# Patient Record
Sex: Male | Born: 1960 | Race: Black or African American | Hispanic: No | Smoking: Heavy tobacco smoker
Health system: Southern US, Community
[De-identification: ages and names within clinical notes are randomized; demographics above are authoritative.]

## PROBLEM LIST (undated history)

## (undated) DIAGNOSIS — I219 Acute myocardial infarction, unspecified: Secondary | ICD-10-CM

## (undated) DIAGNOSIS — E785 Hyperlipidemia, unspecified: Secondary | ICD-10-CM

## (undated) DIAGNOSIS — I739 Peripheral vascular disease, unspecified: Secondary | ICD-10-CM

## (undated) DIAGNOSIS — B1920 Unspecified viral hepatitis C without hepatic coma: Secondary | ICD-10-CM

## (undated) DIAGNOSIS — I5020 Unspecified systolic (congestive) heart failure: Secondary | ICD-10-CM

## (undated) DIAGNOSIS — I10 Essential (primary) hypertension: Secondary | ICD-10-CM

## (undated) DIAGNOSIS — E119 Type 2 diabetes mellitus without complications: Secondary | ICD-10-CM

## (undated) HISTORY — PX: ABSCESS DRAINAGE: SHX1119

---

## 2017-08-30 ENCOUNTER — Emergency Department
Admission: EM | Admit: 2017-08-30 | Discharge: 2017-08-31 | Disposition: A | Discharge status: Home / Self Care | Payer: Medicaid Other | Attending: Emergency Medicine | Admitting: Emergency Medicine

## 2017-08-30 DIAGNOSIS — E119 Type 2 diabetes mellitus without complications: Secondary | ICD-10-CM | POA: Diagnosis not present

## 2017-08-30 DIAGNOSIS — T401X1A Poisoning by heroin, accidental (unintentional), initial encounter: Secondary | ICD-10-CM

## 2017-08-30 HISTORY — DX: Type 2 diabetes mellitus without complications: E11.9

## 2017-08-30 NOTE — ED Triage Notes (Signed)
Pt arrived to the ED via EMS from home for a heroin overdose. According to EMS they got to the Pt's house and they found the Pt unresponsive, after receiving Narcan the Pt became responsive. Upon arrival to the hospital the Pt admitted to Dr. Owens Shark that he "snorred heroin" pt is AOx4 somnolent.

## 2017-08-31 ENCOUNTER — Other Ambulatory Visit: Payer: Self-pay

## 2017-08-31 ENCOUNTER — Encounter: Payer: Self-pay | Admitting: Emergency Medicine

## 2017-08-31 LAB — COMPREHENSIVE METABOLIC PANEL
ALT: 20 U/L (ref 0–44)
AST: 32 U/L (ref 15–41)
Albumin: 4.4 g/dL (ref 3.5–5.0)
Alkaline Phosphatase: 170 U/L — ABNORMAL HIGH (ref 38–126)
Anion gap: 9 (ref 5–15)
BUN: 23 mg/dL — ABNORMAL HIGH (ref 6–20)
CO2: 23 mmol/L (ref 22–32)
Calcium: 8.9 mg/dL (ref 8.9–10.3)
Chloride: 102 mmol/L (ref 98–111)
Creatinine, Ser: 1.71 mg/dL — ABNORMAL HIGH (ref 0.61–1.24)
GFR calc Af Amer: 50 mL/min — ABNORMAL LOW (ref >60)
GFR calc non Af Amer: 43 mL/min — ABNORMAL LOW (ref >60)
Glucose, Bld: 548 mg/dL (ref 70–99)
Potassium: 5.9 mmol/L — ABNORMAL HIGH (ref 3.5–5.1)
Sodium: 134 mmol/L — ABNORMAL LOW (ref 135–145)
Total Bilirubin: 1 mg/dL (ref 0.3–1.2)
Total Protein: 9.3 g/dL — ABNORMAL HIGH (ref 6.5–8.1)

## 2017-08-31 LAB — CBC
HEMATOCRIT: 44.8 % (ref 40.0–52.0)
HEMOGLOBIN: 14.2 g/dL (ref 13.0–18.0)
MCH: 26.2 pg (ref 26.0–34.0)
MCHC: 31.7 g/dL — ABNORMAL LOW (ref 32.0–36.0)
MCV: 82.7 fL (ref 80.0–100.0)
Platelets: 247 10*3/uL (ref 150–440)
RBC: 5.43 MIL/uL (ref 4.40–5.90)
RDW: 15.7 % — ABNORMAL HIGH (ref 11.5–14.5)
WBC: 11.5 10*3/uL — ABNORMAL HIGH (ref 3.8–10.6)

## 2017-08-31 LAB — GLUCOSE, CAPILLARY
Glucose-Capillary: 345 mg/dL — ABNORMAL HIGH (ref 70–99)
Glucose-Capillary: 401 mg/dL — ABNORMAL HIGH (ref 70–99)
Glucose-Capillary: 541 mg/dL (ref 70–99)

## 2017-08-31 MED ORDER — ONDANSETRON HCL 4 MG/2ML IJ SOLN
INTRAMUSCULAR | Status: AC
  Filled 2017-08-31: qty 2

## 2017-08-31 MED ORDER — INSULIN ASPART 100 UNIT/ML ~~LOC~~ SOLN
10.0000 [IU] | Freq: Once | SUBCUTANEOUS | Status: AC
  Administered 2017-08-31: 10 [IU] via SUBCUTANEOUS
  Filled 2017-08-31: qty 1

## 2017-08-31 MED ORDER — NALOXONE HCL 2 MG/2ML IJ SOSY
PREFILLED_SYRINGE | INTRAMUSCULAR | Status: AC
  Filled 2017-08-31: qty 2

## 2017-08-31 MED ORDER — SODIUM CHLORIDE 0.9 % IV BOLUS
1000.0000 mL | Freq: Once | INTRAVENOUS | Status: AC
  Administered 2017-08-31: 1000 mL via INTRAVENOUS

## 2017-08-31 MED ORDER — ONDANSETRON HCL 4 MG/2ML IJ SOLN: 4.0000 mg | Freq: Once | INTRAMUSCULAR | Status: DC

## 2017-08-31 NOTE — ED Provider Notes (Signed)
[facility] Emergency Department Provider Note      I have reviewed the triage vital signs and the nursing notes.   HISTORY  Chief Complaint Drug Overdose    HPI Daniel Moon is a 57 y.o. male [name] to the emergency department via EMS.  EMS states that the call was for cardiac arrest on their arrival patient was found to be unresponsive laying supine with respiratory rate of 6 pinpoint pupils.  Patient however did have a regular pulse.  EMS administered intranasal Narcan.  Following Narcan administration patient became alert oriented.  Patient initially told EMS that he does not recall anything prior to losing consciousness stating that he just came home from work.  However during my evaluation patient admitted to snorting heroin after arriving home from work.  Past medical history Diabetes mellitus There are no active problems to display for this patient.     Prior to Admission medications   Not on File    Allergies No known No family history on file.  Social History Social History   Tobacco Use  . Smoking status: Not on file  Substance Use Topics  . Alcohol use: Not on file  . Drug use: Not on file    Review of Systems Constitutional: No fever/chills Eyes: No visual changes. ENT: No sore throat. Cardiovascular: Denies chest pain. Respiratory: Denies shortness of breath. Gastrointestinal: No abdominal pain.  No nausea, no vomiting.  No diarrhea.  No constipation. Genitourinary: Negative for dysuria. Musculoskeletal: Negative for neck pain.  Negative for back pain. Integumentary: Negative for rash. Neurological: Negative for headaches, focal weakness or numbness.   ____________________________________________   PHYSICAL EXAM:  VITAL SIGNS: ED Triage Vitals  Enc Vitals Group     BP      Pulse      Resp      Temp      Temp src      SpO2      Weight      Height      Head Circumference      Peak Flow      Pain  Score      Pain Loc      Pain Edu?      Excl. in [place]?     Constitutional: Alert and oriented. Well appearing and in no acute distress. Eyes: Conjunctivae are normal.  Point pupils bilaterally  head: Atraumatic. Mouth/Throat: Mucous membranes are moist. Oropharynx non-erythematous. Neck: No stridor.  No meningeal signs.   Cardiovascular: Normal rate, regular rhythm. Good peripheral circulation. Grossly normal heart sounds. Respiratory: Normal respiratory effort.  No retractions. Lungs CTAB. Gastrointestinal: Soft and nontender. No distention.  Musculoskeletal: No lower extremity tenderness nor edema. No gross deformities of extremities. Neurologic:  Normal speech and language. No gross focal neurologic deficits are appreciated.  Skin:  Skin is warm, dry and intact. No rash noted. Psychiatric: Mood and affect are normal. Speech and behavior are normal.  ____________________________________________   LABS (all labs ordered are listed, but only abnormal results are displayed)  Labs Reviewed  GLUCOSE, CAPILLARY - Abnormal; Notable for the following components:      Result Value   Glucose-Capillary 541 (*)    All other components within normal limits   ____________________________________________  EKG  ED ECG REPORT I, [place], the attending physician, personally viewed and interpreted this ECG.   Date: 08/31/2017  EKG Time: 50 9 PM  Rate: 85  Rhythm: Normal sinus rhythm  Axis: Normal  Intervals: Normal  ST&T Change: None   Procedures   ____________________________________________   INITIAL IMPRESSION / ASSESSMENT AND PLAN / ED COURSE  As part of my medical decision making, I reviewed the following data within the electronic MEDICAL RECORD NUMBER   57 year old male presented with above-stated history and physical exam secondary to accidental heroin overdose.  Patient was given Narcan by EMS before arrival to the emergency department as stated.  Patient remained  stable throughout 6-hour ED observation with no episodes of hypoxia.  Patient is alert and oriented x4 at this time.  Spoke with the patient at length regarding detox which the patient refused at this time.    ____________________________________________  FINAL CLINICAL IMPRESSION(S) / ED DIAGNOSES  Final diagnoses:  Accidental overdose of heroin, initial encounter [facility])     MEDICATIONS GIVEN DURING THIS VISIT:  Medications - No data to display   ED Discharge Orders    None       Note:  This document was prepared using Dragon voice recognition software and may include unintentional dictation errors.    Gregor Hams, MD 08/31/17 [phone]

## 2018-02-22 ENCOUNTER — Emergency Department: Payer: Medicaid Other

## 2018-02-22 ENCOUNTER — Inpatient Hospital Stay
Admission: EM | Admit: 2018-02-22 | Discharge: 2018-02-24 | DRG: 193 | Disposition: A | Discharge status: Home / Self Care | Payer: Medicaid Other | Attending: Internal Medicine | Admitting: Internal Medicine

## 2018-02-22 ENCOUNTER — Other Ambulatory Visit: Payer: Self-pay

## 2018-02-22 DIAGNOSIS — E119 Type 2 diabetes mellitus without complications: Secondary | ICD-10-CM

## 2018-02-22 DIAGNOSIS — J188 Other pneumonia, unspecified organism: Principal | ICD-10-CM | POA: Diagnosis present

## 2018-02-22 DIAGNOSIS — I110 Hypertensive heart disease with heart failure: Secondary | ICD-10-CM | POA: Diagnosis present

## 2018-02-22 DIAGNOSIS — J440 Chronic obstructive pulmonary disease with acute lower respiratory infection: Secondary | ICD-10-CM | POA: Diagnosis present

## 2018-02-22 DIAGNOSIS — J449 Chronic obstructive pulmonary disease, unspecified: Secondary | ICD-10-CM | POA: Diagnosis present

## 2018-02-22 DIAGNOSIS — R7989 Other specified abnormal findings of blood chemistry: Secondary | ICD-10-CM

## 2018-02-22 DIAGNOSIS — E871 Hypo-osmolality and hyponatremia: Secondary | ICD-10-CM | POA: Diagnosis present

## 2018-02-22 DIAGNOSIS — Z833 Family history of diabetes mellitus: Secondary | ICD-10-CM

## 2018-02-22 DIAGNOSIS — N170 Acute kidney failure with tubular necrosis: Secondary | ICD-10-CM | POA: Diagnosis present

## 2018-02-22 DIAGNOSIS — E875 Hyperkalemia: Secondary | ICD-10-CM | POA: Diagnosis not present

## 2018-02-22 DIAGNOSIS — R0902 Hypoxemia: Secondary | ICD-10-CM

## 2018-02-22 DIAGNOSIS — J9602 Acute respiratory failure with hypercapnia: Secondary | ICD-10-CM | POA: Diagnosis present

## 2018-02-22 DIAGNOSIS — Z794 Long term (current) use of insulin: Secondary | ICD-10-CM

## 2018-02-22 DIAGNOSIS — I252 Old myocardial infarction: Secondary | ICD-10-CM

## 2018-02-22 DIAGNOSIS — I10 Essential (primary) hypertension: Secondary | ICD-10-CM | POA: Diagnosis present

## 2018-02-22 DIAGNOSIS — E785 Hyperlipidemia, unspecified: Secondary | ICD-10-CM | POA: Diagnosis present

## 2018-02-22 DIAGNOSIS — J189 Pneumonia, unspecified organism: Secondary | ICD-10-CM

## 2018-02-22 DIAGNOSIS — Z23 Encounter for immunization: Secondary | ICD-10-CM

## 2018-02-22 DIAGNOSIS — Z8249 Family history of ischemic heart disease and other diseases of the circulatory system: Secondary | ICD-10-CM

## 2018-02-22 DIAGNOSIS — Z79899 Other long term (current) drug therapy: Secondary | ICD-10-CM

## 2018-02-22 DIAGNOSIS — E1165 Type 2 diabetes mellitus with hyperglycemia: Secondary | ICD-10-CM | POA: Diagnosis present

## 2018-02-22 DIAGNOSIS — F17210 Nicotine dependence, cigarettes, uncomplicated: Secondary | ICD-10-CM | POA: Diagnosis present

## 2018-02-22 DIAGNOSIS — J441 Chronic obstructive pulmonary disease with (acute) exacerbation: Secondary | ICD-10-CM | POA: Diagnosis present

## 2018-02-22 DIAGNOSIS — E872 Acidosis: Secondary | ICD-10-CM | POA: Diagnosis present

## 2018-02-22 DIAGNOSIS — I5022 Chronic systolic (congestive) heart failure: Secondary | ICD-10-CM | POA: Diagnosis present

## 2018-02-22 DIAGNOSIS — J9601 Acute respiratory failure with hypoxia: Secondary | ICD-10-CM | POA: Diagnosis present

## 2018-02-22 HISTORY — DX: Unspecified systolic (congestive) heart failure: I50.20

## 2018-02-22 HISTORY — DX: Hyperlipidemia, unspecified: E78.5

## 2018-02-22 HISTORY — DX: Acute myocardial infarction, unspecified: I21.9

## 2018-02-22 HISTORY — DX: Essential (primary) hypertension: I10

## 2018-02-22 LAB — BASIC METABOLIC PANEL
ANION GAP: 8 (ref 5–15)
BUN: 18 mg/dL (ref 6–20)
CO2: 20 mmol/L — AB (ref 22–32)
Calcium: 8.4 mg/dL — ABNORMAL LOW (ref 8.9–10.3)
Chloride: 107 mmol/L (ref 98–111)
Creatinine, Ser: 1.55 mg/dL — ABNORMAL HIGH (ref 0.61–1.24)
GFR calc Af Amer: 57 mL/min — ABNORMAL LOW (ref >60)
GFR calc non Af Amer: 49 mL/min — ABNORMAL LOW (ref >60)
Glucose, Bld: 169 mg/dL — ABNORMAL HIGH (ref 70–99)
POTASSIUM: 4.9 mmol/L (ref 3.5–5.1)
Sodium: 135 mmol/L (ref 135–145)

## 2018-02-22 LAB — BLOOD GAS, VENOUS
Acid-base deficit: 2.2 mmol/L — ABNORMAL HIGH (ref 0.0–2.0)
Bicarbonate: 22.4 mmol/L (ref 20.0–28.0)
O2 Saturation: 74.3 %
Patient temperature: 37
pCO2, Ven: 37 mmHg — ABNORMAL LOW (ref 44.0–60.0)
pH, Ven: 7.39 (ref 7.250–7.430)
pO2, Ven: 40 mmHg (ref 32.0–45.0)

## 2018-02-22 LAB — CBC
HEMATOCRIT: 38.5 % — AB (ref 39.0–52.0)
HEMOGLOBIN: 12.4 g/dL — AB (ref 13.0–17.0)
MCH: 25.7 pg — AB (ref 26.0–34.0)
MCHC: 32.2 g/dL (ref 30.0–36.0)
MCV: 79.9 fL — AB (ref 80.0–100.0)
Platelets: 267 10*3/uL (ref 150–400)
RBC: 4.82 MIL/uL (ref 4.22–5.81)
RDW: 14.4 % (ref 11.5–15.5)
WBC: 8.9 10*3/uL (ref 4.0–10.5)
nRBC: 0 % (ref 0.0–0.2)

## 2018-02-22 LAB — BRAIN NATRIURETIC PEPTIDE: B Natriuretic Peptide: 592 pg/mL — ABNORMAL HIGH (ref 0.0–100.0)

## 2018-02-22 LAB — TROPONIN I: Troponin I: <0.03 ng/mL (ref <0.03)

## 2018-02-22 LAB — LACTIC ACID, PLASMA: LACTIC ACID, VENOUS: 1.3 mmol/L (ref 0.5–1.9)

## 2018-02-22 LAB — INFLUENZA PANEL BY PCR (TYPE A & B)
INFLBPCR: NEGATIVE
Influenza A By PCR: NEGATIVE

## 2018-02-22 MED ORDER — SODIUM CHLORIDE 0.9 % IV SOLN
1.0000 g | Freq: Once | INTRAVENOUS | Status: AC
  Administered 2018-02-22: 1 g via INTRAVENOUS
  Filled 2018-02-22: qty 10

## 2018-02-22 MED ORDER — KETOROLAC TROMETHAMINE 30 MG/ML IJ SOLN
30.0000 mg | Freq: Once | INTRAMUSCULAR | Status: AC
  Administered 2018-02-22: 30 mg via INTRAVENOUS
  Filled 2018-02-22: qty 1

## 2018-02-22 MED ORDER — METHYLPREDNISOLONE SODIUM SUCC 125 MG IJ SOLR
125.0000 mg | Freq: Once | INTRAMUSCULAR | Status: AC
  Administered 2018-02-22: 125 mg via INTRAVENOUS
  Filled 2018-02-22: qty 2

## 2018-02-22 MED ORDER — SODIUM CHLORIDE 0.9 % IV BOLUS
1000.0000 mL | Freq: Once | INTRAVENOUS | Status: AC
  Administered 2018-02-23: 1000 mL via INTRAVENOUS

## 2018-02-22 MED ORDER — SODIUM CHLORIDE 0.9 % IV BOLUS
1000.0000 mL | Freq: Once | INTRAVENOUS | Status: AC
  Administered 2018-02-22: 1000 mL via INTRAVENOUS

## 2018-02-22 MED ORDER — ONDANSETRON HCL 4 MG/2ML IJ SOLN
4.0000 mg | Freq: Once | INTRAMUSCULAR | Status: AC
  Administered 2018-02-22: 4 mg via INTRAVENOUS
  Filled 2018-02-22: qty 2

## 2018-02-22 MED ORDER — SODIUM CHLORIDE 0.9 % IV SOLN
500.0000 mg | Freq: Once | INTRAVENOUS | Status: AC
  Administered 2018-02-23: 500 mg via INTRAVENOUS
  Filled 2018-02-22 (×3): qty 500

## 2018-02-22 MED ORDER — IPRATROPIUM-ALBUTEROL 0.5-2.5 (3) MG/3ML IN SOLN
3.0000 mL | Freq: Once | RESPIRATORY_TRACT | Status: AC
  Administered 2018-02-22: 3 mL via RESPIRATORY_TRACT
  Filled 2018-02-22: qty 3

## 2018-02-22 NOTE — ED Notes (Signed)
Pt brought in via ems from home with cough, sob, n/v.  Sx since yesterday.  cig smoker.  No abd pain.  Pt denies chest pain.  No diaphoresis.  Pt reports aching all over.  Family with pt.

## 2018-02-22 NOTE — ED Triage Notes (Signed)
Pt arrived via [place] [facility] from home with c/o N/V/D and cough since yesterday. EMS states that pt had some increasing SHOB today with o2 on RA of 91%. EMS states that pt has been anxious and worried about breathing. Pt on 92% on RA on arrival put on 2L [place].

## 2018-02-22 NOTE — ED Notes (Signed)
Ambulated with oxygen sats in 80's on room air.

## 2018-02-22 NOTE — ED Notes (Signed)
Iv started and meds given.  Pt was restless and yelling out in pain.  Pt became diaphoretic.  md aware.  Oxygen sats low on 3 liters at 81%  md aware.

## 2018-02-22 NOTE — ED Provider Notes (Signed)
-----------------------------------------   11:51 PM on 02/22/2018 -----------------------------------------  On nasal cannula oxygen patient's oxygenation is 81%.  Will place on BiPAP.   ----------------------------------------- 5:48 AM on 02/23/2018 -----------------------------------------  Patient remains in the ED, boarded secondary to no stepdown beds.  No further events.  Doing well on BiPAP.    Paulette Blanch, MD 02/23/18 [phone]

## 2018-02-22 NOTE — ED Provider Notes (Signed)
[facility] Emergency Department Provider Note  ____________________________________________  Time seen: Approximately 10:56 PM  I have reviewed the triage vital signs and the nursing notes.   HISTORY  Chief Complaint Emesis; Nausea; Cough; and Shortness of Breath    HPI Marky Buresh is a 58 y.o. male with DM and tobacco abuse presenting w/ cough and SOB.  Patient reports that for the past 3 days he has had a dry cough with congestion and rhinorrhea, and his most bothered by "the pain in my bones."  He reports that he becomes quite dyspneic with exertion.  He denies any chest pain, lightheadedness or fainting, fever.  He did not have his influenza vaccination this year, and has had some episodes of nausea and vomiting with diarrhea.  Past Medical History:  Diagnosis Date  . Diabetes mellitus without complication ([place])     There are no active problems to display for this patient.   History reviewed. No pertinent surgical history.  Current Outpatient Rx  . Order #: [id number] Class: Historical Med  . Order #: [id number] Class: Historical Med  . Order #: [id number] Class: Historical Med  . Order #: [id number] Class: Historical Med  . Order #: [id number] Class: Historical Med  . Order #: [id number] Class: Historical Med  . Order #: [id number] Class: Historical Med  . Order #: [id number] Class: Historical Med  . Order #: [id number] Class: Historical Med  . Order #: [id number] Class: Historical Med    Allergies Patient has no known allergies.  History reviewed. No pertinent family history.  Social History Social History   Tobacco Use  . Smoking status: Heavy Tobacco Smoker  . Smokeless tobacco: Never Used  Substance Use Topics  . Alcohol use: Yes  . Drug use: Yes    Comment: Heroin    Review of Systems Constitutional: No fever/chills.  Lightheadedness or syncope.  Positive myalgias. Eyes: No visual changes.  No eye discharge. ENT: No sore throat.  +congestion +rhinorrhea. Cardiovascular: Denies chest pain. Denies palpitations. Respiratory: Positive exertional shortness of breath.  Positive cough. Gastrointestinal: No abdominal pain.  Positive nausea, positive vomiting.  Positive diarrhea.  No constipation. Genitourinary: Negative for dysuria. Musculoskeletal: Negative for back pain. Skin: Negative for rash. Neurological: Negative for headaches. No focal numbness, tingling or weakness.     ____________________________________________   PHYSICAL EXAM:  VITAL SIGNS: ED Triage Vitals  Enc Vitals Group     BP 02/22/18 2154 (!) 138/98     Pulse Rate 02/22/18 2154 87     Resp 02/22/18 2158 (!) 26     Temp 02/22/18 2158 98.4 F (36.9 C)     Temp Source 02/22/18 2158 Oral     SpO2 02/22/18 2154 92 %     Weight 02/22/18 2159 290 lb (131.5 kg)     Height 02/22/18 2159 6\' 3"  (1.905 m)     Head Circumference --      Peak Flow --      Pain Score 02/22/18 2159 10     Pain Loc --      Pain Edu? --      Excl. in [place]? --     Constitutional: Alert and oriented.  Answers questions appropriately. Uncomfortable appearing. Eyes: Conjunctivae are normal.  EOMI. No scleral icterus. Head: Atraumatic. Nose: +congestion without active rhinnorhea. Mouth/Throat: Mucous membranes are moist.  Neck: No stridor.  Supple.  No JVD.  No meningismus. Cardiovascular: Normal rate, regular rhythm. No murmurs, rubs or gallops.  Respiratory: The patient is tachypneic with mild accessory muscle use but no  retractions.  He is able to speak in full sentences without significant difficulty.  He has rales in the lung bases bilaterally and end expiratory wheezing.  His O2 sats are 95% on 2 L nasal cannula, but go down to 88% with ambulation off of oxygen. Gastrointestinal: Soft, nontender and nondistended.  No guarding or rebound.  No peritoneal signs. Musculoskeletal: No LE edema. No ttp in the calves or palpable cords.  Negative Homan's sign. Neurologic:   A&Ox3.  Speech is clear.  Face and smile are symmetric.  EOMI.  Moves all extremities well. Skin:  Skin is warm, dry and intact. No rash noted. Psychiatric: Mood and affect are normal. Speech and behavior are normal.  Normal judgement.  ____________________________________________   LABS (all labs ordered are listed, but only abnormal results are displayed)  Labs Reviewed  CBC - Abnormal; Notable for the following components:      Result Value   Hemoglobin 12.4 (*)    HCT 38.5 (*)    MCV 79.9 (*)    MCH 25.7 (*)    All other components within normal limits  CULTURE, BLOOD (ROUTINE X 2)  CULTURE, BLOOD (ROUTINE X 2)  BASIC METABOLIC PANEL  TROPONIN I  BRAIN NATRIURETIC PEPTIDE  INFLUENZA PANEL BY PCR (TYPE A & B)  BLOOD GAS, VENOUS  LACTIC ACID, PLASMA  LACTIC ACID, PLASMA   ____________________________________________  EKG  ED ECG REPORT I, Anne-Caroline Mariea Clonts, the attending physician, personally viewed and interpreted this ECG.   Date: 02/22/2018  EKG Time: 2159  Rate: 85  Rhythm: normal sinus rhythm  Axis: normal  Intervals:none  ST&T Change: No STEMI  ____________________________________________  RADIOLOGY  Dg Chest 2 View  Result Date: 02/22/2018 CLINICAL DATA:  58 year old male with increasing shortness of breath, cough. EXAM: CHEST - 2 VIEW COMPARISON:  Chest radiographs 09/30/2012. FINDINGS: Abnormal opacity in the left lung appears to be multifocal, both about the left hilum (rounded contour) and in a peribronchial pattern of the left lower lobe. Lower lung volumes. More subtle increased opacity about the right hilum appears more indeterminate, might be atelectasis. No definite pleural effusion. No pneumothorax. No pulmonary edema suspected. Mediastinal contours remain normal. Visualized tracheal air column is within normal limits. No acute osseous abnormality identified. Negative visible bowel gas pattern. IMPRESSION: 1. Multifocal opacity in the left lung,  configuration favoring multilobar pneumonia. Questionable involvement also in the right lung. No pleural effusion identified. 2. Followup PA and lateral chest X-ray is recommended in 3-4 weeks following trial of antibiotic therapy to ensure resolution and exclude underlying malignancy. Electronically Signed   By: Genevie Ann M.D.   On: 02/22/2018 22:41    ____________________________________________   PROCEDURES  Procedure(s) performed: None  Procedures  Critical Care performed: No ____________________________________________   INITIAL IMPRESSION / ASSESSMENT AND PLAN / ED COURSE  Pertinent labs & imaging results that were available during my care of the patient were reviewed by me and considered in my medical decision making (see chart for details).  58 y.o. male DM and tobacco abuse presenting for shortness of breath with cough, myalgias, nausea vomiting and diarrhea.  Overall, the patient symptoms are most consistent with a influenza-like illness, but given his abnormal pulmonary exam I am concerned about pneumonia.  He is hypoxic and will need admission.  The patient will receive immediate empiric antibiotics.  He is stable on supplemental oxygen.  CRITICAL CARE Performed by: Eula Listen   Total critical care time: 35 minutes  Critical care time was  exclusive of separately billable procedures and treating other patients.  Critical care was necessary to treat or prevent imminent or life-threatening deterioration.  Critical care was time spent personally by me on the following activities: development of treatment plan with patient and/or surrogate as well as nursing, discussions with consultants, evaluation of patient's response to treatment, examination of patient, obtaining history from patient or surrogate, ordering and performing treatments and interventions, ordering and review of laboratory studies, ordering and review of radiographic studies, pulse oximetry and  re-evaluation of patient's condition.   ____________________________________________  FINAL CLINICAL IMPRESSION(S) / ED DIAGNOSES  Final diagnoses:  Hypoxia  Multifocal pneumonia         NEW MEDICATIONS STARTED DURING THIS VISIT:  New Prescriptions   No medications on file      Eula Listen, MD 02/22/18 2303

## 2018-02-23 ENCOUNTER — Encounter: Payer: Self-pay | Admitting: Internal Medicine

## 2018-02-23 DIAGNOSIS — J188 Other pneumonia, unspecified organism: Secondary | ICD-10-CM | POA: Diagnosis present

## 2018-02-23 DIAGNOSIS — R0902 Hypoxemia: Secondary | ICD-10-CM | POA: Diagnosis present

## 2018-02-23 DIAGNOSIS — I110 Hypertensive heart disease with heart failure: Secondary | ICD-10-CM | POA: Diagnosis present

## 2018-02-23 DIAGNOSIS — E875 Hyperkalemia: Secondary | ICD-10-CM | POA: Diagnosis not present

## 2018-02-23 DIAGNOSIS — I5022 Chronic systolic (congestive) heart failure: Secondary | ICD-10-CM | POA: Diagnosis present

## 2018-02-23 DIAGNOSIS — Z794 Long term (current) use of insulin: Secondary | ICD-10-CM | POA: Diagnosis not present

## 2018-02-23 DIAGNOSIS — F17210 Nicotine dependence, cigarettes, uncomplicated: Secondary | ICD-10-CM | POA: Diagnosis present

## 2018-02-23 DIAGNOSIS — Z79899 Other long term (current) drug therapy: Secondary | ICD-10-CM | POA: Diagnosis not present

## 2018-02-23 DIAGNOSIS — I10 Essential (primary) hypertension: Secondary | ICD-10-CM | POA: Diagnosis present

## 2018-02-23 DIAGNOSIS — N170 Acute kidney failure with tubular necrosis: Secondary | ICD-10-CM | POA: Diagnosis present

## 2018-02-23 DIAGNOSIS — J9601 Acute respiratory failure with hypoxia: Secondary | ICD-10-CM | POA: Diagnosis present

## 2018-02-23 DIAGNOSIS — J189 Pneumonia, unspecified organism: Secondary | ICD-10-CM | POA: Diagnosis present

## 2018-02-23 DIAGNOSIS — E785 Hyperlipidemia, unspecified: Secondary | ICD-10-CM | POA: Diagnosis present

## 2018-02-23 DIAGNOSIS — J9602 Acute respiratory failure with hypercapnia: Secondary | ICD-10-CM | POA: Diagnosis present

## 2018-02-23 DIAGNOSIS — Z23 Encounter for immunization: Secondary | ICD-10-CM | POA: Diagnosis not present

## 2018-02-23 DIAGNOSIS — J441 Chronic obstructive pulmonary disease with (acute) exacerbation: Secondary | ICD-10-CM | POA: Diagnosis present

## 2018-02-23 DIAGNOSIS — I252 Old myocardial infarction: Secondary | ICD-10-CM | POA: Diagnosis not present

## 2018-02-23 DIAGNOSIS — Z833 Family history of diabetes mellitus: Secondary | ICD-10-CM | POA: Diagnosis not present

## 2018-02-23 DIAGNOSIS — J449 Chronic obstructive pulmonary disease, unspecified: Secondary | ICD-10-CM | POA: Diagnosis present

## 2018-02-23 DIAGNOSIS — J440 Chronic obstructive pulmonary disease with acute lower respiratory infection: Secondary | ICD-10-CM | POA: Diagnosis present

## 2018-02-23 DIAGNOSIS — E872 Acidosis: Secondary | ICD-10-CM | POA: Diagnosis present

## 2018-02-23 DIAGNOSIS — Z8249 Family history of ischemic heart disease and other diseases of the circulatory system: Secondary | ICD-10-CM | POA: Diagnosis not present

## 2018-02-23 DIAGNOSIS — E1165 Type 2 diabetes mellitus with hyperglycemia: Secondary | ICD-10-CM | POA: Diagnosis present

## 2018-02-23 DIAGNOSIS — E871 Hypo-osmolality and hyponatremia: Secondary | ICD-10-CM | POA: Diagnosis present

## 2018-02-23 DIAGNOSIS — E119 Type 2 diabetes mellitus without complications: Secondary | ICD-10-CM

## 2018-02-23 LAB — CBC
HEMATOCRIT: 39 % (ref 39.0–52.0)
Hemoglobin: 12.4 g/dL — ABNORMAL LOW (ref 13.0–17.0)
MCH: 25.6 pg — ABNORMAL LOW (ref 26.0–34.0)
MCHC: 31.8 g/dL (ref 30.0–36.0)
MCV: 80.4 fL (ref 80.0–100.0)
Platelets: 271 10*3/uL (ref 150–400)
RBC: 4.85 MIL/uL (ref 4.22–5.81)
RDW: 14.2 % (ref 11.5–15.5)
WBC: 8.4 10*3/uL (ref 4.0–10.5)
nRBC: 0 % (ref 0.0–0.2)

## 2018-02-23 LAB — BASIC METABOLIC PANEL
Anion gap: 8 (ref 5–15)
BUN: 21 mg/dL — ABNORMAL HIGH (ref 6–20)
CO2: 20 mmol/L — AB (ref 22–32)
Calcium: 8 mg/dL — ABNORMAL LOW (ref 8.9–10.3)
Chloride: 105 mmol/L (ref 98–111)
Creatinine, Ser: 1.71 mg/dL — ABNORMAL HIGH (ref 0.61–1.24)
GFR calc Af Amer: 51 mL/min — ABNORMAL LOW (ref >60)
GFR calc non Af Amer: 44 mL/min — ABNORMAL LOW (ref >60)
Glucose, Bld: 295 mg/dL — ABNORMAL HIGH (ref 70–99)
Potassium: 5.5 mmol/L — ABNORMAL HIGH (ref 3.5–5.1)
Sodium: 133 mmol/L — ABNORMAL LOW (ref 135–145)

## 2018-02-23 LAB — GLUCOSE, CAPILLARY
GLUCOSE-CAPILLARY: 232 mg/dL — AB (ref 70–99)
Glucose-Capillary: 272 mg/dL — ABNORMAL HIGH (ref 70–99)
Glucose-Capillary: 268 mg/dL — ABNORMAL HIGH (ref 70–99)
Glucose-Capillary: 323 mg/dL — ABNORMAL HIGH (ref 70–99)
Glucose-Capillary: 294 mg/dL — ABNORMAL HIGH (ref 70–99)
Glucose-Capillary: 232 mg/dL — ABNORMAL HIGH (ref 70–99)

## 2018-02-23 LAB — BLOOD GAS, ARTERIAL
Acid-base deficit: 2.9 mmol/L — ABNORMAL HIGH (ref 0.0–2.0)
BICARBONATE: 22.6 mmol/L (ref 20.0–28.0)
Delivery systems: POSITIVE
Expiratory PAP: 6
FIO2: 0.4
INSPIRATORY PAP: 12
O2 Saturation: 90.1 %
PO2 ART: 62 mmHg — AB (ref 83.0–108.0)
Patient temperature: 37
pCO2 arterial: 41 mmHg (ref 32.0–48.0)
pH, Arterial: 7.35 (ref 7.350–7.450)

## 2018-02-23 LAB — PROCALCITONIN

## 2018-02-23 LAB — POTASSIUM: Potassium: 4.7 mmol/L (ref 3.5–5.1)

## 2018-02-23 LAB — MRSA PCR SCREENING: MRSA by PCR: NEGATIVE

## 2018-02-23 LAB — LACTIC ACID, PLASMA: Lactic Acid, Venous: 1.2 mmol/L (ref 0.5–1.9)

## 2018-02-23 MED ORDER — BENZONATATE 100 MG PO CAPS: 200.0000 mg | ORAL_CAPSULE | Freq: Three times a day (TID) | ORAL | Status: DC | PRN

## 2018-02-23 MED ORDER — CARVEDILOL 3.125 MG PO TABS
12.5000 mg | ORAL_TABLET | Freq: Two times a day (BID) | ORAL | Status: DC
  Administered 2018-02-24: 08:00:00 12.5 mg via ORAL
  Filled 2018-02-23: qty 4

## 2018-02-23 MED ORDER — CHLORHEXIDINE GLUCONATE 0.12 % MT SOLN
15.0000 mL | Freq: Two times a day (BID) | OROMUCOSAL | Status: DC
  Administered 2018-02-23 (×2): 15 mL via OROMUCOSAL
  Filled 2018-02-23 (×2): qty 15

## 2018-02-23 MED ORDER — SODIUM ZIRCONIUM CYCLOSILICATE 5 G PO PACK
10.0000 g | PACK | Freq: Once | ORAL | Status: AC
  Administered 2018-02-23: 10 g via ORAL
  Filled 2018-02-23: qty 2

## 2018-02-23 MED ORDER — GUAIFENESIN-DM 100-10 MG/5ML PO SYRP
5.0000 mL | ORAL_SOLUTION | ORAL | Status: DC | PRN
  Administered 2018-02-23: 22:00:00 5 mL via ORAL
  Filled 2018-02-23 (×2): qty 5

## 2018-02-23 MED ORDER — AZITHROMYCIN 500 MG PO TABS
250.0000 mg | ORAL_TABLET | Freq: Every day | ORAL | Status: DC
  Administered 2018-02-24: 250 mg via ORAL
  Filled 2018-02-23: qty 1

## 2018-02-23 MED ORDER — SODIUM CHLORIDE 0.9 % IV SOLN
1.0000 g | INTRAVENOUS | Status: DC
  Administered 2018-02-23: 18:00:00 1 g via INTRAVENOUS
  Filled 2018-02-23: qty 1
  Filled 2018-02-23: qty 10

## 2018-02-23 MED ORDER — SODIUM CHLORIDE 0.9 % IV SOLN
1.0000 g | INTRAVENOUS | Status: DC
  Filled 2018-02-23: qty 10

## 2018-02-23 MED ORDER — SODIUM CHLORIDE 0.9 % IV SOLN
500.0000 mg | INTRAVENOUS | Status: DC
  Filled 2018-02-23: qty 500

## 2018-02-23 MED ORDER — INSULIN ASPART 100 UNIT/ML ~~LOC~~ SOLN
0.0000 [IU] | Freq: Three times a day (TID) | SUBCUTANEOUS | Status: DC
  Administered 2018-02-23: 17:00:00 8 [IU] via SUBCUTANEOUS
  Administered 2018-02-23 (×2): 5 [IU] via SUBCUTANEOUS
  Administered 2018-02-23: 8 [IU] via SUBCUTANEOUS
  Administered 2018-02-24: 08:00:00 2 [IU] via SUBCUTANEOUS
  Filled 2018-02-23 (×5): qty 1

## 2018-02-23 MED ORDER — ACETAMINOPHEN 650 MG RE SUPP: 650.0000 mg | Freq: Four times a day (QID) | RECTAL | Status: DC | PRN

## 2018-02-23 MED ORDER — AMLODIPINE BESYLATE 10 MG PO TABS
10.0000 mg | ORAL_TABLET | Freq: Every day | ORAL | Status: DC
  Administered 2018-02-23 – 2018-02-24 (×2): 10 mg via ORAL
  Filled 2018-02-23 (×2): qty 1

## 2018-02-23 MED ORDER — IPRATROPIUM-ALBUTEROL 0.5-2.5 (3) MG/3ML IN SOLN
3.0000 mL | RESPIRATORY_TRACT | Status: DC | PRN
  Administered 2018-02-24: 08:00:00 3 mL via RESPIRATORY_TRACT
  Filled 2018-02-23: qty 3

## 2018-02-23 MED ORDER — ACETAMINOPHEN 325 MG PO TABS
650.0000 mg | ORAL_TABLET | Freq: Four times a day (QID) | ORAL | Status: DC | PRN
  Administered 2018-02-23: 650 mg via ORAL
  Filled 2018-02-23 (×3): qty 2

## 2018-02-23 MED ORDER — ATORVASTATIN CALCIUM 20 MG PO TABS
40.0000 mg | ORAL_TABLET | Freq: Every day | ORAL | Status: DC
  Administered 2018-02-23: 22:00:00 40 mg via ORAL
  Filled 2018-02-23 (×2): qty 2

## 2018-02-23 MED ORDER — INSULIN GLARGINE 100 UNIT/ML ~~LOC~~ SOLN
20.0000 [IU] | Freq: Every day | SUBCUTANEOUS | Status: DC
  Administered 2018-02-23 – 2018-02-24 (×2): 20 [IU] via SUBCUTANEOUS
  Filled 2018-02-23 (×3): qty 0.2

## 2018-02-23 MED ORDER — ONDANSETRON HCL 4 MG/2ML IJ SOLN: 4.0000 mg | Freq: Four times a day (QID) | INTRAMUSCULAR | Status: DC | PRN

## 2018-02-23 MED ORDER — INFLUENZA VAC SPLIT QUAD 0.5 ML IM SUSY
0.5000 mL | PREFILLED_SYRINGE | INTRAMUSCULAR | Status: AC
  Administered 2018-02-24: 0.5 mL via INTRAMUSCULAR
  Filled 2018-02-23: qty 0.5

## 2018-02-23 MED ORDER — PREDNISONE 20 MG PO TABS
40.0000 mg | ORAL_TABLET | Freq: Every day | ORAL | Status: DC
  Administered 2018-02-24: 40 mg via ORAL
  Filled 2018-02-23: qty 2

## 2018-02-23 MED ORDER — ORAL CARE MOUTH RINSE: 15.0000 mL | Freq: Two times a day (BID) | OROMUCOSAL | Status: DC

## 2018-02-23 MED ORDER — CLOPIDOGREL BISULFATE 75 MG PO TABS
75.0000 mg | ORAL_TABLET | Freq: Every day | ORAL | Status: DC
  Administered 2018-02-23 – 2018-02-24 (×2): 75 mg via ORAL
  Filled 2018-02-23 (×2): qty 1

## 2018-02-23 MED ORDER — ENOXAPARIN SODIUM 40 MG/0.4ML ~~LOC~~ SOLN
40.0000 mg | SUBCUTANEOUS | Status: DC
  Filled 2018-02-23 (×2): qty 0.4

## 2018-02-23 MED ORDER — METHYLPREDNISOLONE SODIUM SUCC 125 MG IJ SOLR
60.0000 mg | Freq: Four times a day (QID) | INTRAMUSCULAR | Status: DC
  Administered 2018-02-23: 60 mg via INTRAVENOUS
  Filled 2018-02-23: qty 2

## 2018-02-23 MED ORDER — ONDANSETRON HCL 4 MG PO TABS: 4.0000 mg | ORAL_TABLET | Freq: Four times a day (QID) | ORAL | Status: DC | PRN

## 2018-02-23 NOTE — Progress Notes (Signed)
Transported pt to CCU from the ED on the Bipap without incident. Total time approx. 15 mins

## 2018-02-23 NOTE — Consult Note (Signed)
CRITICAL CARE CONSULT NOTE  CC  follow up respiratory failure with a history of atrial fibrillation on warfarin, cardiac thrombus, systolic CHF previous non-STEMI, COPD, HCV, history of IV drug abuse, he came into the ED with several days of URI-like symptoms.  Was found to have x-ray with multifocal bilateral pneumonia.  He was brought to the ICU due to acute hypoxic respiratory failure requiring BiPAP.  HPI This is a pleasant 58 year old patient.   Resting in bed comfortably, no new complaints.  Overnight events noted.    SIGNIFICANT EVENTS Improved on BIPAP On nasal canula 3/l   Vitals:   02/23/18 0900 02/23/18 1000  BP: 135/67 119/75  Pulse: 88 86  Resp: (!) 27 18  Temp:    SpO2: 96% 91%     REVIEW OF SYSTEMS  10 system ROS conducted and is negative except as per HPI  PHYSICAL EXAMINATION:  GENERAL:, +resp distress HEAD: Normocephalic, atraumatic.  EYES: Pupils equal, round, reactive to light.  No scleral icterus.  MOUTH: Moist mucosal membrane. NECK: Supple. No thyromegaly. No nodules. No JVD.  PULMONARY: +rhonchi at bases CARDIOVASCULAR: S1 and S2. Regular rate and rhythm. No murmurs, rubs, or gallops.  GASTROINTESTINAL: Soft, nontender, -distended. No masses. Positive bowel sounds. No hepatosplenomegaly.  MUSCULOSKELETAL: No swelling, clubbing, or edema.  NEUROLOGIC: Mild distress due to acute illness SKIN:intact,warm,dry   Labs and Imaging:     -I personally reviewed most recent blood work, imaging and microbiology - significant findings today are hyperkalemia, hyponatremia, metabolic acidosis, acute renal injury stage II, hyperglycemia  LAB RESULTS: Recent Labs  Lab 02/22/18 2201 02/23/18 0730  NA 135 133*  K 4.9 5.5*  CL 107 105  CO2 20* 20*  BUN 18 21*  CREATININE 1.55* 1.71*  GLUCOSE 169* 295*   Recent Labs  Lab 02/22/18 2201 02/23/18 0730  HGB 12.4* 12.4*  HCT 38.5* 39.0  WBC 8.9 8.4  PLT 267 271     IMAGING RESULTS: Dg Chest 2  View  Result Date: 02/22/2018 CLINICAL DATA:  57 year old male with increasing shortness of breath, cough. EXAM: CHEST - 2 VIEW COMPARISON:  Chest radiographs 09/30/2012. FINDINGS: Abnormal opacity in the left lung appears to be multifocal, both about the left hilum (rounded contour) and in a peribronchial pattern of the left lower lobe. Lower lung volumes. More subtle increased opacity about the right hilum appears more indeterminate, might be atelectasis. No definite pleural effusion. No pneumothorax. No pulmonary edema suspected. Mediastinal contours remain normal. Visualized tracheal air column is within normal limits. No acute osseous abnormality identified. Negative visible bowel gas pattern. IMPRESSION: 1. Multifocal opacity in the left lung, configuration favoring multilobar pneumonia. Questionable involvement also in the right lung. No pleural effusion identified. 2. Followup PA and lateral chest X-ray is recommended in 3-4 weeks following trial of antibiotic therapy to ensure resolution and exclude underlying malignancy. Electronically Signed   By: Genevie Ann M.D.   On: 02/22/2018 22:41     ASSESSMENT AND PLAN SYNOPSIS  -Multidisciplinary rounds held today  Severe Hypoxic and Hypercapnic Respiratory Failure -Due to bilateral pneumonia -Proved on BiPAP -continue COPD care path -continue Bronchodilator Therapy    Renal Failure-most likely due to ATN -Gentle rehydration the IVF -follow chem 7 -follow UO -continue Foley Catheter-assess need daily -Continue Rocephin and Zithromax    GI/Nutrition GI PROPHYLAXIS as indicated DIET-->TF's as tolerated Constipation protocol as indicated  ENDO - ICU hypoglycemic\Hyperglycemia protocol -check FSBS per protocol   ELECTROLYTES -follow labs as hyperkalemia -Kayexalate -replace as  needed -pharmacy consultation   DVT/GI PRX ordered -SCDs  TRANSFUSIONS AS NEEDED MONITOR FSBS ASSESS the need for LABS as needed   Critical care  provider statement:    Critical care time (minutes):  35   Critical care time was exclusive of:  Separately billable procedures and  treating other patients   Critical care was necessary to treat or prevent imminent or  life-threatening deterioration of the following conditions:   Bilateral pneumonia, acute hypoxic respiratory failure, acute kidney injury stage II, hyperkalemia.   Critical care was time spent personally by me on the following  activities:  Development of treatment plan with patient or surrogate,  discussions with consultants, evaluation of patient's response to  treatment, examination of patient, obtaining history from patient or  surrogate, ordering and performing treatments and interventions, ordering  and review of laboratory studies and re-evaluation of patient's condition   I assumed direction of critical care for this patient from another  provider in my specialty: no     Ottie Glazier, M.D.  [facility] [place]

## 2018-02-23 NOTE — ED Notes (Signed)
Pt placed on bipap   Family with pt

## 2018-02-23 NOTE — Progress Notes (Signed)
[name] at [place] NAME: Daniel Moon    MR#:  [id number]  DATE OF BIRTH:  12/18/1960  SUBJECTIVE:  patient came in with increasing shortness of breath found to have multifocal pneumonia required temporary BiPAP. He is on 2 L nasal, oxygen feels a lot better fever.  REVIEW OF SYSTEMS:   Review of Systems  Constitutional: Negative for chills, fever and weight loss.  HENT: Negative for ear discharge, ear pain and nosebleeds.   Eyes: Negative for blurred vision, pain and discharge.  Respiratory: Positive for shortness of breath. Negative for sputum production, wheezing and stridor.   Cardiovascular: Negative for chest pain, palpitations, orthopnea and PND.  Gastrointestinal: Negative for abdominal pain, diarrhea, nausea and vomiting.  Genitourinary: Negative for frequency and urgency.  Musculoskeletal: Negative for back pain and joint pain.  Neurological: Negative for sensory change, speech change, focal weakness and weakness.  Psychiatric/Behavioral: Negative for depression and hallucinations. The patient is not nervous/anxious.    Tolerating Diet:yes  DRUG ALLERGIES:  No Known Allergies  VITALS:  Blood pressure (!) 144/88, pulse 88, temperature 99.7 F (37.6 C), temperature source Oral, resp. rate (!) 23, height 6\' 3"  (1.905 m), weight 131.5 kg, SpO2 97 %.  PHYSICAL EXAMINATION:   Physical Exam  GENERAL:  59 y.o.-year-old patient lying in the bed with no acute distress.  EYES: Pupils equal, round, reactive to light and accommodation. No scleral icterus. Extraocular muscles intact.  HEENT: Head atraumatic, normocephalic. Oropharynx and nasopharynx clear.  NECK:  Supple, no jugular venous distention. No thyroid enlargement, no tenderness.  LUNGS:distant breath sounds bilaterally, no wheezing, rales, rhonchi. No use of accessory muscles of respiration.  CARDIOVASCULAR: S1, S2 normal. No murmurs, rubs, or gallops.  ABDOMEN:  Soft, nontender, nondistended. Bowel sounds present. No organomegaly or mass.  EXTREMITIES: No cyanosis, clubbing or edema b/l.    NEUROLOGIC: Cranial nerves II through XII are intact. No focal Motor or sensory deficits b/l.   PSYCHIATRIC:  patient is alert and oriented x 3.  SKIN: No obvious rash, lesion, or ulcer.   LABORATORY PANEL:  CBC Recent Labs  Lab 02/23/18 0730  WBC 8.4  HGB 12.4*  HCT 39.0  PLT 271    Chemistries  Recent Labs  Lab 02/23/18 0730  NA 133*  K 5.5*  CL 105  CO2 20*  GLUCOSE 295*  BUN 21*  CREATININE 1.71*  CALCIUM 8.0*   Cardiac Enzymes Recent Labs  Lab 02/22/18 2201  TROPONINI <0.03   RADIOLOGY:  Dg Chest 2 View  Result Date: 02/22/2018 CLINICAL DATA:  58 year old male with increasing shortness of breath, cough. EXAM: CHEST - 2 VIEW COMPARISON:  Chest radiographs 09/30/2012. FINDINGS: Abnormal opacity in the left lung appears to be multifocal, both about the left hilum (rounded contour) and in a peribronchial pattern of the left lower lobe. Lower lung volumes. More subtle increased opacity about the right hilum appears more indeterminate, might be atelectasis. No definite pleural effusion. No pneumothorax. No pulmonary edema suspected. Mediastinal contours remain normal. Visualized tracheal air column is within normal limits. No acute osseous abnormality identified. Negative visible bowel gas pattern. IMPRESSION: 1. Multifocal opacity in the left lung, configuration favoring multilobar pneumonia. Questionable involvement also in the right lung. No pleural effusion identified. 2. Followup PA and lateral chest X-ray is recommended in 3-4 weeks following trial of antibiotic therapy to ensure resolution and exclude underlying malignancy. Electronically Signed   By: Genevie Ann M.D.   On: 02/22/2018  22:41   ASSESSMENT AND PLAN:   Daniel Moon  is a 58 y.o. male who presents with chief complaint as above.  Patient presents to the ED with a complaint of  shortness of breath, cough, nausea.  He states is been going on for about a week.  Patient was found here in the ED to have multifocal pneumonia.  He initially was stable from a respiratory status, but then began to drop his O2 sats.  He required BiPAP  *Acute respiratory failure with hypoxia ([place]) -patient placed on BiPAP with improvement in his O2 sats.  We will wean this as tolerated.  This is due to his multifocal pneumonia as below -sats are more than 92% on 2 L nasal cannula.  *  Multifocal pneumonia -IV antibiotics initiated.  Patient did not meet sepsis criteria.   Supportive treatment with BiPAP as above, also duo nebs and antitussive PRN    *COPD with acute exacerbation ([place]) -treatment as above -received one dose of Solu-Medrol. Currently not wheezing. Hold off further steroids.  * HTN (hypertension) -continue home dose antihypertensives   * Diabetes (HCC) -sliding scale insulin coverage lantus 20 units daily   * HLD (hyperlipidemia) -continue home dose antilipid  Patient hemodynamically stable. He is being transferred out to medical floor  CODE STATUS: FULL  DVT Prophylaxis: lovenox  TOTAL TIME TAKING CARE OF THIS PATIENT: *30* minutes.  >50% time spent on counselling and coordination of care  POSSIBLE D/C IN *1-2* DAYS, DEPENDING ON CLINICAL CONDITION.  Note: This dictation was prepared with Dragon dictation along with smaller phrase technology. Any transcriptional errors that result from this process are unintentional.  Fritzi Mandes M.D on 02/23/2018 at 2:40 PM  Between 7am to 6pm - Pager - [phone]  After 6pm go to [email or web address] - password [email or web address] [place] [facility]  Office  [phone]  CC: Primary care physician; System, Pcp Not InPatient ID: Daniel Moon, male   DOB: 07/14/1960, 58 y.o.   MRN: [id number]

## 2018-02-23 NOTE — ED Notes (Signed)
Report off to kala rn 

## 2018-02-23 NOTE — ED Notes (Signed)
ED TO INPATIENT HANDOFF REPORT  Name/Age/Gender Daniel Moon 58 y.o. male  Code Status   Home/SNF/Other home  Chief Complaint SOB  Level of Care/Admitting Diagnosis ED Disposition    ED Disposition Condition [facility] Area: [place] [[id number]]  Level of Care: Stepdown [14]  Diagnosis: Acute respiratory failure with hypoxia [facility]) [[id number]]  Admitting Physician: Lance Coon [[id number]]  Attending Physician: Lance Coon [phone]  Estimated length of stay: past midnight tomorrow  Certification:: I certify this patient will need inpatient services for at least 2 midnights  PT Class (Do Not Modify): Inpatient [101]  PT Acc Code (Do Not Modify): Private [1]       Medical History Past Medical History:  Diagnosis Date  . Diabetes mellitus without complication ([place])   . Heart attack ([place])   . HLD (hyperlipidemia)   . HTN (hypertension)   . Systolic CHF ([place])     Allergies No Known Allergies  IV Location/Drains/Wounds Patient Lines/Drains/Airways Status   Active Line/Drains/Airways    Name:   Placement date:   Placement time:   Site:   Days:   Peripheral IV 02/22/18 Right Antecubital   02/22/18    2330    Antecubital   1          Labs/Imaging Results for orders placed or performed during the hospital encounter of 02/22/18 (from the past 48 hour(s))  Basic metabolic panel     Status: Abnormal   Collection Time: 02/22/18 10:01 PM  Result Value Ref Range   Sodium 135 135 - 145 mmol/L   Potassium 4.9 3.5 - 5.1 mmol/L   Chloride 107 98 - 111 mmol/L   CO2 20 (L) 22 - 32 mmol/L   Glucose, Bld 169 (H) 70 - 99 mg/dL   BUN 18 6 - 20 mg/dL   Creatinine, Ser 1.55 (H) 0.61 - 1.24 mg/dL   Calcium 8.4 (L) 8.9 - 10.3 mg/dL   GFR calc non Af Amer 49 (L) >60 mL/min   GFR calc Af Amer 57 (L) >60 mL/min   Anion gap 8 5 - 15    Comment: Performed at [facility], [address] [place], [place] [address]  CBC     Status:  Abnormal   Collection Time: 02/22/18 10:01 PM  Result Value Ref Range   WBC 8.9 4.0 - 10.5 K/uL   RBC 4.82 4.22 - 5.81 MIL/uL   Hemoglobin 12.4 (L) 13.0 - 17.0 g/dL   HCT 38.5 (L) 39.0 - 52.0 %   MCV 79.9 (L) 80.0 - 100.0 fL   MCH 25.7 (L) 26.0 - 34.0 pg   MCHC 32.2 30.0 - 36.0 g/dL   RDW 14.4 11.5 - 15.5 %   Platelets 267 150 - 400 K/uL   nRBC 0.0 0.0 - 0.2 %    Comment: Performed at [facility], [address] [place], [place] [address]  Troponin I - ONCE - STAT     Status: None   Collection Time: 02/22/18 10:01 PM  Result Value Ref Range   Troponin I <0.03 <0.03 ng/mL    Comment: Performed at [facility], [address] [place], [place] [address]  Brain natriuretic peptide     Status: Abnormal   Collection Time: 02/22/18 10:01 PM  Result Value Ref Range   B Natriuretic Peptide 592.0 (H) 0.0 - 100.0 pg/mL    Comment: Performed at [facility], [address], [place], [place] [address]  Influenza  panel by PCR (type A & B)     Status: None   Collection Time: 02/22/18 10:41 PM  Result Value Ref Range   Influenza A By PCR NEGATIVE NEGATIVE   Influenza B By PCR NEGATIVE NEGATIVE    Comment: (NOTE) The Xpert Xpress Flu assay is intended as an aid in the diagnosis of  influenza and should not be used as a sole basis for treatment.  This  assay is FDA approved for nasopharyngeal swab specimens only. Nasal  washings and aspirates are unacceptable for Xpert Xpress Flu testing. Performed at [facility], [address] [place], [place] [address]   Lactic acid, plasma     Status: None   Collection Time: 02/22/18 10:41 PM  Result Value Ref Range   Lactic Acid, Venous 1.3 0.5 - 1.9 mmol/L    Comment: Performed at [facility], [address] [place], [place] [address]  Blood gas, venous     Status: Abnormal   Collection Time: 02/22/18 10:46 PM  Result Value Ref Range   pH, Ven 7.39 7.250 - 7.430   pCO2, Ven 37 (L) 44.0 - 60.0  mmHg   pO2, Ven 40.0 32.0 - 45.0 mmHg   Bicarbonate 22.4 20.0 - 28.0 mmol/L   Acid-base deficit 2.2 (H) 0.0 - 2.0 mmol/L   O2 Saturation 74.3 %   Patient temperature 37.0    Sample type VENOUS     Comment: Performed at [facility], [address] [place], [place] [address]  Blood gas, arterial     Status: Abnormal   Collection Time: 02/23/18 12:09 AM  Result Value Ref Range   FIO2 0.40    Delivery systems BILEVEL POSITIVE AIRWAY PRESSURE    Inspiratory PAP 12    Expiratory PAP 6    pH, Arterial 7.35 7.350 - 7.450   pCO2 arterial 41 32.0 - 48.0 mmHg   pO2, Arterial 62 (L) 83.0 - 108.0 mmHg   Bicarbonate 22.6 20.0 - 28.0 mmol/L   Acid-base deficit 2.9 (H) 0.0 - 2.0 mmol/L   O2 Saturation 90.1 %   Patient temperature 37.0    Collection site RIGHT RADIAL    Sample type ARTERIAL DRAW    Allens test (pass/fail) PASS PASS    Comment: Performed at [facility], [address] [place], [place] [address]   Dg Chest 2 View  Result Date: 02/22/2018 CLINICAL DATA:  58 year old male with increasing shortness of breath, cough. EXAM: CHEST - 2 VIEW COMPARISON:  Chest radiographs 09/30/2012. FINDINGS: Abnormal opacity in the left lung appears to be multifocal, both about the left hilum (rounded contour) and in a peribronchial pattern of the left lower lobe. Lower lung volumes. More subtle increased opacity about the right hilum appears more indeterminate, might be atelectasis. No definite pleural effusion. No pneumothorax. No pulmonary edema suspected. Mediastinal contours remain normal. Visualized tracheal air column is within normal limits. No acute osseous abnormality identified. Negative visible bowel gas pattern. IMPRESSION: 1. Multifocal opacity in the left lung, configuration favoring multilobar pneumonia. Questionable involvement also in the right lung. No pleural effusion identified. 2. Followup PA and lateral chest X-ray is recommended in 3-4 weeks following trial of  antibiotic therapy to ensure resolution and exclude underlying malignancy. Electronically Signed   By: Genevie Ann M.D.   On: 02/22/2018 22:41    Pending Labs Unresulted Labs (From admission, onward)    Start     Ordered   02/22/18 2246  Blood culture (routine x  2)  BLOOD CULTURE X 2,   STAT     02/22/18 2247   02/22/18 2246  Lactic acid, plasma  Now then every 2 hours,   STAT     02/22/18 2247   Signed and Held  HIV antibody (Routine Testing)  Once,   R     Signed and Held   Signed and Held  CBC  (enoxaparin (LOVENOX)    CrCl >/= 30 ml/min)  Once,   R    Comments:  Baseline for enoxaparin therapy IF NOT ALREADY DRAWN.  Notify MD if PLT < 100 K.    Signed and Held   Signed and Held  Creatinine, serum  (enoxaparin (LOVENOX)    CrCl >/= 30 ml/min)  Once,   R    Comments:  Baseline for enoxaparin therapy IF NOT ALREADY DRAWN.    Signed and Held   Signed and Held  Creatinine, serum  (enoxaparin (LOVENOX)    CrCl >/= 30 ml/min)  Weekly,   R    Comments:  while on enoxaparin therapy    Signed and Held   Signed and Held  Basic metabolic panel  Tomorrow morning,   R     Signed and Held   Signed and Held  CBC  Tomorrow morning,   R     Signed and Held          Vitals/Pain Today's Vitals   02/23/18 0015 02/23/18 0100 02/23/18 0200 02/23/18 0300  BP:  132/77 134/89 115/72  Pulse: 84 87  81  Resp: 18 17 17    Temp:      TempSrc:      SpO2: 94% 90%  100%  Weight:      Height:      PainSc:        Isolation Precautions Droplet precaution  Medications Medications  sodium chloride 0.9 % bolus 1,000 mL (0 mLs Intravenous Hold 02/23/18 0304)  azithromycin (ZITHROMAX) 500 mg in sodium chloride 0.9 % 250 mL IVPB (500 mg Intravenous New Bag/Given 02/23/18 0234)  ipratropium-albuterol (DUONEB) 0.5-2.5 (3) MG/3ML nebulizer solution 3 mL (3 mLs Nebulization Given 02/22/18 2240)  methylPREDNISolone sodium succinate (SOLU-MEDROL) 125 mg/2 mL injection 125 mg (125 mg Intravenous Given 02/22/18 2240)   sodium chloride 0.9 % bolus 1,000 mL (0 mLs Intravenous Stopped 02/23/18 0048)  cefTRIAXone (ROCEPHIN) 1 g in sodium chloride 0.9 % 100 mL IVPB (0 g Intravenous Stopped 02/23/18 0112)  ketorolac (TORADOL) 30 MG/ML injection 30 mg (30 mg Intravenous Given 02/22/18 2334)  ondansetron (ZOFRAN) injection 4 mg (4 mg Intravenous Given 02/22/18 2334)    Mobility Walks

## 2018-02-23 NOTE — H&P (Signed)
[place] at [place] NAME: Daniel Moon    MR#:  [id number]  DATE OF BIRTH:  1960-09-30  DATE OF ADMISSION:  02/22/2018  PRIMARY CARE PHYSICIAN: System, Pcp Not In   REQUESTING/REFERRING PHYSICIAN: Mariea Clonts, MD  CHIEF COMPLAINT:   Chief Complaint  Patient presents with  . Emesis  . Nausea  . Cough  . Shortness of Breath    HISTORY OF PRESENT ILLNESS:  Daniel Moon  is a 58 y.o. male who presents with chief complaint as above.  Patient presents to the ED with a complaint of shortness of breath, cough, nausea.  He states is been going on for about a week.  Patient was found here in the ED to have multifocal pneumonia.  He initially was stable from a respiratory status, but then began to drop his O2 sats.  He required BiPAP.  Hospitalist were called for admission  PAST MEDICAL HISTORY:   Past Medical History:  Diagnosis Date  . Diabetes mellitus without complication ([place])   . Heart attack ([place])   . HLD (hyperlipidemia)   . HTN (hypertension)   . Systolic CHF ([place])      PAST SURGICAL HISTORY:   Past Surgical History:  Procedure Laterality Date  . ABSCESS DRAINAGE       SOCIAL HISTORY:   Social History   Tobacco Use  . Smoking status: Heavy Tobacco Smoker  . Smokeless tobacco: Never Used  Substance Use Topics  . Alcohol use: Yes     FAMILY HISTORY:   Family History  Problem Relation Age of Onset  . Cancer Father   . Hypertension Father   . Heart disease Father   . Diabetes Father   . Stroke Sister   . Stroke Brother      DRUG ALLERGIES:  No Known Allergies  MEDICATIONS AT HOME:   Prior to Admission medications   Medication Sig Start Date End Date Taking? Authorizing Provider  amLODipine (NORVASC) 10 MG tablet Take 10 mg by mouth daily. 10/24/17  Yes [provider]  atorvastatin (LIPITOR) 40 MG tablet Take 40 mg by mouth at bedtime. 10/24/17  Yes [provider]  carvedilol  (COREG) 12.5 MG tablet Take 12.5 mg by mouth 2 (two) times daily. 10/24/17  Yes [provider]  clopidogrel (PLAVIX) 75 MG tablet Take 75 mg by mouth daily. 10/24/17  Yes [provider]  gabapentin (NEURONTIN) 300 MG capsule Take 300 mg by mouth at bedtime. 02/14/18  Yes [provider]  HUMULIN N 100 UNIT/ML injection Inject 10 Units into the skin 2 (two) times daily. 10/24/17  Yes [provider]  HUMULIN R 100 UNIT/ML injection Inject 2 Units into the skin 3 (three) times daily. 10/24/17  Yes [provider]  losartan (COZAAR) 25 MG tablet Take 25 mg by mouth daily. 10/24/17  Yes [provider]  mupirocin ointment (BACTROBAN) 2 % Apply 1 application topically daily. 02/15/18  Yes [provider]  sulfamethoxazole-trimethoprim (BACTRIM DS,SEPTRA DS) 800-160 MG tablet Take 1 tablet by mouth 2 (two) times daily. 02/15/18 03/01/18 Yes [provider]    REVIEW OF SYSTEMS:  Review of Systems  Constitutional: Negative for chills, fever, malaise/fatigue and weight loss.  HENT: Negative for ear pain, hearing loss and tinnitus.   Eyes: Negative for blurred vision, double vision, pain and redness.  Respiratory: Positive for cough and shortness of breath. Negative for hemoptysis.   Cardiovascular: Negative for chest pain, palpitations, orthopnea and  leg swelling.  Gastrointestinal: Positive for nausea. Negative for abdominal pain, constipation, diarrhea and vomiting.  Genitourinary: Negative for dysuria, frequency and hematuria.  Musculoskeletal: Negative for back pain, joint pain and neck pain.  Skin:       No acne, rash, or lesions  Neurological: Negative for dizziness, tremors, focal weakness and weakness.  Endo/Heme/Allergies: Negative for polydipsia. Does not bruise/bleed easily.  Psychiatric/Behavioral: Negative for depression. The patient is not nervous/anxious and does not have insomnia.      VITAL SIGNS:   Vitals:    02/22/18 2200 02/22/18 2345 02/23/18 0000 02/23/18 0015  BP: (!) 129/92 128/78 135/80   Pulse: 84 87 86 84  Resp: (!) 25 20 (!) 40 18  Temp:      TempSrc:      SpO2: 95% (!) 79% (!) 81% 94%  Weight:      Height:       Wt Readings from Last 3 Encounters:  02/22/18 131.5 kg  08/31/17 88.9 kg    PHYSICAL EXAMINATION:  Physical Exam  Vitals reviewed. Constitutional: He is oriented to person, place, and time. He appears well-developed and well-nourished. No distress.  HENT:  Head: Normocephalic and atraumatic.  Mouth/Throat: Oropharynx is clear and moist.  Eyes: Pupils are equal, round, and reactive to light. Conjunctivae and EOM are normal. No scleral icterus.  Neck: Normal range of motion. Neck supple. No JVD present. No thyromegaly present.  Cardiovascular: Normal rate, regular rhythm and intact distal pulses. Exam reveals no gallop and no friction rub.  No murmur heard. Respiratory: He is in respiratory distress. He has no wheezes. He has no rales.  Patchy bilateral coarse breath sounds  GI: Soft. Bowel sounds are normal. He exhibits no distension. There is no abdominal tenderness.  Musculoskeletal: Normal range of motion.        General: No edema.     Comments: No arthritis, no gout  Lymphadenopathy:    He has no cervical adenopathy.  Neurological: He is alert and oriented to person, place, and time. No cranial nerve deficit.  No dysarthria, no aphasia  Skin: Skin is warm and dry. No rash noted. No erythema.  Psychiatric: He has a normal mood and affect. His behavior is normal. Judgment and thought content normal.    LABORATORY PANEL:   CBC Recent Labs  Lab 02/22/18 2201  WBC 8.9  HGB 12.4*  HCT 38.5*  PLT 267   ------------------------------------------------------------------------------------------------------------------  Chemistries  Recent Labs  Lab 02/22/18 2201  NA 135  K 4.9  CL 107  CO2 20*  GLUCOSE 169*  BUN 18  CREATININE 1.55*  CALCIUM 8.4*    ------------------------------------------------------------------------------------------------------------------  Cardiac Enzymes Recent Labs  Lab 02/22/18 2201  TROPONINI <0.03   ------------------------------------------------------------------------------------------------------------------  RADIOLOGY:  Dg Chest 2 View  Result Date: 02/22/2018 CLINICAL DATA:  58 year old male with increasing shortness of breath, cough. EXAM: CHEST - 2 VIEW COMPARISON:  Chest radiographs 09/30/2012. FINDINGS: Abnormal opacity in the left lung appears to be multifocal, both about the left hilum (rounded contour) and in a peribronchial pattern of the left lower lobe. Lower lung volumes. More subtle increased opacity about the right hilum appears more indeterminate, might be atelectasis. No definite pleural effusion. No pneumothorax. No pulmonary edema suspected. Mediastinal contours remain normal. Visualized tracheal air column is within normal limits. No acute osseous abnormality identified. Negative visible bowel gas pattern. IMPRESSION: 1. Multifocal opacity in the left lung, configuration favoring multilobar pneumonia. Questionable involvement also in the right lung. No pleural effusion  identified. 2. Followup PA and lateral chest X-ray is recommended in 3-4 weeks following trial of antibiotic therapy to ensure resolution and exclude underlying malignancy. Electronically Signed   By: Genevie Ann M.D.   On: 02/22/2018 22:41    EKG:   Orders placed or performed during the hospital encounter of 02/22/18  . EKG 12-Lead  . EKG 12-Lead    IMPRESSION AND PLAN:  Principal Problem:   Acute respiratory failure with hypoxia (HCC) -patient placed on BiPAP with improvement in his O2 sats.  We will wean this as tolerated.  This is due to his multifocal pneumonia as below Active Problems:   Multifocal pneumonia -IV antibiotics initiated.  Patient did not meet sepsis criteria.  Supportive treatment with BiPAP as  above, also duo nebs and antitussive PRN   COPD with acute exacerbation ([place]) -treatment as above also to include IV Solu-Medrol   HTN (hypertension) -continue home dose antihypertensives   Diabetes (HCC) -sliding scale insulin coverage   HLD (hyperlipidemia) -continue home dose antilipid  Chart review performed and case discussed with ED provider. Labs, imaging and/or ECG reviewed by provider and discussed with patient/family. Management plans discussed with the patient and/or family.  DVT PROPHYLAXIS: SubQ lovenox   GI PROPHYLAXIS:  None  ADMISSION STATUS: Inpatient     CODE STATUS: Full  TOTAL CRITICAL CARE TIME TAKING CARE OF THIS PATIENT: 50 minutes.   Ethlyn Daniels 02/23/2018, 1:07 AM  [facility]  Office  [phone]  CC: Primary care physician; System, Pcp Not In  Note:  This document was prepared using Dragon voice recognition software and may include unintentional dictation errors.

## 2018-02-23 NOTE — ED Notes (Signed)
Called to give report. Floor is in the middle of a code blue and will call back

## 2018-02-23 NOTE — ED Notes (Signed)
Pt waiting on admission bed.   bipap in place.  Pt resting with eyes closed   nsr on monitor.  Iv in place.

## 2018-02-23 NOTE — ED Notes (Signed)
Called second time to give report to floor. Floor states they are not ready to receive pt and will call this RN back.

## 2018-02-23 NOTE — Progress Notes (Signed)
Inpatient Diabetes Program Recommendations  AACE/ADA: New Consensus Statement on Inpatient Glycemic Control  Target Ranges:  Prepandial:   less than 140 mg/dL      Peak postprandial:   less than 180 mg/dL (1-2 hours)      Critically ill patients:  140 - 180 mg/dL   Results for ROBBIE, RIDEAUX (MRN [id number]) as of 02/23/2018 10:29  Ref. Range 02/23/2018 08:43  Glucose-Capillary Latest Ref Range: 70 - 99 mg/dL 268 (H)  Results for JAFET, WISSING (MRN [id number]) as of 02/23/2018 10:29  Ref. Range 02/22/2018 22:01 02/23/2018 07:30  Glucose Latest Ref Range: 70 - 99 mg/dL 169 (H) 295 (H)   Review of Glycemic Control  Diabetes history: DM2 Outpatient Diabetes medications: NPH 10 units BID, Regular 2 units TID with meals Current orders for Inpatient glycemic control: Novolog 0-15 units AC&HS; Solumedrol 60 mg Q6H  Inpatient Diabetes Program Recommendations:  Insulin - Basal: If steroids are continued, please consider ordering Lantus 10 units Q24H.  Insulin-Meal Coverage: If steroids are continued and post prandial glucose is consistently greater than 180 mg/dl, please consider ordering Novolog 3 units TID with meals for meal coverage if patient eats at least 50% of meals.  HbgA1C: Please consider ordering an A1C to evaluate glycemic control over the past 2-3 months.  Thanks, Barnie Alderman, RN, MSN, CDE Diabetes Coordinator Inpatient Diabetes Program [phone] (Team Pager from 8am to 5pm)

## 2018-02-24 LAB — GLUCOSE, CAPILLARY: Glucose-Capillary: 130 mg/dL — ABNORMAL HIGH (ref 70–99)

## 2018-02-24 MED ORDER — CEFDINIR 300 MG PO CAPS: 300.0000 mg | ORAL_CAPSULE | Freq: Two times a day (BID) | ORAL | 0 refills | Status: AC

## 2018-02-24 MED ORDER — PREDNISONE 20 MG PO TABS: 40.0000 mg | ORAL_TABLET | Freq: Every day | ORAL | 0 refills | Status: AC

## 2018-02-24 MED ORDER — MOMETASONE FURO-FORMOTEROL FUM 200-5 MCG/ACT IN AERO: 2.0000 | INHALATION_SPRAY | Freq: Two times a day (BID) | RESPIRATORY_TRACT | 0 refills | Status: DC

## 2018-02-24 MED ORDER — NICOTINE 21 MG/24HR TD PT24: 21.0000 mg | MEDICATED_PATCH | TRANSDERMAL | 1 refills | Status: AC

## 2018-02-24 MED ORDER — GABAPENTIN 300 MG PO CAPS
300.0000 mg | ORAL_CAPSULE | Freq: Every day | ORAL | Status: DC
  Administered 2018-02-24: 300 mg via ORAL
  Filled 2018-02-24: qty 1

## 2018-02-24 MED ORDER — OXYCODONE-ACETAMINOPHEN 5-325 MG PO TABS
1.0000 | ORAL_TABLET | Freq: Four times a day (QID) | ORAL | Status: DC | PRN
  Administered 2018-02-24: 1 via ORAL
  Filled 2018-02-24: qty 1

## 2018-02-24 MED ORDER — AZITHROMYCIN 250 MG PO TABS: 250.0000 mg | ORAL_TABLET | Freq: Every day | ORAL | 0 refills | Status: AC

## 2018-02-24 NOTE — Discharge Summary (Signed)
[facility] [place] NAME: Daniel Moon    MR#:  [id number]  DATE OF BIRTH:  Jun 05, 1960  DATE OF ADMISSION:  02/22/2018 ADMITTING PHYSICIAN: Lance Coon, MD  DATE OF DISCHARGE: 02/24/2018 10:30 AM  PRIMARY CARE PHYSICIAN: Sanjuana Letters, MD    ADMISSION DIAGNOSIS:  Hypoxia [R09.02] Elevated brain natriuretic peptide (BNP) level [R79.89] Multifocal pneumonia [J18.9]  DISCHARGE DIAGNOSIS:  Principal Problem:   Acute respiratory failure with hypoxia (HCC) Active Problems:   Multifocal pneumonia   HTN (hypertension)   Diabetes ([place])   HLD (hyperlipidemia)   COPD with acute exacerbation ([place])   SECONDARY DIAGNOSIS:   Past Medical History:  Diagnosis Date  . Diabetes mellitus without complication ([place])   . Heart attack ([place])   . HLD (hyperlipidemia)   . HTN (hypertension)   . Systolic CHF [facility])     HOSPITAL COURSE:   58 year old male with history of diabetes, COPD and hypertension who presents to the emergency room due to shortness of breath.  1.  Acute hypoxic respiratory failure due to community-acquired pneumonia Patient was initially on BiPAP then transition to nasal cannula and now on room air.  2.  Community-acquired pneumonia (multifocal on chest x-ray): Patient was on IV antibiotics.  Patient will be discharged on oral azithromycin and cefdinir  3.  Diabetes: Continue outpatient regimen with ADA diet  4.  COPD with mild exacerbation: Patient will be discharged on prednisone for 3 more days.  5.  Hyperlipidemia: Continue statin.  6.  Essential hypertension: He will continue Norvasc, Coreg, losartan  7. Tobacco dependence: Patient is encouraged to quit smoking. Counseling was provided for 4 minutes.   DISCHARGE CONDITIONS AND DIET:   Stable Diabetic diet  CONSULTS OBTAINED:    DRUG ALLERGIES:  No Known Allergies  DISCHARGE MEDICATIONS:   Allergies as of 02/24/2018   No Known Allergies     Medication  List    STOP taking these medications   sulfamethoxazole-trimethoprim 800-160 MG tablet Commonly known as:  BACTRIM DS,SEPTRA DS     TAKE these medications   amLODipine 10 MG tablet Commonly known as:  NORVASC Take 10 mg by mouth daily.   atorvastatin 40 MG tablet Commonly known as:  LIPITOR Take 40 mg by mouth at bedtime.   azithromycin 250 MG tablet Commonly known as:  ZITHROMAX Take 1 tablet (250 mg total) by mouth daily for 3 days. Start taking on:  February 25, 2018   carvedilol 12.5 MG tablet Commonly known as:  COREG Take 12.5 mg by mouth 2 (two) times daily.   cefdinir 300 MG capsule Commonly known as:  OMNICEF Take 1 capsule (300 mg total) by mouth 2 (two) times daily for 4 days.   clopidogrel 75 MG tablet Commonly known as:  PLAVIX Take 75 mg by mouth daily.   gabapentin 300 MG capsule Commonly known as:  NEURONTIN Take 300 mg by mouth at bedtime.   HUMULIN N 100 UNIT/ML injection Generic drug:  insulin NPH Human Inject 10 Units into the skin 2 (two) times daily.   HUMULIN R 100 units/mL injection Generic drug:  insulin regular Inject 2 Units into the skin 3 (three) times daily.   losartan 25 MG tablet Commonly known as:  COZAAR Take 25 mg by mouth daily.   mometasone-formoterol 200-5 MCG/ACT Aero Commonly known as:  DULERA Inhale 2 puffs into the lungs 2 (two) times daily for 29 days.   mupirocin ointment 2 % Commonly known as:  BACTROBAN Apply 1 application topically daily.   nicotine 21 mg/24hr patch Commonly known as:  NICODERM CQ - dosed in mg/24 hours Place 1 patch (21 mg total) onto the skin daily.   predniSONE 20 MG tablet Commonly known as:  DELTASONE Take 2 tablets (40 mg total) by mouth daily with breakfast for 3 days. Start taking on:  February 25, 2018         Today   CHIEF COMPLAINT:  Doing well no sob or chest pain Wants to go home   VITAL SIGNS:  Blood pressure 134/83, pulse 89, temperature 98.2 F (36.8 C),  temperature source Oral, resp. rate (!) 21, height 6\' 3"  (1.905 m), weight 131.5 kg, SpO2 94 %.   REVIEW OF SYSTEMS:  Review of Systems  Constitutional: Negative.  Negative for chills, fever and malaise/fatigue.  HENT: Negative.  Negative for ear discharge, ear pain, hearing loss, nosebleeds and sore throat.   Eyes: Negative.  Negative for blurred vision and pain.  Respiratory: Negative.  Negative for cough, hemoptysis, shortness of breath and wheezing.   Cardiovascular: Negative.  Negative for chest pain, palpitations and leg swelling.  Gastrointestinal: Negative.  Negative for abdominal pain, blood in stool, diarrhea, nausea and vomiting.  Genitourinary: Negative.  Negative for dysuria.  Musculoskeletal: Negative.  Negative for back pain.  Skin: Negative.   Neurological: Negative for dizziness, tremors, speech change, focal weakness, seizures and headaches.  Endo/Heme/Allergies: Negative.  Does not bruise/bleed easily.  Psychiatric/Behavioral: Negative.  Negative for depression, hallucinations and suicidal ideas.     PHYSICAL EXAMINATION:  GENERAL:  58 y.o.-year-old patient lying in the bed with no acute distress.  NECK:  Supple, no jugular venous distention. No thyroid enlargement, no tenderness.  LUNGS: Normal breath sounds bilaterally, no wheezing, rales,rhonchi  No use of accessory muscles of respiration.  CARDIOVASCULAR: S1, S2 normal. No murmurs, rubs, or gallops.  ABDOMEN: Soft, non-tender, non-distended. Bowel sounds present. No organomegaly or mass.  EXTREMITIES: No pedal edema, cyanosis, or clubbing.  PSYCHIATRIC: The patient is alert and oriented x 3.  SKIN: No obvious rash, lesion, or ulcer.   DATA REVIEW:   CBC Recent Labs  Lab 02/23/18 0730  WBC 8.4  HGB 12.4*  HCT 39.0  PLT 271    Chemistries  Recent Labs  Lab 02/23/18 0730 02/23/18 1943  NA 133*  --   K 5.5* 4.7  CL 105  --   CO2 20*  --   GLUCOSE 295*  --   BUN 21*  --   CREATININE 1.71*  --    CALCIUM 8.0*  --     Cardiac Enzymes Recent Labs  Lab 02/22/18 2201  TROPONINI <0.03    Microbiology Results  @[email or web address] @  RADIOLOGY:  Dg Chest 2 View  Result Date: 02/22/2018 CLINICAL DATA:  58 year old male with increasing shortness of breath, cough. EXAM: CHEST - 2 VIEW COMPARISON:  Chest radiographs 09/30/2012. FINDINGS: Abnormal opacity in the left lung appears to be multifocal, both about the left hilum (rounded contour) and in a peribronchial pattern of the left lower lobe. Lower lung volumes. More subtle increased opacity about the right hilum appears more indeterminate, might be atelectasis. No definite pleural effusion. No pneumothorax. No pulmonary edema suspected. Mediastinal contours remain normal. Visualized tracheal air column is within normal limits. No acute osseous abnormality identified. Negative visible bowel gas pattern. IMPRESSION: 1. Multifocal opacity in the left lung, configuration favoring multilobar pneumonia. Questionable involvement also in the right lung. No pleural effusion identified. 2. Followup  PA and lateral chest X-ray is recommended in 3-4 weeks following trial of antibiotic therapy to ensure resolution and exclude underlying malignancy. Electronically Signed   By: Genevie Ann M.D.   On: 02/22/2018 22:41      Allergies as of 02/24/2018   No Known Allergies     Medication List    STOP taking these medications   sulfamethoxazole-trimethoprim 800-160 MG tablet Commonly known as:  BACTRIM DS,SEPTRA DS     TAKE these medications   amLODipine 10 MG tablet Commonly known as:  NORVASC Take 10 mg by mouth daily.   atorvastatin 40 MG tablet Commonly known as:  LIPITOR Take 40 mg by mouth at bedtime.   azithromycin 250 MG tablet Commonly known as:  ZITHROMAX Take 1 tablet (250 mg total) by mouth daily for 3 days. Start taking on:  February 25, 2018   carvedilol 12.5 MG tablet Commonly known as:  COREG Take 12.5 mg by mouth 2 (two) times daily.    cefdinir 300 MG capsule Commonly known as:  OMNICEF Take 1 capsule (300 mg total) by mouth 2 (two) times daily for 4 days.   clopidogrel 75 MG tablet Commonly known as:  PLAVIX Take 75 mg by mouth daily.   gabapentin 300 MG capsule Commonly known as:  NEURONTIN Take 300 mg by mouth at bedtime.   HUMULIN N 100 UNIT/ML injection Generic drug:  insulin NPH Human Inject 10 Units into the skin 2 (two) times daily.   HUMULIN R 100 units/mL injection Generic drug:  insulin regular Inject 2 Units into the skin 3 (three) times daily.   losartan 25 MG tablet Commonly known as:  COZAAR Take 25 mg by mouth daily.   mometasone-formoterol 200-5 MCG/ACT Aero Commonly known as:  DULERA Inhale 2 puffs into the lungs 2 (two) times daily for 29 days.   mupirocin ointment 2 % Commonly known as:  BACTROBAN Apply 1 application topically daily.   nicotine 21 mg/24hr patch Commonly known as:  NICODERM CQ - dosed in mg/24 hours Place 1 patch (21 mg total) onto the skin daily.   predniSONE 20 MG tablet Commonly known as:  DELTASONE Take 2 tablets (40 mg total) by mouth daily with breakfast for 3 days. Start taking on:  February 25, 2018          Management plans discussed with the patient and he is in agreement. Stable for discharge home  Patient should follow up with pcp  CODE STATUS:     Code Status Orders  (From admission, onward)         Start     Ordered   02/23/18 0608  Full code  Continuous     02/23/18 0607        Code Status History    This patient has a current code status but no historical code status.      TOTAL TIME TAKING CARE OF THIS PATIENT: 38 minutes.    Note: This dictation was prepared with Dragon dictation along with smaller phrase technology. Any transcriptional errors that result from this process are unintentional.  Pearlena Ow M.D on 02/24/2018 at 12:28 PM  Between 7am to 6pm - Pager - [phone] After 6pm go to [email or web address] - password  [facility]  Office  [phone]  CC: Primary care physician; Sanjuana Letters, MD

## 2018-02-24 NOTE — Progress Notes (Signed)
Pt being discharged home, discharge and prescriptions reviewed with pt and daughter, states understanding, pt with no complaints at discharge

## 2018-02-24 NOTE — Plan of Care (Signed)

## 2018-02-24 NOTE — Progress Notes (Signed)
SATURATION QUALIFICATIONS: (This note is used to comply with regulatory documentation for home oxygen)  Patient Saturations on Room Air at Rest = 93%  Patient Saturations on Room Air while Ambulating = 90%   

## 2018-02-26 LAB — HIV ANTIBODY (ROUTINE TESTING W REFLEX): HIV Screen 4th Generation wRfx: NONREACTIVE

## 2018-02-27 LAB — CULTURE, BLOOD (ROUTINE X 2)
CULTURE: NO GROWTH
CULTURE: NO GROWTH
SPECIAL REQUESTS: ADEQUATE
Special Requests: ADEQUATE

## 2018-09-30 ENCOUNTER — Encounter (HOSPITAL_COMMUNITY): Payer: Self-pay | Admitting: Student

## 2018-09-30 ENCOUNTER — Other Ambulatory Visit: Payer: Self-pay

## 2018-09-30 ENCOUNTER — Emergency Department (HOSPITAL_COMMUNITY): Payer: Medicaid Other

## 2018-09-30 ENCOUNTER — Inpatient Hospital Stay (HOSPITAL_COMMUNITY)
Admission: EM | Admit: 2018-09-30 | Discharge: 2018-10-14 | DRG: 854 | Disposition: A | Discharge status: Home Health | Payer: Medicaid Other | Attending: Internal Medicine | Admitting: Internal Medicine

## 2018-09-30 DIAGNOSIS — Z20828 Contact with and (suspected) exposure to other viral communicable diseases: Secondary | ICD-10-CM | POA: Diagnosis present

## 2018-09-30 DIAGNOSIS — Z6822 Body mass index (BMI) 22.0-22.9, adult: Secondary | ICD-10-CM

## 2018-09-30 DIAGNOSIS — I252 Old myocardial infarction: Secondary | ICD-10-CM | POA: Diagnosis not present

## 2018-09-30 DIAGNOSIS — N179 Acute kidney failure, unspecified: Secondary | ICD-10-CM | POA: Diagnosis present

## 2018-09-30 DIAGNOSIS — M8660 Other chronic osteomyelitis, unspecified site: Secondary | ICD-10-CM | POA: Diagnosis not present

## 2018-09-30 DIAGNOSIS — L8990 Pressure ulcer of unspecified site, unspecified stage: Secondary | ICD-10-CM | POA: Diagnosis present

## 2018-09-30 DIAGNOSIS — S91302A Unspecified open wound, left foot, initial encounter: Secondary | ICD-10-CM | POA: Diagnosis present

## 2018-09-30 DIAGNOSIS — I739 Peripheral vascular disease, unspecified: Secondary | ICD-10-CM

## 2018-09-30 DIAGNOSIS — Z881 Allergy status to other antibiotic agents status: Secondary | ICD-10-CM | POA: Diagnosis not present

## 2018-09-30 DIAGNOSIS — K089 Disorder of teeth and supporting structures, unspecified: Secondary | ICD-10-CM

## 2018-09-30 DIAGNOSIS — F17210 Nicotine dependence, cigarettes, uncomplicated: Secondary | ICD-10-CM | POA: Diagnosis present

## 2018-09-30 DIAGNOSIS — Z833 Family history of diabetes mellitus: Secondary | ICD-10-CM

## 2018-09-30 DIAGNOSIS — R7881 Bacteremia: Secondary | ICD-10-CM | POA: Diagnosis present

## 2018-09-30 DIAGNOSIS — E861 Hypovolemia: Secondary | ICD-10-CM | POA: Diagnosis not present

## 2018-09-30 DIAGNOSIS — E872 Acidosis: Secondary | ICD-10-CM | POA: Diagnosis present

## 2018-09-30 DIAGNOSIS — E1151 Type 2 diabetes mellitus with diabetic peripheral angiopathy without gangrene: Secondary | ICD-10-CM | POA: Diagnosis present

## 2018-09-30 DIAGNOSIS — E7800 Pure hypercholesterolemia, unspecified: Secondary | ICD-10-CM | POA: Diagnosis not present

## 2018-09-30 DIAGNOSIS — I480 Paroxysmal atrial fibrillation: Secondary | ICD-10-CM | POA: Diagnosis present

## 2018-09-30 DIAGNOSIS — M79672 Pain in left foot: Secondary | ICD-10-CM | POA: Diagnosis present

## 2018-09-30 DIAGNOSIS — Z794 Long term (current) use of insulin: Secondary | ICD-10-CM

## 2018-09-30 DIAGNOSIS — Z823 Family history of stroke: Secondary | ICD-10-CM

## 2018-09-30 DIAGNOSIS — A4102 Sepsis due to Methicillin resistant Staphylococcus aureus: Secondary | ICD-10-CM | POA: Diagnosis present

## 2018-09-30 DIAGNOSIS — E1169 Type 2 diabetes mellitus with other specified complication: Secondary | ICD-10-CM | POA: Diagnosis present

## 2018-09-30 DIAGNOSIS — I340 Nonrheumatic mitral (valve) insufficiency: Secondary | ICD-10-CM | POA: Diagnosis not present

## 2018-09-30 DIAGNOSIS — I240 Acute coronary thrombosis not resulting in myocardial infarction: Secondary | ICD-10-CM | POA: Diagnosis not present

## 2018-09-30 DIAGNOSIS — R768 Other specified abnormal immunological findings in serum: Secondary | ICD-10-CM | POA: Diagnosis not present

## 2018-09-30 DIAGNOSIS — I513 Intracardiac thrombosis, not elsewhere classified: Secondary | ICD-10-CM | POA: Diagnosis present

## 2018-09-30 DIAGNOSIS — J431 Panlobular emphysema: Secondary | ICD-10-CM | POA: Diagnosis not present

## 2018-09-30 DIAGNOSIS — M869 Osteomyelitis, unspecified: Secondary | ICD-10-CM

## 2018-09-30 DIAGNOSIS — I10 Essential (primary) hypertension: Secondary | ICD-10-CM | POA: Diagnosis not present

## 2018-09-30 DIAGNOSIS — I5022 Chronic systolic (congestive) heart failure: Secondary | ICD-10-CM | POA: Diagnosis present

## 2018-09-30 DIAGNOSIS — E1142 Type 2 diabetes mellitus with diabetic polyneuropathy: Secondary | ICD-10-CM | POA: Diagnosis present

## 2018-09-30 DIAGNOSIS — Z89512 Acquired absence of left leg below knee: Secondary | ICD-10-CM | POA: Diagnosis not present

## 2018-09-30 DIAGNOSIS — E119 Type 2 diabetes mellitus without complications: Secondary | ICD-10-CM

## 2018-09-30 DIAGNOSIS — N183 Chronic kidney disease, stage 3 unspecified: Secondary | ICD-10-CM | POA: Diagnosis present

## 2018-09-30 DIAGNOSIS — R451 Restlessness and agitation: Secondary | ICD-10-CM | POA: Diagnosis not present

## 2018-09-30 DIAGNOSIS — D509 Iron deficiency anemia, unspecified: Secondary | ICD-10-CM | POA: Diagnosis present

## 2018-09-30 DIAGNOSIS — Z978 Presence of other specified devices: Secondary | ICD-10-CM | POA: Diagnosis not present

## 2018-09-30 DIAGNOSIS — I130 Hypertensive heart and chronic kidney disease with heart failure and stage 1 through stage 4 chronic kidney disease, or unspecified chronic kidney disease: Secondary | ICD-10-CM | POA: Diagnosis present

## 2018-09-30 DIAGNOSIS — Z79899 Other long term (current) drug therapy: Secondary | ICD-10-CM

## 2018-09-30 DIAGNOSIS — R509 Fever, unspecified: Secondary | ICD-10-CM | POA: Insufficient documentation

## 2018-09-30 DIAGNOSIS — I272 Pulmonary hypertension, unspecified: Secondary | ICD-10-CM | POA: Diagnosis not present

## 2018-09-30 DIAGNOSIS — J430 Unilateral pulmonary emphysema [MacLeod's syndrome]: Secondary | ICD-10-CM | POA: Diagnosis not present

## 2018-09-30 DIAGNOSIS — I361 Nonrheumatic tricuspid (valve) insufficiency: Secondary | ICD-10-CM | POA: Diagnosis not present

## 2018-09-30 DIAGNOSIS — E785 Hyperlipidemia, unspecified: Secondary | ICD-10-CM | POA: Diagnosis present

## 2018-09-30 DIAGNOSIS — Z89432 Acquired absence of left foot: Secondary | ICD-10-CM | POA: Diagnosis not present

## 2018-09-30 DIAGNOSIS — Z9119 Patient's noncompliance with other medical treatment and regimen: Secondary | ICD-10-CM

## 2018-09-30 DIAGNOSIS — I071 Rheumatic tricuspid insufficiency: Secondary | ICD-10-CM | POA: Diagnosis not present

## 2018-09-30 DIAGNOSIS — B965 Pseudomonas (aeruginosa) (mallei) (pseudomallei) as the cause of diseases classified elsewhere: Secondary | ICD-10-CM | POA: Diagnosis not present

## 2018-09-30 DIAGNOSIS — D72824 Basophilia: Secondary | ICD-10-CM

## 2018-09-30 DIAGNOSIS — M86672 Other chronic osteomyelitis, left ankle and foot: Secondary | ICD-10-CM | POA: Diagnosis present

## 2018-09-30 DIAGNOSIS — G6282 Radiation-induced polyneuropathy: Secondary | ICD-10-CM | POA: Diagnosis not present

## 2018-09-30 DIAGNOSIS — E871 Hypo-osmolality and hyponatremia: Secondary | ICD-10-CM | POA: Diagnosis not present

## 2018-09-30 DIAGNOSIS — I081 Rheumatic disorders of both mitral and tricuspid valves: Secondary | ICD-10-CM | POA: Diagnosis present

## 2018-09-30 DIAGNOSIS — Z89412 Acquired absence of left great toe: Secondary | ICD-10-CM | POA: Diagnosis not present

## 2018-09-30 DIAGNOSIS — E1122 Type 2 diabetes mellitus with diabetic chronic kidney disease: Secondary | ICD-10-CM | POA: Diagnosis present

## 2018-09-30 DIAGNOSIS — Z1623 Resistance to quinolones and fluoroquinolones: Secondary | ICD-10-CM | POA: Diagnosis present

## 2018-09-30 DIAGNOSIS — Z765 Malingerer [conscious simulation]: Secondary | ICD-10-CM

## 2018-09-30 DIAGNOSIS — R634 Abnormal weight loss: Secondary | ICD-10-CM | POA: Diagnosis present

## 2018-09-30 DIAGNOSIS — E1165 Type 2 diabetes mellitus with hyperglycemia: Secondary | ICD-10-CM | POA: Diagnosis not present

## 2018-09-30 DIAGNOSIS — B9562 Methicillin resistant Staphylococcus aureus infection as the cause of diseases classified elsewhere: Secondary | ICD-10-CM | POA: Diagnosis not present

## 2018-09-30 DIAGNOSIS — G629 Polyneuropathy, unspecified: Secondary | ICD-10-CM

## 2018-09-30 DIAGNOSIS — D500 Iron deficiency anemia secondary to blood loss (chronic): Secondary | ICD-10-CM | POA: Diagnosis not present

## 2018-09-30 DIAGNOSIS — D649 Anemia, unspecified: Secondary | ICD-10-CM

## 2018-09-30 DIAGNOSIS — J449 Chronic obstructive pulmonary disease, unspecified: Secondary | ICD-10-CM | POA: Diagnosis present

## 2018-09-30 DIAGNOSIS — A419 Sepsis, unspecified organism: Secondary | ICD-10-CM | POA: Diagnosis present

## 2018-09-30 DIAGNOSIS — D638 Anemia in other chronic diseases classified elsewhere: Secondary | ICD-10-CM | POA: Diagnosis present

## 2018-09-30 DIAGNOSIS — Z89422 Acquired absence of other left toe(s): Secondary | ICD-10-CM | POA: Diagnosis not present

## 2018-09-30 DIAGNOSIS — I998 Other disorder of circulatory system: Secondary | ICD-10-CM | POA: Diagnosis present

## 2018-09-30 DIAGNOSIS — E875 Hyperkalemia: Secondary | ICD-10-CM | POA: Diagnosis present

## 2018-09-30 DIAGNOSIS — G589 Mononeuropathy, unspecified: Secondary | ICD-10-CM | POA: Diagnosis not present

## 2018-09-30 DIAGNOSIS — Z880 Allergy status to penicillin: Secondary | ICD-10-CM

## 2018-09-30 DIAGNOSIS — Z7902 Long term (current) use of antithrombotics/antiplatelets: Secondary | ICD-10-CM

## 2018-09-30 DIAGNOSIS — D72829 Elevated white blood cell count, unspecified: Secondary | ICD-10-CM | POA: Insufficient documentation

## 2018-09-30 DIAGNOSIS — Z8249 Family history of ischemic heart disease and other diseases of the circulatory system: Secondary | ICD-10-CM

## 2018-09-30 DIAGNOSIS — B961 Klebsiella pneumoniae [K. pneumoniae] as the cause of diseases classified elsewhere: Secondary | ICD-10-CM | POA: Diagnosis not present

## 2018-09-30 DIAGNOSIS — Z9114 Patient's other noncompliance with medication regimen: Secondary | ICD-10-CM

## 2018-09-30 DIAGNOSIS — F17200 Nicotine dependence, unspecified, uncomplicated: Secondary | ICD-10-CM | POA: Diagnosis not present

## 2018-09-30 DIAGNOSIS — R011 Cardiac murmur, unspecified: Secondary | ICD-10-CM | POA: Diagnosis not present

## 2018-09-30 DIAGNOSIS — T8781 Dehiscence of amputation stump: Secondary | ICD-10-CM

## 2018-09-30 HISTORY — DX: Peripheral vascular disease, unspecified: I73.9

## 2018-09-30 HISTORY — DX: Unspecified viral hepatitis C without hepatic coma: B19.20

## 2018-09-30 LAB — COMPREHENSIVE METABOLIC PANEL
ALT: 8 U/L (ref 0–44)
AST: 12 U/L — ABNORMAL LOW (ref 15–41)
Albumin: 1.9 g/dL — ABNORMAL LOW (ref 3.5–5.0)
Alkaline Phosphatase: 107 U/L (ref 38–126)
Anion gap: 7 (ref 5–15)
BUN: 24 mg/dL — ABNORMAL HIGH (ref 6–20)
CO2: 22 mmol/L (ref 22–32)
Calcium: 7.8 mg/dL — ABNORMAL LOW (ref 8.9–10.3)
Chloride: 103 mmol/L (ref 98–111)
Creatinine, Ser: 1.42 mg/dL — ABNORMAL HIGH (ref 0.61–1.24)
GFR calc Af Amer: >60 mL/min (ref >60)
GFR calc non Af Amer: 54 mL/min — ABNORMAL LOW (ref >60)
Glucose, Bld: 111 mg/dL — ABNORMAL HIGH (ref 70–99)
Potassium: 4.6 mmol/L (ref 3.5–5.1)
Sodium: 132 mmol/L — ABNORMAL LOW (ref 135–145)
Total Bilirubin: 1.1 mg/dL (ref 0.3–1.2)
Total Protein: 8.7 g/dL — ABNORMAL HIGH (ref 6.5–8.1)

## 2018-09-30 LAB — BLOOD CULTURE ID PANEL (REFLEXED)

## 2018-09-30 LAB — CBC WITH DIFFERENTIAL/PLATELET
Abs Immature Granulocytes: 0.2 10*3/uL — ABNORMAL HIGH (ref 0.00–0.07)
Basophils Absolute: 0.1 10*3/uL (ref 0.0–0.1)
Basophils Relative: 0 %
Eosinophils Absolute: 0 10*3/uL (ref 0.0–0.5)
Eosinophils Relative: 0 %
HCT: 22 % — ABNORMAL LOW (ref 39.0–52.0)
Hemoglobin: 6.9 g/dL — CL (ref 13.0–17.0)
Immature Granulocytes: 1 %
Lymphocytes Relative: 6 %
Lymphs Abs: 1.4 10*3/uL (ref 0.7–4.0)
MCH: 21.6 pg — ABNORMAL LOW (ref 26.0–34.0)
MCHC: 31.4 g/dL (ref 30.0–36.0)
MCV: 68.8 fL — ABNORMAL LOW (ref 80.0–100.0)
Monocytes Absolute: 1.1 10*3/uL — ABNORMAL HIGH (ref 0.1–1.0)
Monocytes Relative: 5 %
Neutro Abs: 22.2 10*3/uL — ABNORMAL HIGH (ref 1.7–7.7)
Neutrophils Relative %: 88 %
Platelets: 399 10*3/uL (ref 150–400)
RBC: 3.2 MIL/uL — ABNORMAL LOW (ref 4.22–5.81)
RDW: 18.2 % — ABNORMAL HIGH (ref 11.5–15.5)
WBC: 25 10*3/uL — ABNORMAL HIGH (ref 4.0–10.5)
nRBC: 0 % (ref 0.0–0.2)

## 2018-09-30 LAB — URINALYSIS, ROUTINE W REFLEX MICROSCOPIC
Bilirubin Urine: NEGATIVE
Glucose, UA: NEGATIVE mg/dL
Hgb urine dipstick: NEGATIVE
Ketones, ur: NEGATIVE mg/dL
Leukocytes,Ua: NEGATIVE
Nitrite: NEGATIVE
Protein, ur: 100 mg/dL — AB
Specific Gravity, Urine: 1.024 (ref 1.005–1.030)
pH: 5 (ref 5.0–8.0)

## 2018-09-30 LAB — ABO/RH: ABO/RH(D): B NEG

## 2018-09-30 LAB — GLUCOSE, CAPILLARY
Glucose-Capillary: 144 mg/dL — ABNORMAL HIGH (ref 70–99)
Glucose-Capillary: 214 mg/dL — ABNORMAL HIGH (ref 70–99)
Glucose-Capillary: 187 mg/dL — ABNORMAL HIGH (ref 70–99)
Glucose-Capillary: 246 mg/dL — ABNORMAL HIGH (ref 70–99)

## 2018-09-30 LAB — IRON AND TIBC
Iron: 12 ug/dL — ABNORMAL LOW (ref 45–182)
Saturation Ratios: 7 % — ABNORMAL LOW (ref 17.9–39.5)
TIBC: 177 ug/dL — ABNORMAL LOW (ref 250–450)
UIBC: 165 ug/dL

## 2018-09-30 LAB — RAPID URINE DRUG SCREEN, HOSP PERFORMED
Amphetamines: NOT DETECTED
Barbiturates: NOT DETECTED
Benzodiazepines: NOT DETECTED
Cocaine: NOT DETECTED
Opiates: POSITIVE — AB
Tetrahydrocannabinol: NOT DETECTED

## 2018-09-30 LAB — LACTIC ACID, PLASMA: Lactic Acid, Venous: 1.4 mmol/L (ref 0.5–1.9)

## 2018-09-30 LAB — PROTIME-INR
INR: 1.4 — ABNORMAL HIGH (ref 0.8–1.2)
Prothrombin Time: 16.7 seconds — ABNORMAL HIGH (ref 11.4–15.2)

## 2018-09-30 LAB — MRSA PCR SCREENING: MRSA by PCR: NEGATIVE

## 2018-09-30 LAB — PREPARE RBC (CROSSMATCH)

## 2018-09-30 LAB — SARS CORONAVIRUS 2 BY RT PCR (HOSPITAL ORDER, PERFORMED IN ~~LOC~~ HOSPITAL LAB): SARS Coronavirus 2: NEGATIVE

## 2018-09-30 LAB — CBG MONITORING, ED: Glucose-Capillary: 121 mg/dL — ABNORMAL HIGH (ref 70–99)

## 2018-09-30 LAB — FERRITIN: Ferritin: 164 ng/mL (ref 24–336)

## 2018-09-30 MED ORDER — NICOTINE 21 MG/24HR TD PT24
21.0000 mg | MEDICATED_PATCH | Freq: Every day | TRANSDERMAL | Status: DC
  Administered 2018-09-30 – 2018-10-14 (×15): 21 mg via TRANSDERMAL
  Filled 2018-09-30 (×16): qty 1

## 2018-09-30 MED ORDER — INSULIN NPH (HUMAN) (ISOPHANE) 100 UNIT/ML ~~LOC~~ SUSP
10.0000 [IU] | Freq: Two times a day (BID) | SUBCUTANEOUS | Status: DC
  Administered 2018-09-30 – 2018-10-11 (×17): 10 [IU] via SUBCUTANEOUS
  Filled 2018-09-30 (×3): qty 10

## 2018-09-30 MED ORDER — INSULIN ASPART 100 UNIT/ML ~~LOC~~ SOLN
0.0000 [IU] | Freq: Three times a day (TID) | SUBCUTANEOUS | Status: DC
  Administered 2018-09-30: 5 [IU] via SUBCUTANEOUS
  Administered 2018-10-01 – 2018-10-10 (×9): 2 [IU] via SUBCUTANEOUS
  Administered 2018-10-10: 08:00:00 3 [IU] via SUBCUTANEOUS
  Administered 2018-10-13 – 2018-10-14 (×2): 2 [IU] via SUBCUTANEOUS
  Filled 2018-09-30: qty 0.15

## 2018-09-30 MED ORDER — GABAPENTIN 300 MG PO CAPS
300.0000 mg | ORAL_CAPSULE | Freq: Every day | ORAL | Status: DC
  Administered 2018-09-30 – 2018-10-03 (×4): 300 mg via ORAL
  Filled 2018-09-30 (×4): qty 1

## 2018-09-30 MED ORDER — CARVEDILOL 12.5 MG PO TABS: 12.5000 mg | ORAL_TABLET | Freq: Two times a day (BID) | ORAL | Status: DC

## 2018-09-30 MED ORDER — ACETAMINOPHEN 325 MG PO TABS
650.0000 mg | ORAL_TABLET | Freq: Once | ORAL | Status: AC | PRN
  Administered 2018-09-30: 650 mg via ORAL
  Filled 2018-09-30: qty 2

## 2018-09-30 MED ORDER — FENTANYL CITRATE (PF) 100 MCG/2ML IJ SOLN
50.0000 ug | Freq: Once | INTRAMUSCULAR | Status: AC
  Administered 2018-09-30: 50 ug via INTRAVENOUS
  Filled 2018-09-30: qty 2

## 2018-09-30 MED ORDER — SODIUM CHLORIDE 0.9 % IV SOLN
10.0000 mL/h | Freq: Once | INTRAVENOUS | Status: AC
  Administered 2018-09-30: 10 mL/h via INTRAVENOUS

## 2018-09-30 MED ORDER — CARVEDILOL 12.5 MG PO TABS
12.5000 mg | ORAL_TABLET | Freq: Two times a day (BID) | ORAL | Status: DC
  Administered 2018-10-01 – 2018-10-14 (×26): 12.5 mg via ORAL
  Filled 2018-09-30 (×28): qty 1

## 2018-09-30 MED ORDER — OXYCODONE-ACETAMINOPHEN 5-325 MG PO TABS
1.0000 | ORAL_TABLET | Freq: Four times a day (QID) | ORAL | Status: DC | PRN
  Administered 2018-09-30 – 2018-10-01 (×3): 1 via ORAL
  Filled 2018-09-30 (×3): qty 1

## 2018-09-30 MED ORDER — ONDANSETRON HCL 4 MG/2ML IJ SOLN
4.0000 mg | Freq: Four times a day (QID) | INTRAMUSCULAR | Status: DC | PRN
  Administered 2018-10-01 – 2018-10-10 (×3): 4 mg via INTRAVENOUS
  Filled 2018-09-30 (×3): qty 2

## 2018-09-30 MED ORDER — ACETAMINOPHEN 650 MG RE SUPP: 650.0000 mg | Freq: Four times a day (QID) | RECTAL | Status: DC | PRN

## 2018-09-30 MED ORDER — PIPERACILLIN-TAZOBACTAM 3.375 G IVPB 30 MIN
3.3750 g | Freq: Once | INTRAVENOUS | Status: AC
  Administered 2018-09-30: 3.375 g via INTRAVENOUS
  Filled 2018-09-30: qty 50

## 2018-09-30 MED ORDER — AMLODIPINE BESYLATE 10 MG PO TABS
10.0000 mg | ORAL_TABLET | Freq: Every day | ORAL | Status: DC
  Administered 2018-09-30 – 2018-10-14 (×14): 10 mg via ORAL
  Filled 2018-09-30 (×15): qty 1

## 2018-09-30 MED ORDER — ACETAMINOPHEN 325 MG PO TABS
650.0000 mg | ORAL_TABLET | Freq: Four times a day (QID) | ORAL | Status: DC | PRN
  Administered 2018-10-07 – 2018-10-11 (×4): 650 mg via ORAL
  Filled 2018-09-30 (×4): qty 2

## 2018-09-30 MED ORDER — CLOPIDOGREL BISULFATE 75 MG PO TABS
75.0000 mg | ORAL_TABLET | Freq: Every day | ORAL | Status: DC
  Filled 2018-09-30: qty 1

## 2018-09-30 MED ORDER — VANCOMYCIN HCL 10 G IV SOLR
1750.0000 mg | Freq: Once | INTRAVENOUS | Status: AC
  Administered 2018-09-30: 1750 mg via INTRAVENOUS
  Filled 2018-09-30: qty 1750

## 2018-09-30 MED ORDER — PIPERACILLIN-TAZOBACTAM 3.375 G IVPB
3.3750 g | Freq: Three times a day (TID) | INTRAVENOUS | Status: DC
  Administered 2018-09-30 – 2018-10-02 (×7): 3.375 g via INTRAVENOUS
  Filled 2018-09-30 (×7): qty 50

## 2018-09-30 MED ORDER — SODIUM CHLORIDE 0.9 % IV SOLN
INTRAVENOUS | Status: DC
  Administered 2018-09-30 – 2018-10-05 (×6): via INTRAVENOUS

## 2018-09-30 MED ORDER — ONDANSETRON HCL 4 MG PO TABS: 4.0000 mg | ORAL_TABLET | Freq: Four times a day (QID) | ORAL | Status: DC | PRN

## 2018-09-30 MED ORDER — ATORVASTATIN CALCIUM 40 MG PO TABS
40.0000 mg | ORAL_TABLET | Freq: Every day | ORAL | Status: DC
  Administered 2018-09-30 – 2018-10-13 (×14): 40 mg via ORAL
  Filled 2018-09-30 (×14): qty 1

## 2018-09-30 MED ORDER — AMLODIPINE BESYLATE 10 MG PO TABS: 10.0000 mg | ORAL_TABLET | Freq: Every day | ORAL | Status: DC

## 2018-09-30 MED ORDER — ENOXAPARIN SODIUM 40 MG/0.4ML ~~LOC~~ SOLN
40.0000 mg | SUBCUTANEOUS | Status: DC
  Administered 2018-10-01: 40 mg via SUBCUTANEOUS
  Filled 2018-09-30: qty 0.4

## 2018-09-30 MED ORDER — CHLORHEXIDINE GLUCONATE CLOTH 2 % EX PADS
6.0000 | MEDICATED_PAD | Freq: Every day | CUTANEOUS | Status: DC
  Administered 2018-10-01 – 2018-10-14 (×12): 6 via TOPICAL

## 2018-09-30 MED ORDER — CLOPIDOGREL BISULFATE 75 MG PO TABS: 75.0000 mg | ORAL_TABLET | Freq: Every day | ORAL | Status: DC

## 2018-09-30 MED ORDER — VANCOMYCIN HCL 10 G IV SOLR
1750.0000 mg | INTRAVENOUS | Status: DC
  Administered 2018-09-30: 1750 mg via INTRAVENOUS
  Filled 2018-09-30: qty 1750

## 2018-09-30 NOTE — ED Notes (Signed)
Pt has $620 cash on him. Pt does not want it locked up in security. The money was counted in front of the patient and with Helaine Chess, RN.

## 2018-09-30 NOTE — Consult Note (Signed)
Hospital Consult    Reason for Consult:  Osteo of left tma Referring Physician:  Dr. Nevada Crane MRN #:  [id number]  History of Present Illness: This is a 58 y.o. male with vascular history significant for transmetatarsal amputation that per patient was performed at [facility].  I have records more recently of him being treated at [facility] with the wound center and recent underwent posterior tibial artery angioplasty and stenting of a distal SFA with a Zilver stent.  Per report he was prescribed Plavix but states he does not know that he is taking these.  Vascular risk factors include hyperlipidemia, hypertension, diabetes, ongoing smoking status.  He presents with fever leukocytosis concern for persistent infection of left foot wound.  Past Medical History:  Diagnosis Date  . Diabetes mellitus without complication ([place])   . Heart attack ([place])   . Hepatitis C   . HLD (hyperlipidemia)   . HTN (hypertension)   . Peripheral vascular disease ([place])   . Systolic CHF [facility])     Past Surgical History:  Procedure Laterality Date  . ABSCESS DRAINAGE      Allergies  Allergen Reactions  . Amoxicillin-Pot Clavulanate Nausea And Vomiting    Prior to Admission medications   Medication Sig Start Date End Date Taking? Authorizing Provider  amLODipine (NORVASC) 10 MG tablet Take 10 mg by mouth daily. 10/24/17   [provider]  atorvastatin (LIPITOR) 40 MG tablet Take 40 mg by mouth at bedtime. 10/24/17   [provider]  carvedilol (COREG) 12.5 MG tablet Take 12.5 mg by mouth 2 (two) times daily. 10/24/17   [provider]  clopidogrel (PLAVIX) 75 MG tablet Take 75 mg by mouth daily. 10/24/17   [provider]  gabapentin (NEURONTIN) 300 MG capsule Take 300 mg by mouth at bedtime. 02/14/18   [provider]  HUMULIN N 100 UNIT/ML injection Inject 10 Units into the skin 2 (two) times daily. 10/24/17   [provider]  HUMULIN R 100 UNIT/ML  injection Inject 2 Units into the skin 3 (three) times daily. 10/24/17   [provider]  losartan (COZAAR) 25 MG tablet Take 25 mg by mouth daily. 10/24/17   [provider]  mometasone-formoterol (DULERA) 200-5 MCG/ACT AERO Inhale 2 puffs into the lungs 2 (two) times daily for 29 days. 02/24/18 03/25/18  Bettey Costa, MD  mupirocin ointment (BACTROBAN) 2 % Apply 1 application topically daily. 02/15/18   [provider]  nicotine (NICODERM CQ - DOSED IN MG/24 HOURS) 21 mg/24hr patch Place 1 patch (21 mg total) onto the skin daily. 02/24/18 02/24/19  Bettey Costa, MD    Social History   Socioeconomic History  . Marital status: Legally Separated    Spouse name: Not on file  . Number of children: Not on file  . Years of education: Not on file  . Highest education level: Not on file  Occupational History  . Not on file  Social Needs  . Financial resource strain: Not on file  . Food insecurity    Worry: Not on file    Inability: Not on file  . Transportation needs    Medical: Not on file    Non-medical: Not on file  Tobacco Use  . Smoking status: Heavy Tobacco Smoker  . Smokeless tobacco: Never Used  Substance and Sexual Activity  . Alcohol use: Yes  . Drug use: Yes    Comment: Heroin  . Sexual activity: Not Currently  Lifestyle  . Physical activity  Days per week: Not on file    Minutes per session: Not on file  . Stress: Not on file  Relationships  . Social Herbalist on phone: Not on file    Gets together: Not on file    Attends religious service: Not on file    Active member of club or organization: Not on file    Attends meetings of clubs or organizations: Not on file    Relationship status: Not on file  . Intimate partner violence    Fear of current or ex partner: Not on file    Emotionally abused: Not on file    Physically abused: Not on file    Forced sexual activity: Not on file  Other Topics Concern  . Not on file  Social History  Narrative  . Not on file     Family History  Problem Relation Age of Onset  . Cancer Father   . Hypertension Father   . Heart disease Father   . Diabetes Father   . Stroke Sister   . Stroke Brother     ROS:  Cardiovascular: []  chest pain/pressure []  palpitations []  SOB lying flat []  DOE []  pain in legs while walking []  pain in legs at rest []  pain in legs at night []  non-healing ulcers []  hx of DVT [x]  swelling in legs  Pulmonary: []  productive cough []  asthma/wheezing []  home O2  Neurologic: []  weakness in []  arms []  legs []  numbness in []  arms []  legs []  hx of CVA []  mini stroke [] difficulty speaking or slurred speech []  temporary loss of vision in one eye []  dizziness  Hematologic: []  hx of cancer []  bleeding problems []  problems with blood clotting easily  Endocrine:   []  diabetes []  thyroid disease  GI []  vomiting blood []  blood in stool  GU: []  CKD/renal failure []  HD--[]  M/W/F or []  T/T/S []  burning with urination []  blood in urine  Psychiatric: []  anxiety []  depression  Musculoskeletal: []  arthritis []  joint pain  Integumentary: []  rashes [x]  ulcers  Constitutional: []  fever []  chills   Physical Examination  Vitals:   09/30/18 0720 09/30/18 0800  BP: 106/71 111/79  Pulse: 83 81  Resp: 15 18  Temp: 98 F (36.7 C)   SpO2: 96% 98%   Body mass index is 20.83 kg/m.  General:  nad HENT: WNL, normocephalic Pulmonary: normal non-labored breathing Cardiac: Right common femoral pulse easily palpable left pulse 1+ I cannot readily feel popliteal pulse Strong posterior tibial signal on the left Abdomen: soft, NT/ND, no masses Extremities:       Neurologic: A&O X 3; Appropriate Affect ; SENSATION: normal; MOTOR FUNCTION:  moving all extremities equally. Speech is fluent/normal   CBC    Component Value Date/Time   WBC 25.0 (H) 09/30/2018 0054   RBC 3.20 (L) 09/30/2018 0054   HGB 6.9 (LL) 09/30/2018 0054   HCT 22.0  (L) 09/30/2018 0054   PLT 399 09/30/2018 0054   MCV 68.8 (L) 09/30/2018 0054   MCH 21.6 (L) 09/30/2018 0054   MCHC 31.4 09/30/2018 0054   RDW 18.2 (H) 09/30/2018 0054   LYMPHSABS 1.4 09/30/2018 0054   MONOABS 1.1 (H) 09/30/2018 0054   EOSABS 0.0 09/30/2018 0054   BASOSABS 0.1 09/30/2018 0054    BMET    Component Value Date/Time   NA 132 (L) 09/30/2018 0054   K 4.6 09/30/2018 0054   CL 103 09/30/2018 0054   CO2 22 09/30/2018 0054  GLUCOSE 111 (H) 09/30/2018 0054   BUN 24 (H) 09/30/2018 0054   CREATININE 1.42 (H) 09/30/2018 0054   CALCIUM 7.8 (L) 09/30/2018 0054   GFRNONAA 54 (L) 09/30/2018 0054   GFRAA >60 09/30/2018 0054    COAGS: Lab Results  Component Value Date   INR 1.4 (H) 09/30/2018     Non-Invasive Vascular Imaging:   ABI and duplex pending  ASSESSMENT/PLAN: This is a 58 y.o. male 58 year old male with vascular history significant for transmetatarsal amputation on the left per patient was performed at [facility].  I do not have record of this.  Also has recent balloon angioplasty of the left posterior tibial artery and stent of the distal SFA.  By exam this is likely patent given strong posterior tibial signal.  I have ordered left lower extremity duplex and ABIs.  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  Given his young age he would be best served with below-knee amputation on the left and focusing on medical therapy to prevent any procedures on the right lower extremity.  This would include control of his hypertension and hyperlipidemia and he certainly needs a statin drug and antiplatelet for this-Lipitor and Plavix have been ordered.  Plavix would be the best choice at this time given recent placement  of drug-eluting stent.  Patient is unwilling to consider amputation at this time.  Any procedures would need to be performed at [facility] but if he is not agreeable that I would not transfer patient.  I will follow-up studies tomorrow.   Kynadi Dragos C. Donzetta Matters, MD [facility] Office: [phone] Pager: [phone]

## 2018-09-30 NOTE — Consult Note (Signed)
[place] Nurse wound consult note Reason for Consult: [place] is simultaneously consulted with Vascular surgery.  Dr. Donzetta Matters has been paged (according to Dr. Juel Burrow note from this morning) and will see the patient for his left foot nonhealing wound. Wound type: Infectious  WOC nursing team will not see or follow, deferring to the expertise of Vascular surgery for the care of this complex situation.   Thanks, Maudie Flakes, MSN, RN, [place], Arther Abbott  Pager# [phone]

## 2018-09-30 NOTE — ED Notes (Signed)
Date and time results received: 09/30/18 0149  Test: Hgb Critical Value: 6.9 Name of Provider Notified: Sam P. EDPA Orders Received? Or Actions Taken?: see orders

## 2018-09-30 NOTE — ED Notes (Signed)
Dressing to the left foot was removed, dressing was dried, old drainage. Pt said it had been 2 days or so since he changed it, says it is suppose to change it everyday. Odorous.

## 2018-09-30 NOTE — ED Provider Notes (Signed)
[facility] DEPT Provider Note   CSN: [id number] Arrival date & time: 09/30/18  0019     History   Chief Complaint Chief Complaint  Patient presents with  . Foot Pain    HPI Daniel Moon is a 58 y.o. male with a hx of DM, HTN, hyperlipidemia, COPD, systolic CHF last EF [id number], & PVD who presents to the ED for fever tonight. Patient states he feels generally poorly & has let his health go. He states he has had increased pain to the LLE for several days as well as some increased swelling. States pain is constant, severe, no alleviating/aggravating factors. He notes he developed fever over past 24 hours, has had some mild chills. Denies URI sxs, cough, dyspnea, chest pain, or N/V/D. Has baseline unchanged peripheral neuropathy. He does not follow w/ surgeon who performed previous LLE amputation.  Patient states he has been on abx for his foot in the past, not on any currently.    Per chart review: Surgery by Dr. Violet Baldy 08/16/18 for his chronic limb threatening ischemia with nonhealing TMA stump as well as recurrent osteomyelitis of left TMA stump- Left SFA balloon angioplasty and stent placement 6 x 42 mm as well as left posterior tibial artery balloon angioplasty performed.    HPI  Past Medical History:  Diagnosis Date  . Diabetes mellitus without complication ([place])   . Heart attack ([place])   . HLD (hyperlipidemia)   . HTN (hypertension)   . Systolic CHF [facility])     Patient Active Problem List   Diagnosis Date Noted  . Multifocal pneumonia 02/23/2018  . Acute respiratory failure with hypoxia ([place]) 02/23/2018  . HTN (hypertension) 02/23/2018  . Diabetes ([place]) 02/23/2018  . HLD (hyperlipidemia) 02/23/2018  . COPD with acute exacerbation ([place]) 02/23/2018    Past Surgical History:  Procedure Laterality Date  . ABSCESS DRAINAGE          Home Medications    Prior to Admission medications   Medication Sig Start Date End Date Taking?  Authorizing Provider  amLODipine (NORVASC) 10 MG tablet Take 10 mg by mouth daily. 10/24/17   [provider]  atorvastatin (LIPITOR) 40 MG tablet Take 40 mg by mouth at bedtime. 10/24/17   [provider]  carvedilol (COREG) 12.5 MG tablet Take 12.5 mg by mouth 2 (two) times daily. 10/24/17   [provider]  clopidogrel (PLAVIX) 75 MG tablet Take 75 mg by mouth daily. 10/24/17   [provider]  gabapentin (NEURONTIN) 300 MG capsule Take 300 mg by mouth at bedtime. 02/14/18   [provider]  HUMULIN N 100 UNIT/ML injection Inject 10 Units into the skin 2 (two) times daily. 10/24/17   [provider]  HUMULIN R 100 UNIT/ML injection Inject 2 Units into the skin 3 (three) times daily. 10/24/17   [provider]  losartan (COZAAR) 25 MG tablet Take 25 mg by mouth daily. 10/24/17   [provider]  mometasone-formoterol (DULERA) 200-5 MCG/ACT AERO Inhale 2 puffs into the lungs 2 (two) times daily for 29 days. 02/24/18 03/25/18  Bettey Costa, MD  mupirocin ointment (BACTROBAN) 2 % Apply 1 application topically daily. 02/15/18   [provider]  nicotine (NICODERM CQ - DOSED IN MG/24 HOURS) 21 mg/24hr patch Place 1 patch (21 mg total) onto the skin daily. 02/24/18 02/24/19  Bettey Costa, MD    Family History Family History  Problem Relation Age of Onset  . Cancer Father   .  Hypertension Father   . Heart disease Father   . Diabetes Father   . Stroke Sister   . Stroke Brother     Social History Social History   Tobacco Use  . Smoking status: Heavy Tobacco Smoker  . Smokeless tobacco: Never Used  Substance Use Topics  . Alcohol use: Yes  . Drug use: Yes    Comment: Heroin     Allergies   Patient has no known allergies.   Review of Systems Review of Systems  Constitutional: Positive for chills and fever.  HENT: Negative for congestion, ear pain and sore throat.   Respiratory: Negative for cough and shortness of  breath.   Cardiovascular: Positive for leg swelling. Negative for chest pain.  Gastrointestinal: Negative for abdominal pain, diarrhea and vomiting.  Musculoskeletal: Positive for arthralgias and myalgias.  Neurological: Negative for syncope.  All other systems reviewed and are negative.    Physical Exam Updated Vital Signs BP 140/78 (BP Location: Left Arm)   Pulse (!) 120   Temp (!) 103.5 F (39.7 C) (Rectal)   Resp 15   Ht 6\' 3"  (1.905 m)   Wt 81.6 kg   SpO2 100%   BMI 22.50 kg/m   Physical Exam Vitals signs and nursing note reviewed.  Constitutional:      General: He is not in acute distress.    Appearance: He is well-developed. He is not toxic-appearing.  HENT:     Head: Normocephalic and atraumatic.  Eyes:     General:        Right eye: No discharge.        Left eye: No discharge.     Conjunctiva/sclera: Conjunctivae normal.  Neck:     Musculoskeletal: Neck supple.  Cardiovascular:     Rate and Rhythm: Regular rhythm. Tachycardia present.     Comments: Dopplerable PT pulses bilaterally. Pulmonary:     Effort: Pulmonary effort is normal. No respiratory distress.     Breath sounds: Normal breath sounds. No wheezing, rhonchi or rales.  Abdominal:     General: There is no distension.     Palpations: Abdomen is soft.     Tenderness: There is no abdominal tenderness.  Musculoskeletal:     Comments: 2-3+ symmetric pitting edema to the lower legs. Left lower extremity as pictured below.  Status post transmetatarsal amputation.  Approximately 5 cm ulcer to the plantar aspect of the foot.  There are other small wounds noted.  Foot extending to the lower leg is erythematous and warm to the touch.  Mild tenderness to palpation noted.  Skin:    General: Skin is warm and dry.     Findings: No rash.  Neurological:     Mental Status: He is alert.     Comments: Clear speech.   Psychiatric:        Behavior: Behavior normal.         ED Treatments / Results  Labs  (all labs ordered are listed, but only abnormal results are displayed) Labs Reviewed  COMPREHENSIVE METABOLIC PANEL - Abnormal; Notable for the following components:      Result Value   Sodium 132 (*)    Glucose, Bld 111 (*)    BUN 24 (*)    Creatinine, Ser 1.42 (*)    Calcium 7.8 (*)    Total Protein 8.7 (*)    Albumin 1.9 (*)    AST 12 (*)    GFR calc non Af Amer 54 (*)    All  other components within normal limits  CBC WITH DIFFERENTIAL/PLATELET - Abnormal; Notable for the following components:   WBC 25.0 (*)    RBC 3.20 (*)    Hemoglobin 6.9 (*)    HCT 22.0 (*)    MCV 68.8 (*)    MCH 21.6 (*)    RDW 18.2 (*)    Neutro Abs 22.2 (*)    Monocytes Absolute 1.1 (*)    Abs Immature Granulocytes 0.20 (*)    All other components within normal limits  PROTIME-INR - Abnormal; Notable for the following components:   Prothrombin Time 16.7 (*)    INR 1.4 (*)    All other components within normal limits  CBG MONITORING, ED - Abnormal; Notable for the following components:   Glucose-Capillary 121 (*)    All other components within normal limits  CULTURE, BLOOD (ROUTINE X 2)  CULTURE, BLOOD (ROUTINE X 2)  SARS CORONAVIRUS 2 (HOSPITAL ORDER, [facility])  LACTIC ACID, PLASMA  LACTIC ACID, PLASMA  URINALYSIS, ROUTINE W REFLEX MICROSCOPIC  PREPARE RBC (CROSSMATCH)  TYPE AND SCREEN    EKG None  Radiology Dg Chest Port 1 View  Result Date: 09/30/2018 CLINICAL DATA:  Suspected sepsis. EXAM: PORTABLE CHEST 1 VIEW COMPARISON:  Radiograph 07/29/2018, 02/22/2018 FINDINGS: Improved aeration from prior exams. Mild cardiomegaly with unchanged mediastinal contours. No acute airspace disease. No pulmonary edema, pleural fluid, or pneumothorax. No acute osseous abnormalities. IMPRESSION: Mild cardiomegaly without acute abnormality. Electronically Signed   By: Keith Rake M.D.   On: 09/30/2018 01:28   Dg Foot Complete Left  Result Date: 09/30/2018 CLINICAL DATA:   Left foot pain. History of partial amputation. EXAM: LEFT FOOT - COMPLETE 3+ VIEW COMPARISON:  Radiograph 07/16/2018 FINDINGS: Prior transmetatarsal amputation. Soft tissue defect about the plantar lateral aspect of the foot. Osseous resorption with bony destructive change involving the in situ second through fifth metatarsals, progressed from prior exam. Interval bony destruction involving the cuboid with fragmentation laterally. Nonspecific decreased density involving the anterior cortex of the calcaneus. Generalized soft tissue edema. 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 Soft tissue defect overlies the plantar lateral aspect of the foot. Generalized soft tissue edema. Electronically Signed   By: Keith Rake M.D.   On: 09/30/2018 01:31    Procedures .Critical Care Performed by: Amaryllis Dyke, PA-C Authorized by: Amaryllis Dyke, PA-C     CRITICAL CARE Performed by: Kennith Maes   Total critical care time: 40 minutes  Critical care time was exclusive of separately billable procedures and treating other patients.  Critical care was necessary to treat or prevent imminent or life-threatening deterioration.  Critical care was time spent personally by me on the following activities: development of treatment plan with patient and/or surrogate as well as nursing, discussions with consultants, evaluation of patient's response to treatment, examination of patient, obtaining history from patient or surrogate, ordering and performing treatments and interventions, ordering and review of laboratory studies, ordering and review of radiographic studies, pulse oximetry and re-evaluation of patient's condition. (including critical care time)  Medications Ordered in ED Medications - No data to display   Initial Impression / Assessment and Plan / ED Course  I have reviewed the  triage vital signs and the nursing notes.  Pertinent labs & imaging results that were available during my care of the patient were reviewed by me and considered in my medical decision making (see  chart for details).   Patient presents to the emergency department for fever that began today, has had left lower extremity pain/swelling for several days now.  Patient is nontoxic-appearing, he is notably febrile to 103.5 on arrival with likely resultant tachycardia.  Source appears to be his left lower extremity-he has ulcer wound to the plantar surface with swelling and warmth, has had recurrent osteomyelitis, had recent stent placement to the left lower extremity 7/30 at [facility] PT pulse present.  Code sepsis initiated with antibiotics.  Patient is not hypotensive, will hold off on 30 cc/kg bolus at this time and continue to monitor given patient's history of CHF with an EF of 35%.  CBC: Leukocytosis at 25 with left shift.  Patient is critically anemic with a hemoglobin of 6.9, hematocrit 22.0, -this appears new, likely from his chronic disease processes.  1 unit of packed red blood cells ordered.  CMP: Electrolyte abnormalities as detailed above.  Renal function similar to prior. Lactic acid: WNL  Chest x-ray: Mild cardiomegaly without acute abnormality Left foot x-ray: Prior transmetatarsal amputation with osteomyelitis of the in situ second through fifth metatarsals as well as the cuboid. Soft tissue defect overlies the plantar lateral aspect of the foot. Generalized soft tissue edema  Patient with sepsis likely from left foot osteomyelitis, also with canemia receiving blood transfusion. Will consult hospitalist service for admission.   Findings and plan of care discussed with supervising physician Dr. Betsey Holiday who is in agreement.   02:27: CONSULT: Discussed with hospitalist Dr. Renaee Munda- accepts admission.   Final Clinical Impressions(s) / ED Diagnoses   Final diagnoses:   Osteomyelitis of left foot, unspecified type ([place])  Sepsis, due to unspecified organism, unspecified whether acute organ dysfunction present [facility])  Anemia, unspecified type    ED Discharge Orders    None       Amaryllis Dyke, PA-C 09/30/18 0229    Orpah Greek, MD 09/30/18 [phone]

## 2018-09-30 NOTE — H&P (Signed)
History and Physical  Daniel Moon [id number] DOB: 09-Sep-1960 DOA: 09/30/2018   PCP: Sanjuana Letters, MD   Patient coming from: Home   Chief Complaint: Fever   HPI: Daniel Moon is a 58 y.o. male with medical history significant for DM, peripheral neuropathy, nonhealing left transmetatarsal amputation wound and limb threatening ischemia being admitted with sepsis likely due to osteomyelitis. The patient is a poor historian but tells me he started having fevers today at home. He says he was feeling fine until today when he says his family heard him wake up yelling in his sleep. He was found to have a fever and his family called EMS. He last had balloon angioplasty in July and says he was given antibiotics after that procedure but didn't take them since they made him feel sick. Since that time, he has not seen a physician.   ED Course: He was found to be febrile, have a leukocytosis and XR of foot shows evidence of osteomyelitis. He was given IVF but gently due to his history of CHF, and his blood pressure is stable. He was given empiric vancomycin and zosyn.  Review of Systems: Please see HPI for pertinent positives and negatives. A complete 10 system review of systems are otherwise negative.  Past Medical History:  Diagnosis Date  . Diabetes mellitus without complication ([place])   . Heart attack ([place])   . Hepatitis C   . HLD (hyperlipidemia)   . HTN (hypertension)   . Peripheral vascular disease ([place])   . Systolic CHF [facility])    Past Surgical History:  Procedure Laterality Date  . ABSCESS DRAINAGE      Social History:  reports that he has been smoking. He has never used smokeless tobacco. He reports current alcohol use. He reports current drug use.   Allergies  Allergen Reactions  . Amoxicillin-Pot Clavulanate Nausea And Vomiting    Family History  Problem Relation Age of Onset  . Cancer Father   . Hypertension Father   . Heart disease Father   . Diabetes  Father   . Stroke Sister   . Stroke Brother      Prior to Admission medications   Medication Sig Start Date End Date Taking? Authorizing Provider  amLODipine (NORVASC) 10 MG tablet Take 10 mg by mouth daily. 10/24/17   [provider]  atorvastatin (LIPITOR) 40 MG tablet Take 40 mg by mouth at bedtime. 10/24/17   [provider]  carvedilol (COREG) 12.5 MG tablet Take 12.5 mg by mouth 2 (two) times daily. 10/24/17   [provider]  clopidogrel (PLAVIX) 75 MG tablet Take 75 mg by mouth daily. 10/24/17   [provider]  gabapentin (NEURONTIN) 300 MG capsule Take 300 mg by mouth at bedtime. 02/14/18   [provider]  HUMULIN N 100 UNIT/ML injection Inject 10 Units into the skin 2 (two) times daily. 10/24/17   [provider]  HUMULIN R 100 UNIT/ML injection Inject 2 Units into the skin 3 (three) times daily. 10/24/17   [provider]  losartan (COZAAR) 25 MG tablet Take 25 mg by mouth daily. 10/24/17   [provider]  mometasone-formoterol (DULERA) 200-5 MCG/ACT AERO Inhale 2 puffs into the lungs 2 (two) times daily for 29 days. 02/24/18 03/25/18  Bettey Costa, MD  mupirocin ointment (BACTROBAN) 2 % Apply 1 application topically daily. 02/15/18   [provider]  nicotine (NICODERM CQ - DOSED IN MG/24 HOURS) 21 mg/24hr patch Place 1 patch (21 mg  total) onto the skin daily. 02/24/18 02/24/19  Bettey Costa, MD    Physical Exam: BP (!) 146/91   Pulse (!) 110   Temp (!) 103.5 F (39.7 C) (Rectal)   Resp 17   Ht 6\' 3"  (1.905 m)   Wt 81.6 kg   SpO2 95%   BMI 22.50 kg/m   General:  Alert, oriented, calm, in no acute distress, but sleepy Eyes: EOMI, clear conjuctivae, white sclerea Neck: supple, no masses, trachea mildline  Cardiovascular: RRR, no murmurs or rubs, no peripheral edema  Respiratory: clear to auscultation bilaterally, no wheezes, no crackles  Abdomen: soft, nontender, nondistended, normal bowel tones heard   Skin: dry, no rashes  Musculoskeletal: he has chronic venous changes in both LE, left foot is wrapped but ER provider's pictures were reviewed and I also noted some erythema and warmth up the left shin, there is also tenderness Psychiatric: appropriate affect, normal speech  Neurologic: extraocular muscles intact, clear speech, moving all extremities with intact sensorium            Labs on Admission:  Basic Metabolic Panel: Recent Labs  Lab 09/30/18 0054  NA 132*  K 4.6  CL 103  CO2 22  GLUCOSE 111*  BUN 24*  CREATININE 1.42*  CALCIUM 7.8*   Liver Function Tests: Recent Labs  Lab 09/30/18 0054  AST 12*  ALT 8  ALKPHOS 107  BILITOT 1.1  PROT 8.7*  ALBUMIN 1.9*   No results for input(s): LIPASE, AMYLASE in the last 168 hours. No results for input(s): AMMONIA in the last 168 hours. CBC: Recent Labs  Lab 09/30/18 0054  WBC 25.0*  NEUTROABS 22.2*  HGB 6.9*  HCT 22.0*  MCV 68.8*  PLT 399   Cardiac Enzymes: No results for input(s): CKTOTAL, CKMB, CKMBINDEX, TROPONINI in the last 168 hours.  BNP (last 3 results) Recent Labs    02/22/18 2201  BNP 592.0*    ProBNP (last 3 results) No results for input(s): PROBNP in the last 8760 hours.  CBG: Recent Labs  Lab 09/30/18 0035  GLUCAP 121*    Radiological Exams on Admission: Dg Chest Port 1 View  Result Date: 09/30/2018 CLINICAL DATA:  Suspected sepsis. EXAM: PORTABLE CHEST 1 VIEW COMPARISON:  Radiograph 07/29/2018, 02/22/2018 FINDINGS: Improved aeration from prior exams. Mild cardiomegaly with unchanged mediastinal contours. No acute airspace disease. No pulmonary edema, pleural fluid, or pneumothorax. No acute osseous abnormalities. IMPRESSION: Mild cardiomegaly without acute abnormality. Electronically Signed   By: Keith Rake M.D.   On: 09/30/2018 01:28   Dg Foot Complete Left  Result Date: 09/30/2018 CLINICAL DATA:  Left foot pain. History of partial amputation. EXAM: LEFT FOOT - COMPLETE 3+ VIEW  COMPARISON:  Radiograph 07/16/2018 FINDINGS: Prior transmetatarsal amputation. Soft tissue defect about the plantar lateral aspect of the foot. Osseous resorption with bony destructive change involving the in situ second through fifth metatarsals, progressed from prior exam. Interval bony destruction involving the cuboid with fragmentation laterally. Nonspecific decreased density involving the anterior cortex of the calcaneus. Generalized soft tissue edema. 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 Soft tissue defect overlies the plantar lateral aspect of the foot. Generalized soft tissue edema. Electronically Signed   By: Keith Rake M.D.   On: 09/30/2018 01:31   Assessment/Plan Present on Admission: . HTN (hypertension) . HLD (hyperlipidemia)  Active Problems:   HTN (hypertension)   Diabetes ([place])   HLD (hyperlipidemia)  Sepsis ([place])   Open wound of left foot   Osteomyelitis ([facility])   Leukocytosis   Fever   Anemia  58 yo male with history of HTN, DMII insulin dependant, peripheral neuropathy and open wound of left foot from prior TMA admitted this AM with anemia and sepsis likely from left foot osteomyelitis.  Sepsis - meeting criteria with fever, leukocytosis, tachycardia, source is left foot osteomyelitis - admit inpatient to stepdown - blood and UCx have been obtained - trend lactate - was given 1 liter IVF bolus due to CHF - maintenance IVF, bolus for rising lactate or hypotension - empiric Vancomycin/Zosyn  Left foot diabetic wound/osteomyelitis - as above - wound care consult - consider consult vascular surgery/podiatry in AM  HLD - Lipitor  DMII - resume home Novolin - Humalog SSI - diabetic diet  PVD - plavix  HTN - continue home Norvasc, hold Losartan  Peripheral neuropathy - gabapentin  DVT prophylaxis: Lovenox   Code Status: FULL   Time spent: 32 minutes   Mir Marry Guan MD [facility] Pager [phone]  If 7PM-7AM, please contact night-coverage [email or web address] Password [facility]  09/30/2018, 2:36 AM

## 2018-09-30 NOTE — Progress Notes (Addendum)
Daniel Moon is a 58 y.o. male with medical history significant for DM, peripheral neuropathy, nonhealing left transmetatarsal amputation wound and limb threatening ischemia being admitted with sepsis likely due to osteomyelitis. The patient is a poor historian but tells me he started having fevers today at home. He says he was feeling fine until today when he says his family heard him wake up yelling in his sleep. He was found to have a fever and his family called EMS. He last had balloon angioplasty in July and says he was given antibiotics after that procedure but didn't take them since they made him feel sick. Since that time, he has not seen a physician.   ED Course: He was found to be febrile, have a leukocytosis and XR of foot shows evidence of osteomyelitis. He was given IVF but gently due to his history of CHF, and his blood pressure is stable. He was given empiric vancomycin and zosyn.  09/30/18: Patient was seen and examined at his bedside this morning.  His left stump pain is well controlled.  He is on IV antibiotics and receiving 1 unit of PRBC.  Vascular surgery paged for consultation, Dr. Donzetta Matters, will see in consultation.  Patient admitted not taking his medications as prescribed.  Was on Plavix but cannot remember the last time he took it.  Working diagnosis: Sepsis secondary to left transmetatarsal wound osteomyelitis, medical noncompliance, peripheral artery disease on Plavix, he cannot remember the last time he took it, microcytic anemia with presenting hemoglobin 6.1 MCV 68 with baseline of 12, paroxysmal A. fib not on oral anticoagulation, resolving AKI on CKD 2, history of left ventricular thrombus in 2016, seen at [facility], history of IV drug use heroin in 2016, heart failure reduced EF 40% in 2016, history of hepatitis C.  Please refer to H&P dictated by Dr. Earlie Server on 09/30/2018 for further details of the assessment and plan.

## 2018-09-30 NOTE — ED Notes (Signed)
ED TO INPATIENT HANDOFF REPORT  ED Nurse Name and Phone #: Claiborne Billings   S Name/Age/Gender Daniel Moon 58 y.o. male Room/Bed: RESB/RESB  Code Status   Code Status: Full Code  Home/SNF/Other Home Patient oriented to: self, place, time and situation Is this baseline? Yes   Triage Complete: Triage complete  Chief Complaint infection in left foot  Triage Note Pt arrives via EMS from home with c/o left foot pain. 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 2 months ago had further amputation. Pt suppose to be taking antibiotics and not taking. 2 peripheral stents placed. Increased pain and edema in the left foot. Weeping. +fever. 104 temporal, Hr 112, BP 122/80, ET 30, resp 22, cbg 131. Unable to gain IV access.    Allergies Allergies  Allergen Reactions  . Amoxicillin-Pot Clavulanate Nausea And Vomiting    Level of Care/Admitting Diagnosis ED Disposition    ED Disposition Condition Comment   Admit  Hospital Area: [facility] [[id number]]  Level of Care: Stepdown [14]  Admit to SDU based on following criteria: Severe physiological/psychological symptoms:  Any diagnosis requiring assessment & intervention at least every 4 hours on an ongoing basis to obtain desired patient outcomes including stability and rehabilitation  Covid Evaluation: Asymptomatic Screening Protocol (No Symptoms)  [facility]: [id number]  Admitting Physician: Hollice Gong, M[facility] [id number]  Attending Physician: [facility], M[facility] [id number]  Estimated length of stay: 3 - 4 days  Certification:: I certify this patient will need inpatient services for at least 2 midnights  PT Class (Do Not Modify): Inpatient [101]  PT Acc Code (Do Not Modify): Private [1]       B Medical/Surgery History Past Medical History:  Diagnosis Date  . Diabetes mellitus without complication ([place])   . Heart attack ([place])   . Hepatitis C   . HLD  (hyperlipidemia)   . HTN (hypertension)   . Peripheral vascular disease ([place])   . Systolic CHF [facility])    Past Surgical History:  Procedure Laterality Date  . ABSCESS DRAINAGE       A IV Location/Drains/Wounds Patient Lines/Drains/Airways Status   Active Line/Drains/Airways    Name:   Placement date:   Placement time:   Site:   Days:   Peripheral IV 09/30/18 Left Forearm   09/30/18    0129    Forearm   less than 1   Peripheral IV 09/30/18 Right Forearm   09/30/18    0209    Forearm   less than 1          Intake/Output Last 24 hours No intake or output data in the 24 hours ending 09/30/18 0331  Labs/Imaging Results for orders placed or performed during the hospital encounter of 09/30/18 (from the past 48 hour(s))  CBG monitoring, ED     Status: Abnormal   Collection Time: 09/30/18 12:35 AM  Result Value Ref Range   Glucose-Capillary 121 (H) 70 - 99 mg/dL  Comprehensive metabolic panel     Status: Abnormal   Collection Time: 09/30/18 12:54 AM  Result Value Ref Range   Sodium 132 (L) 135 - 145 mmol/L   Potassium 4.6 3.5 - 5.1 mmol/L   Chloride 103 98 - 111 mmol/L   CO2 22 22 - 32 mmol/L   Glucose, Bld 111 (H) 70 - 99 mg/dL   BUN 24 (H) 6 - 20 mg/dL   Creatinine, Ser 1.42 (H) 0.61 - 1.24 mg/dL   Calcium  7.8 (L) 8.9 - 10.3 mg/dL   Total Protein 8.7 (H) 6.5 - 8.1 g/dL   Albumin 1.9 (L) 3.5 - 5.0 g/dL   AST 12 (L) 15 - 41 U/L   ALT 8 0 - 44 U/L   Alkaline Phosphatase 107 38 - 126 U/L   Total Bilirubin 1.1 0.3 - 1.2 mg/dL   GFR calc non Af Amer 54 (L) >60 mL/min   GFR calc Af Amer >60 >60 mL/min   Anion gap 7 5 - 15    Comment: Performed at [facility], [place] [address] [place], [place] [address]  Lactic acid, plasma     Status: None   Collection Time: 09/30/18 12:54 AM  Result Value Ref Range   Lactic Acid, Venous 1.4 0.5 - 1.9 mmol/L    Comment: Performed at [facility], [place] [address], [place], [place] [address]  CBC with  Differential     Status: Abnormal   Collection Time: 09/30/18 12:54 AM  Result Value Ref Range   WBC 25.0 (H) 4.0 - 10.5 K/uL   RBC 3.20 (L) 4.22 - 5.81 MIL/uL   Hemoglobin 6.9 (LL) 13.0 - 17.0 g/dL    Comment: Reticulocyte Hemoglobin testing may be clinically indicated, consider ordering this additional test [id number] THIS CRITICAL RESULT HAS VERIFIED AND BEEN CALLED TO DOSTER,T BY KENNEDY JACKSON ON 09 13 2020 AT 0148, AND HAS BEEN READ BACK. CRITICAL RESULT VERIFIED    HCT 22.0 (L) 39.0 - 52.0 %   MCV 68.8 (L) 80.0 - 100.0 fL   MCH 21.6 (L) 26.0 - 34.0 pg   MCHC 31.4 30.0 - 36.0 g/dL   RDW 18.2 (H) 11.5 - 15.5 %   Platelets 399 150 - 400 K/uL   nRBC 0.0 0.0 - 0.2 %   Neutrophils Relative % 88 %   Neutro Abs 22.2 (H) 1.7 - 7.7 K/uL   Lymphocytes Relative 6 %   Lymphs Abs 1.4 0.7 - 4.0 K/uL   Monocytes Relative 5 %   Monocytes Absolute 1.1 (H) 0.1 - 1.0 K/uL   Eosinophils Relative 0 %   Eosinophils Absolute 0.0 0.0 - 0.5 K/uL   Basophils Relative 0 %   Basophils Absolute 0.1 0.0 - 0.1 K/uL   Immature Granulocytes 1 %   Abs Immature Granulocytes 0.20 (H) 0.00 - 0.07 K/uL    Comment: Performed at [facility], [place] [address] [place], [place] [address]  Protime-INR     Status: Abnormal   Collection Time: 09/30/18 12:54 AM  Result Value Ref Range   Prothrombin Time 16.7 (H) 11.4 - 15.2 seconds   INR 1.4 (H) 0.8 - 1.2    Comment: (NOTE) INR goal varies based on device and disease states. Performed at [facility], [place] [address] [place], [place] [address]   SARS Coronavirus 2 [facility] order, Performed in [facility] hospital lab) Nasopharyngeal Nasopharyngeal Swab     Status: None   Collection Time: 09/30/18  1:40 AM   Specimen: Nasopharyngeal Swab  Result Value Ref Range   SARS Coronavirus 2 NEGATIVE NEGATIVE    Comment: (NOTE) If result is NEGATIVE SARS-CoV-2 target nucleic acids are NOT DETECTED. The SARS-CoV-2 RNA is generally  detectable in upper and lower  respiratory specimens during the acute phase of infection. The lowest  concentration of SARS-CoV-2 viral copies this assay can detect is 250  copies / mL. A negative result does not preclude SARS-CoV-2 infection  and should not be  used as the sole basis for treatment or other  patient management decisions.  A negative result may occur with  improper specimen collection / handling, submission of specimen other  than nasopharyngeal swab, presence of viral mutation(s) within the  areas targeted by this assay, and inadequate number of viral copies  (<250 copies / mL). A negative result must be combined with clinical  observations, patient history, and epidemiological information. If result is POSITIVE SARS-CoV-2 target nucleic acids are DETECTED. The SARS-CoV-2 RNA is generally detectable in upper and lower  respiratory specimens dur ing the acute phase of infection.  Positive  results are indicative of active infection with SARS-CoV-2.  Clinical  correlation with patient history and other diagnostic information is  necessary to determine patient infection status.  Positive results do  not rule out bacterial infection or co-infection with other viruses. If result is PRESUMPTIVE POSTIVE SARS-CoV-2 nucleic acids MAY BE PRESENT.   A presumptive positive result was obtained on the submitted specimen  and confirmed on repeat testing.  While 2019 novel coronavirus  (SARS-CoV-2) nucleic acids may be present in the submitted sample  additional confirmatory testing may be necessary for epidemiological  and / or clinical management purposes  to differentiate between  SARS-CoV-2 and other Sarbecovirus currently known to infect humans.  If clinically indicated additional testing with an alternate test  methodology [phone]) is advised. The SARS-CoV-2 RNA is generally  detectable in upper and lower respiratory sp ecimens during the acute  phase of infection. The  expected result is Negative. Fact Sheet for Patients:  StrictlyIdeas.no Fact Sheet for Healthcare Providers: BankingDealers.co.za This test is not yet approved or cleared by the [place] FDA and has been authorized for detection and/or diagnosis of SARS-CoV-2 by FDA under an Emergency Use Authorization (EUA).  This EUA will remain in effect (meaning this test can be used) for the duration of the COVID-19 declaration under Section 564(b)(1) of the Act, 21 U.S.C. section 360bbb-3(b)(1), unless the authorization is terminated or revoked sooner. Performed at [facility], [place] [address] [place], [place] [address]   Prepare RBC     Status: None   Collection Time: 09/30/18  2:13 AM  Result Value Ref Range   Order Confirmation      ORDER PROCESSED BY BLOOD BANK Performed at [place] [address], [place], [place] [address]   Type and screen [place]     Status: None (Preliminary result)   Collection Time: 09/30/18  2:13 AM  Result Value Ref Range   ABO/RH(D) B NEG    Antibody Screen NEG    Sample Expiration 10/03/2018,2359    Unit Number [id number]    Blood Component Type RED CELLS,LR    Unit division 00    Status of Unit ALLOCATED    Transfusion Status OK TO TRANSFUSE    Crossmatch Result      Compatible Performed at [facility], [place] [address], [place], [place] [address]   ABO/Rh     Status: None   Collection Time: 09/30/18  3:09 AM  Result Value Ref Range   ABO/RH(D)      B NEG Performed at [place] [address], [place], [place] [address]    Dg Chest Port 1 View  Result Date: 09/30/2018 CLINICAL DATA:  Suspected sepsis. EXAM: PORTABLE CHEST 1 VIEW COMPARISON:  Radiograph 07/29/2018, 02/22/2018 FINDINGS: Improved aeration from prior exams. Mild cardiomegaly with unchanged mediastinal contours. No acute  airspace disease. No pulmonary edema, pleural fluid, or pneumothorax. No acute osseous abnormalities. IMPRESSION: Mild cardiomegaly without acute abnormality. Electronically Signed   By: Keith Rake M.D.   On: 09/30/2018 01:28   Dg Foot Complete Left  Result Date: 09/30/2018 CLINICAL DATA:  Left foot pain. History of partial amputation. EXAM: LEFT FOOT - COMPLETE 3+ VIEW COMPARISON:  Radiograph 07/16/2018 FINDINGS: Prior transmetatarsal amputation. Soft tissue defect about the plantar lateral aspect of the foot. Osseous resorption with bony destructive change involving the in situ second through fifth metatarsals, progressed from prior exam. Interval bony destruction involving the cuboid with fragmentation laterally. Nonspecific decreased density involving the anterior cortex of the calcaneus. Generalized soft tissue edema. 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 Soft tissue defect overlies the plantar lateral aspect of the foot. Generalized soft tissue edema. Electronically Signed   By: Keith Rake M.D.   On: 09/30/2018 01:31    Pending Labs Unresulted Labs (From admission, onward)    Start     Ordered   10/07/18 0500  Creatinine, serum  (enoxaparin (LOVENOX)    CrCl >/= 30 ml/min)  Weekly,   R    Comments: while on enoxaparin therapy    09/30/18 0234   09/30/18 [id number]  Basic metabolic panel  Tomorrow morning,   R     09/30/18 0234   09/30/18 0500  CBC  Tomorrow morning,   R     09/30/18 0234   09/30/18 0232  Hemoglobin A1c  Once,   STAT    Comments: To assess prior glycemic control    09/30/18 0234   09/30/18 0232  CBC  (enoxaparin (LOVENOX)    CrCl >/= 30 ml/min)  Once,   STAT    Comments: Baseline for enoxaparin therapy IF NOT ALREADY DRAWN.  Notify MD if PLT < 100 K.    09/30/18 0234   09/30/18 0232  Creatinine, serum  (enoxaparin (LOVENOX)    CrCl >/= 30 ml/min)  Once,   STAT     Comments: Baseline for enoxaparin therapy IF NOT ALREADY DRAWN.    09/30/18 0234   09/30/18 0051  Lactic acid, plasma  Now then every 2 hours,   STAT     09/30/18 0051   09/30/18 0051  Culture, blood (Routine x 2)  BLOOD CULTURE X 2,   STAT     09/30/18 0051   09/30/18 0051  Urinalysis, Routine w reflex microscopic  ONCE - STAT,   STAT     09/30/18 0051          Vitals/Pain Today's Vitals   09/30/18 0230 09/30/18 0300 09/30/18 0324 09/30/18 0330  BP: 102/68 100/71    Pulse: 98 (!) 101  95  Resp: 20 14  18   Temp:    98.7 F (37.1 C)  TempSrc:      SpO2: 98% 96%  95%  Weight:      Height:      PainSc:   Asleep     Isolation Precautions No active isolations  Medications Medications  vancomycin (VANCOCIN) 1,750 mg in sodium chloride 0.9 % 500 mL IVPB (1,750 mg Intravenous New Bag/Given 09/30/18 0205)  piperacillin-tazobactam (ZOSYN) IVPB 3.375 g (has no administration in time range)  amLODipine (NORVASC) tablet 10 mg (has no administration in time range)  atorvastatin (LIPITOR) tablet 40 mg (has no administration in time range)  carvedilol (COREG) tablet 12.5 mg (has no administration in time  range)  nicotine (NICODERM CQ - dosed in mg/24 hours) patch 21 mg (has no administration in time range)  insulin NPH Human (NOVOLIN N) injection 10 Units (has no administration in time range)  clopidogrel (PLAVIX) tablet 75 mg (has no administration in time range)  gabapentin (NEURONTIN) capsule 300 mg (has no administration in time range)  enoxaparin (LOVENOX) injection 40 mg (has no administration in time range)  0.9 %  sodium chloride infusion (has no administration in time range)  acetaminophen (TYLENOL) tablet 650 mg (has no administration in time range)    Or  acetaminophen (TYLENOL) suppository 650 mg (has no administration in time range)  ondansetron (ZOFRAN) tablet 4 mg (has no administration in time range)    Or  ondansetron (ZOFRAN) injection 4 mg (has no administration  in time range)  insulin aspart (novoLOG) injection 0-15 Units (has no administration in time range)  oxyCODONE-acetaminophen (PERCOCET/ROXICET) 5-325 MG per tablet 1 tablet (has no administration in time range)  acetaminophen (TYLENOL) tablet 650 mg (650 mg Oral Given 09/30/18 0143)  piperacillin-tazobactam (ZOSYN) IVPB 3.375 g (0 g Intravenous Stopped 09/30/18 0203)  fentaNYL (SUBLIMAZE) injection 50 mcg (50 mcg Intravenous Given 09/30/18 0143)  0.9 %  sodium chloride infusion (10 mL/hr Intravenous New Bag/Given 09/30/18 0209)    Mobility walks with device Low fall risk   Focused Assessments wound to left foot   R Recommendations: See Admitting Provider Note  Report given to:   Additional Notes: lives at home with his mother, hx of amputations, peripheral stents

## 2018-09-30 NOTE — Progress Notes (Signed)
Unable to obtain labs this am. Lab tech unsuccessful and pt and his girlfriend would not allow lab tech to try again. Pt's girlfriend Varney Biles states patient needs a PICC. MD to assess in am. Ellamae Sia

## 2018-09-30 NOTE — Progress Notes (Signed)
A consult was received from an ED physician for vancomycin per pharmacy dosing.  The patient's profile has been reviewed for ht/wt/allergies/indication/available labs.   A one time order has been placed for Vancomycin 1750mg  iv x1.  Further antibiotics/pharmacy consults should be ordered by admitting physician if indicated.                       Thank you, Nani Skillern Crowford 09/30/2018  1:41 AM

## 2018-09-30 NOTE — ED Triage Notes (Signed)
Pt arrives via EMS from home with c/o left foot pain. 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 2 months ago had further amputation. Pt suppose to be taking antibiotics and not taking. 2 peripheral stents placed. Increased pain and edema in the left foot. Weeping. +fever. 104 temporal, Hr 112, BP 122/80, ET 30, resp 22, cbg 131. Unable to gain IV access.

## 2018-09-30 NOTE — Progress Notes (Signed)
Pharmacy Antibiotic Note  Daniel Moon is a 58 y.o. male admitted on 09/30/2018 with Osteomyelitis.  Pharmacy has been consulted for Vancomycin dosing.  Plan: Vancomycin 1750mg  iv x1, then  Vancomycin 1750 mg IV Q 24 hrs. Goal AUC 400-550. Expected AUC: 528 SCr used: 1.42   Height: 6\' 3"  (190.5 cm) Weight: 166 lb 10.7 oz (75.6 kg) IBW/kg (Calculated) : 84.5  Temp (24hrs), Avg:99.8 F (37.7 C), Min:98.3 F (36.8 C), Max:103.5 F (39.7 C)  Recent Labs  Lab 09/30/18 0054  WBC 25.0*  CREATININE 1.42*  LATICACIDVEN 1.4    Estimated Creatinine Clearance: 61.4 mL/min (A) (by C-G formula based on SCr of 1.42 mg/dL (H)).    Allergies  Allergen Reactions  . Amoxicillin-Pot Clavulanate Nausea And Vomiting    Antimicrobials this admission: Vancomycin 09/30/2018 >> Zosyn 09/30/2018 >>   Dose adjustments this admission: -  Microbiology results: -  Thank you for allowing pharmacy to be a part of this patient's care.  Nani Skillern Crowford 09/30/2018 5:23 AM

## 2018-10-01 ENCOUNTER — Inpatient Hospital Stay (HOSPITAL_COMMUNITY): Payer: Medicaid Other

## 2018-10-01 ENCOUNTER — Encounter (HOSPITAL_COMMUNITY): Payer: Medicaid Other

## 2018-10-01 DIAGNOSIS — Z89412 Acquired absence of left great toe: Secondary | ICD-10-CM

## 2018-10-01 DIAGNOSIS — B965 Pseudomonas (aeruginosa) (mallei) (pseudomallei) as the cause of diseases classified elsewhere: Secondary | ICD-10-CM

## 2018-10-01 DIAGNOSIS — G629 Polyneuropathy, unspecified: Secondary | ICD-10-CM

## 2018-10-01 DIAGNOSIS — R634 Abnormal weight loss: Secondary | ICD-10-CM | POA: Diagnosis present

## 2018-10-01 DIAGNOSIS — K089 Disorder of teeth and supporting structures, unspecified: Secondary | ICD-10-CM

## 2018-10-01 DIAGNOSIS — Z881 Allergy status to other antibiotic agents status: Secondary | ICD-10-CM

## 2018-10-01 DIAGNOSIS — E1169 Type 2 diabetes mellitus with other specified complication: Secondary | ICD-10-CM

## 2018-10-01 DIAGNOSIS — Z89432 Acquired absence of left foot: Secondary | ICD-10-CM

## 2018-10-01 DIAGNOSIS — F17210 Nicotine dependence, cigarettes, uncomplicated: Secondary | ICD-10-CM | POA: Diagnosis present

## 2018-10-01 DIAGNOSIS — R7881 Bacteremia: Secondary | ICD-10-CM

## 2018-10-01 DIAGNOSIS — I340 Nonrheumatic mitral (valve) insufficiency: Secondary | ICD-10-CM

## 2018-10-01 DIAGNOSIS — F17200 Nicotine dependence, unspecified, uncomplicated: Secondary | ICD-10-CM

## 2018-10-01 DIAGNOSIS — B961 Klebsiella pneumoniae [K. pneumoniae] as the cause of diseases classified elsewhere: Secondary | ICD-10-CM

## 2018-10-01 DIAGNOSIS — I480 Paroxysmal atrial fibrillation: Secondary | ICD-10-CM | POA: Diagnosis present

## 2018-10-01 DIAGNOSIS — E1151 Type 2 diabetes mellitus with diabetic peripheral angiopathy without gangrene: Secondary | ICD-10-CM

## 2018-10-01 DIAGNOSIS — N183 Chronic kidney disease, stage 3 unspecified: Secondary | ICD-10-CM | POA: Diagnosis present

## 2018-10-01 DIAGNOSIS — L8990 Pressure ulcer of unspecified site, unspecified stage: Secondary | ICD-10-CM | POA: Diagnosis present

## 2018-10-01 DIAGNOSIS — I361 Nonrheumatic tricuspid (valve) insufficiency: Secondary | ICD-10-CM

## 2018-10-01 DIAGNOSIS — I739 Peripheral vascular disease, unspecified: Secondary | ICD-10-CM | POA: Diagnosis present

## 2018-10-01 DIAGNOSIS — Z89422 Acquired absence of other left toe(s): Secondary | ICD-10-CM

## 2018-10-01 DIAGNOSIS — M86672 Other chronic osteomyelitis, left ankle and foot: Secondary | ICD-10-CM

## 2018-10-01 DIAGNOSIS — B9562 Methicillin resistant Staphylococcus aureus infection as the cause of diseases classified elsewhere: Secondary | ICD-10-CM

## 2018-10-01 LAB — CBC WITH DIFFERENTIAL/PLATELET
Abs Immature Granulocytes: 0.14 10*3/uL — ABNORMAL HIGH (ref 0.00–0.07)
Basophils Absolute: 0.1 10*3/uL (ref 0.0–0.1)
Basophils Relative: 0 %
Eosinophils Absolute: 0.3 10*3/uL (ref 0.0–0.5)
Eosinophils Relative: 2 %
HCT: 26.2 % — ABNORMAL LOW (ref 39.0–52.0)
Hemoglobin: 8.2 g/dL — ABNORMAL LOW (ref 13.0–17.0)
Immature Granulocytes: 1 %
Lymphocytes Relative: 16 %
Lymphs Abs: 2.5 10*3/uL (ref 0.7–4.0)
MCH: 22.2 pg — ABNORMAL LOW (ref 26.0–34.0)
MCHC: 31.3 g/dL (ref 30.0–36.0)
MCV: 70.8 fL — ABNORMAL LOW (ref 80.0–100.0)
Monocytes Absolute: 1 10*3/uL (ref 0.1–1.0)
Monocytes Relative: 7 %
Neutro Abs: 11.7 10*3/uL — ABNORMAL HIGH (ref 1.7–7.7)
Neutrophils Relative %: 74 %
Platelets: 386 10*3/uL (ref 150–400)
RBC: 3.7 MIL/uL — ABNORMAL LOW (ref 4.22–5.81)
RDW: 20.5 % — ABNORMAL HIGH (ref 11.5–15.5)
WBC: 15.8 10*3/uL — ABNORMAL HIGH (ref 4.0–10.5)
nRBC: 0 % (ref 0.0–0.2)

## 2018-10-01 LAB — BASIC METABOLIC PANEL
Anion gap: 11 (ref 5–15)
BUN: 27 mg/dL — ABNORMAL HIGH (ref 6–20)
CO2: 21 mmol/L — ABNORMAL LOW (ref 22–32)
Calcium: 7.6 mg/dL — ABNORMAL LOW (ref 8.9–10.3)
Chloride: 102 mmol/L (ref 98–111)
Creatinine, Ser: 1.78 mg/dL — ABNORMAL HIGH (ref 0.61–1.24)
GFR calc Af Amer: 48 mL/min — ABNORMAL LOW (ref >60)
GFR calc non Af Amer: 41 mL/min — ABNORMAL LOW (ref >60)
Glucose, Bld: 157 mg/dL — ABNORMAL HIGH (ref 70–99)
Potassium: 5.4 mmol/L — ABNORMAL HIGH (ref 3.5–5.1)
Sodium: 134 mmol/L — ABNORMAL LOW (ref 135–145)

## 2018-10-01 LAB — BPAM RBC
Blood Product Expiration Date: 202010122359
ISSUE DATE / TIME: 202009130439
Unit Type and Rh: 1700

## 2018-10-01 LAB — TYPE AND SCREEN
ABO/RH(D): B NEG
ABO/RH(D): B NEG
Antibody Screen: NEGATIVE
Antibody Screen: NEGATIVE
Unit division: 0

## 2018-10-01 LAB — GLUCOSE, CAPILLARY
Glucose-Capillary: 100 mg/dL — ABNORMAL HIGH (ref 70–99)
Glucose-Capillary: 112 mg/dL — ABNORMAL HIGH (ref 70–99)
Glucose-Capillary: 146 mg/dL — ABNORMAL HIGH (ref 70–99)
Glucose-Capillary: 137 mg/dL — ABNORMAL HIGH (ref 70–99)

## 2018-10-01 LAB — ECHOCARDIOGRAM COMPLETE
Height: 75 in
Weight: 2666.68 oz

## 2018-10-01 LAB — HEMOGLOBIN A1C
Hgb A1c MFr Bld: 8.4 % — ABNORMAL HIGH (ref 4.8–5.6)
Mean Plasma Glucose: 194 mg/dL

## 2018-10-01 MED ORDER — CALCIUM GLUCONATE-NACL 1-0.675 GM/50ML-% IV SOLN
1.0000 g | Freq: Once | INTRAVENOUS | Status: AC
  Administered 2018-10-01: 1000 mg via INTRAVENOUS
  Filled 2018-10-01: qty 50

## 2018-10-01 MED ORDER — SENNOSIDES-DOCUSATE SODIUM 8.6-50 MG PO TABS
2.0000 | ORAL_TABLET | Freq: Two times a day (BID) | ORAL | Status: DC
  Administered 2018-10-01 – 2018-10-13 (×21): 2 via ORAL
  Filled 2018-10-01 (×25): qty 2

## 2018-10-01 MED ORDER — HYDROMORPHONE HCL 1 MG/ML IJ SOLN
0.5000 mg | INTRAMUSCULAR | Status: DC | PRN
  Administered 2018-10-01 – 2018-10-02 (×2): 0.5 mg via INTRAVENOUS
  Filled 2018-10-01 (×2): qty 1

## 2018-10-01 MED ORDER — HEPARIN BOLUS VIA INFUSION
3000.0000 [IU] | Freq: Once | INTRAVENOUS | Status: AC
  Administered 2018-10-02: 3000 [IU] via INTRAVENOUS
  Filled 2018-10-01: qty 3000

## 2018-10-01 MED ORDER — VANCOMYCIN HCL 10 G IV SOLR
1250.0000 mg | INTRAVENOUS | Status: DC
  Administered 2018-10-01 – 2018-10-04 (×4): 1250 mg via INTRAVENOUS
  Filled 2018-10-01 (×5): qty 1250

## 2018-10-01 MED ORDER — FERROUS SULFATE 325 (65 FE) MG PO TABS
325.0000 mg | ORAL_TABLET | Freq: Every day | ORAL | Status: DC
  Administered 2018-10-01 – 2018-10-14 (×11): 325 mg via ORAL
  Filled 2018-10-01 (×13): qty 1

## 2018-10-01 MED ORDER — POLYETHYLENE GLYCOL 3350 17 G PO PACK
17.0000 g | PACK | Freq: Every day | ORAL | Status: DC
  Administered 2018-10-04 – 2018-10-11 (×6): 17 g via ORAL
  Filled 2018-10-01 (×10): qty 1

## 2018-10-01 MED ORDER — HEPARIN (PORCINE) 25000 UT/250ML-% IV SOLN
1050.0000 [IU]/h | INTRAVENOUS | Status: DC
  Administered 2018-10-02: 900 [IU]/h via INTRAVENOUS
  Filled 2018-10-01 (×2): qty 250

## 2018-10-01 MED ORDER — CLOPIDOGREL BISULFATE 75 MG PO TABS
75.0000 mg | ORAL_TABLET | Freq: Every day | ORAL | Status: DC
  Administered 2018-10-01: 75 mg via ORAL
  Filled 2018-10-01: qty 1

## 2018-10-01 MED ORDER — MORPHINE SULFATE (PF) 2 MG/ML IV SOLN
1.0000 mg | INTRAVENOUS | Status: DC | PRN
  Administered 2018-10-01 (×2): 1 mg via INTRAVENOUS
  Filled 2018-10-01 (×2): qty 1

## 2018-10-01 MED ORDER — SODIUM BICARBONATE 650 MG PO TABS
650.0000 mg | ORAL_TABLET | Freq: Two times a day (BID) | ORAL | Status: AC
  Administered 2018-10-01 – 2018-10-02 (×3): 650 mg via ORAL
  Filled 2018-10-01 (×4): qty 1

## 2018-10-01 MED ORDER — OXYCODONE-ACETAMINOPHEN 5-325 MG PO TABS
1.0000 | ORAL_TABLET | Freq: Four times a day (QID) | ORAL | Status: DC | PRN
  Administered 2018-10-01 (×3): 2 via ORAL
  Filled 2018-10-01 (×4): qty 2

## 2018-10-01 NOTE — Progress Notes (Signed)
At [place], secretary passed the message that pt was having chest pain. Upon assessment, pt was c/o SOB and pain to his right, medial, and left upper chest. VS remained stable: BP 121/86, spO2 100% 2L [place], HR 80. Placed pt on 2L [place], and performed an EKG. MD and charge RN notified. Will notify MD if pt experiences more chest pain. Pt is resting in bed quietly and states chest pain has resolved; will continue to monitor and assess pt.

## 2018-10-01 NOTE — H&P (View-Only) (Signed)
   Therapeutic heparin started LV thrombus noted on echo today. Can stop heparin on transfer to OR.   Brandon C. Donzetta Matters, MD [facility] [place] Office: [phone] Pager: [phone]

## 2018-10-01 NOTE — Progress Notes (Signed)
   Patient is amenable to left lower extremity amputation at this time.  I have discussed with Dr. [place] Crane he will be transferred to [facility] we will plan for left low knee amputation tomorrow late morning early afternoon.  He needs to be n.p.o. past midnight.  Nakiyah Beverley C. Donzetta Matters, MD [facility] [place] Office: [phone] Pager: [phone]

## 2018-10-01 NOTE — Progress Notes (Signed)
   Therapeutic heparin started LV thrombus noted on echo today. Can stop heparin on transfer to OR.   Brandon C. Donzetta Matters, MD [facility] [place] Office: [phone] Pager: [phone]

## 2018-10-01 NOTE — Progress Notes (Addendum)
Patient picked up by [facility], en route to [facility]. Patient's continuous fluids saline locked for transport. Charleston Ropes, RN made aware of the above. Belongings sent alongside patient.

## 2018-10-01 NOTE — Consult Note (Signed)
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  Cultures obtained over the last 9 months at the wound center grown MRSA, Klebsiella and Pseudomonas all to current antibiotics.  Repeat blood cultures and TTE are pending.  Plan: 1. Continue current antibiotics pending repeat blood cultures, TTE and left BKA  Principal Problem:   MRSA bacteremia Active Problems:   Sepsis ([place])   Open wound of left foot   Osteomyelitis (HCC)   S/P transmetatarsal amputation of foot, left (HCC)   Unintentional weight loss   HTN (hypertension)   Diabetes ([place])   HLD (hyperlipidemia)   COPD (chronic obstructive pulmonary disease) (HCC)   Microcytic anemia   PAD (peripheral artery disease) (HCC)   Peripheral neuropathy   Cigarette smoker   Pressure injury of skin   Chronic renal insufficiency, stage 3 (moderate) (HCC)   Paroxysmal atrial fibrillation (HCC)   Poor dentition   Scheduled Meds: . amLODipine  10 mg Oral Daily  . atorvastatin  40 mg Oral QHS  . carvedilol  12.5 mg Oral BID  . Chlorhexidine Gluconate Cloth  6 each Topical Daily  . clopidogrel  75 mg Oral Q breakfast  . enoxaparin (LOVENOX) injection  40 mg Subcutaneous Q24H  . ferrous sulfate  325 mg Oral Q breakfast  . gabapentin  300 mg Oral QHS  . insulin aspart  0-15 Units Subcutaneous TID WC  . insulin NPH Human  10 Units Subcutaneous BID AC & HS  . nicotine  21 mg Transdermal Daily  . polyethylene glycol  17 g Oral Daily  . senna-docusate  2 tablet Oral BID  . sodium bicarbonate  650 mg Oral BID   Continuous Infusions: . sodium chloride 50 mL/hr at 10/01/18 1600  . piperacillin-tazobactam (ZOSYN)  IV 12.5 mL/hr at 10/01/18 1600  . vancomycin     PRN Meds:.acetaminophen  **OR** acetaminophen, HYDROmorphone (DILAUDID) injection, ondansetron **OR** ondansetron (ZOFRAN) IV, oxyCODONE-acetaminophen  HPI: Daniel Moon is a 58 y.o. male with diabetes and peripheral artery disease who underwent left transmetatarsal amputation in [place] in 2016.  He has been struggling with a chronic, nonhealing wound of his left foot.  He had been followed at the wound center in [facility].  However, he stopped going in July after an argument with his doctor about pain medication.  He says that he stopped taking care of himself.  He has had more problems with wound drainage and swelling of his left foot.  His appetite has been very poor and he estimates that he has lost about 50 pounds.  He was brought to the emergency department 2 days ago after his family found him yelling.  His temperature was 103 degrees.  X-ray of his left foot revealed osteomyelitis of the second through fifth metatarsal and cuboid.  He was started on broad empiric antibiotic therapy.  Both admission blood cultures are growing MRSA.  He is now afebrile.  He has agreed to the left BKA.  He tells me that he wants to get better so that he can take care of his 7 children and 25 grandchildren.   Review of Systems: Review of Systems  Constitutional: Positive for fever, malaise/fatigue and weight loss. Negative for chills and diaphoresis.  HENT: Negative for congestion and sore throat.   Respiratory: Negative for cough, sputum production and shortness of breath.   Cardiovascular: Negative for chest pain.  Gastrointestinal: Negative for abdominal pain, diarrhea, nausea and vomiting.  Genitourinary: Negative for dysuria.  Musculoskeletal: Negative for back pain and joint pain.  Skin: Negative for rash.  Neurological: Negative for headaches.    Past Medical History:  Diagnosis Date  . Diabetes mellitus without complication ([place])   . Heart attack ([place])   . Hepatitis C   . HLD (hyperlipidemia)   . HTN  (hypertension)   . Peripheral vascular disease ([place])   . Systolic CHF [facility])     Social History   Tobacco Use  . Smoking status: Heavy Tobacco Smoker  . Smokeless tobacco: Never Used  Substance Use Topics  . Alcohol use: Yes  . Drug use: Yes    Comment: Heroin    Family History  Problem Relation Age of Onset  . Cancer Father   . Hypertension Father   . Heart disease Father   . Diabetes Father   . Stroke Sister   . Stroke Brother    Allergies  Allergen Reactions  . Amoxicillin-Pot Clavulanate Nausea And Vomiting    Did it involve swelling of the face/tongue/throat, SOB, or low BP? No Did it involve sudden or severe rash/hives, skin peeling, or any reaction on the inside of your mouth or nose? No Did you need to seek medical attention at a hospital or doctor's office? No When did it last happen?6 months ago If all above answers are "NO", may proceed with cephalosporin use.    OBJECTIVE: Blood pressure 104/74, pulse 81, temperature 97.7 F (36.5 C), temperature source Oral, resp. rate 11, height 6\' 3"  (1.905 m), weight 75.6 kg, SpO2 100 %.  Physical Exam Constitutional:      Comments: He is pleasant and in good spirits, sitting up in bed eating dinner.  Cardiovascular:     Rate and Rhythm: Normal rate and regular rhythm.     Heart sounds: No murmur.  Pulmonary:     Effort: Pulmonary effort is normal.     Breath sounds: Normal breath sounds.   Abdominal:     Palpations: Abdomen is soft.     Tenderness: There is no abdominal tenderness.       Lab Results Lab Results  Component Value Date   WBC 15.8 (H) 10/01/2018   HGB 8.2 (L) 10/01/2018   HCT 26.2 (L) 10/01/2018   MCV 70.8 (L) 10/01/2018   PLT 386 10/01/2018    Lab Results  Component Value Date   CREATININE 1.78 (H) 10/01/2018   BUN 27 (H) 10/01/2018   NA 134 (L) 10/01/2018   K 5.4 (H) 10/01/2018   CL 102 10/01/2018   CO2 21 (L) 10/01/2018    Lab Results  Component Value Date   ALT 8 09/30/2018    AST 12 (L) 09/30/2018   ALKPHOS 107 09/30/2018   BILITOT 1.1 09/30/2018     Microbiology: Recent Results (from the past 240 hour(s))  Culture, blood (Routine x 2)     Status: Abnormal (Preliminary result)   Collection Time: 09/30/18 12:54 AM   Specimen: BLOOD  Result Value Ref Range Status   Specimen Description   Final    BLOOD LEFT ANTECUBITAL Performed at [facility], [place] [address] [place], [place] [address]    Special Requests  Final    BOTTLES DRAWN AEROBIC AND ANAEROBIC Blood Culture results may not be optimal due to an inadequate volume of blood received in culture bottles Performed at [facility], [place] [address], [place], [place] [address]    Culture  Setup Time   Final    GRAM POSITIVE COCCI IN CLUSTERS IN BOTH AEROBIC AND ANAEROBIC BOTTLES CRITICAL RESULT CALLED TO, READ BACK BY AND VERIFIED WITH: Lavell Luster, PHARMD (WL) AT 2110 ON 09/30/18 BY C. JESSUP, MT. Performed at [facility], [place] [address], [place], [place] [address]    Culture STAPHYLOCOCCUS AUREUS (A)  Final   Report Status PENDING  Incomplete  Culture, blood (Routine x 2)     Status: Abnormal (Preliminary result)   Collection Time: 09/30/18  1:30 AM   Specimen: BLOOD RIGHT FOREARM  Result Value Ref Range Status   Specimen Description   Final    BLOOD RIGHT FOREARM Performed at [place] [address], [place], [place] [address]    Special Requests   Final    BOTTLES DRAWN AEROBIC AND ANAEROBIC Blood Culture adequate volume Performed at [place] [address] [place], [place] [address]    Culture  Setup Time   Final    GRAM POSITIVE COCCI IN CLUSTERS IN BOTH AEROBIC AND ANAEROBIC BOTTLES CRITICAL RESULT CALLED TO, READ BACK BY AND VERIFIED WITH: Lavell Luster, PHARMD (WL) AT 2110 ON 09/30/18 BY C. JESSUP, MT.    Culture (A)  Final    STAPHYLOCOCCUS AUREUS SUSCEPTIBILITIES TO FOLLOW Performed at [facility], [place] [address], [place], [place] [address]    Report Status PENDING  Incomplete  Blood Culture ID Panel (Reflexed)     Status: Abnormal   Collection Time: 09/30/18  1:30 AM  Result Value Ref Range Status   Enterococcus species NOT DETECTED NOT DETECTED Final   Listeria monocytogenes NOT DETECTED NOT DETECTED Final   Staphylococcus species DETECTED (A) NOT DETECTED Final    Comment: CRITICAL RESULT CALLED TO, READ BACK BY AND VERIFIED WITH: J. GRIMSLEY, PHARMD (WL) AT 2110 ON 09/30/18 BY C. JESSUP, MT.    Staphylococcus aureus (BCID) DETECTED (A) NOT DETECTED Final    Comment: Methicillin (oxacillin)-resistant Staphylococcus aureus (MRSA). MRSA is predictably resistant to beta-lactam antibiotics (except ceftaroline). Preferred therapy is vancomycin unless clinically contraindicated. Patient requires contact precautions if  hospitalized. CRITICAL RESULT CALLED TO, READ BACK BY AND VERIFIED WITH: J. GRIMSLEY, PHARMD (WL) AT 2110 ON 09/30/18 BY C. JESSUP, MT.    Methicillin resistance DETECTED (A) NOT DETECTED Final    Comment: CRITICAL RESULT CALLED TO, READ BACK BY AND VERIFIED WITH: J. GRIMSLEY, PHARMD (WL) AT 2110 ON 09/30/18 BY C. JESSUP, MT.    Streptococcus species NOT DETECTED NOT DETECTED Final   Streptococcus agalactiae NOT DETECTED NOT DETECTED Final   Streptococcus pneumoniae NOT DETECTED NOT DETECTED Final   Streptococcus pyogenes NOT DETECTED NOT DETECTED Final   Acinetobacter baumannii NOT DETECTED NOT DETECTED Final   Enterobacteriaceae species NOT DETECTED NOT DETECTED Final   Enterobacter cloacae complex NOT DETECTED NOT DETECTED Final   Escherichia coli NOT DETECTED NOT DETECTED Final   Klebsiella oxytoca NOT DETECTED NOT DETECTED Final   Klebsiella pneumoniae NOT DETECTED NOT DETECTED Final   Proteus species NOT DETECTED NOT DETECTED Final   Serratia marcescens NOT DETECTED NOT DETECTED Final   Haemophilus influenzae NOT DETECTED NOT DETECTED Final    Neisseria meningitidis NOT DETECTED NOT DETECTED Final  Pseudomonas aeruginosa NOT DETECTED NOT DETECTED Final   Candida albicans NOT DETECTED NOT DETECTED Final   Candida glabrata NOT DETECTED NOT DETECTED Final   Candida krusei NOT DETECTED NOT DETECTED Final   Candida parapsilosis NOT DETECTED NOT DETECTED Final   Candida tropicalis NOT DETECTED NOT DETECTED Final    Comment: Performed at [facility], [place] [address], [place], [place] [address]  SARS Coronavirus 2 [facility] order, Performed in [facility] hospital lab) Nasopharyngeal Nasopharyngeal Swab     Status: None   Collection Time: 09/30/18  1:40 AM   Specimen: Nasopharyngeal Swab  Result Value Ref Range Status   SARS Coronavirus 2 NEGATIVE NEGATIVE Final    Comment: (NOTE) If result is NEGATIVE SARS-CoV-2 target nucleic acids are NOT DETECTED. The SARS-CoV-2 RNA is generally detectable in upper and lower  respiratory specimens during the acute phase of infection. The lowest  concentration of SARS-CoV-2 viral copies this assay can detect is 250  copies / mL. A negative result does not preclude SARS-CoV-2 infection  and should not be used as the sole basis for treatment or other  patient management decisions.  A negative result may occur with  improper specimen collection / handling, submission of specimen other  than nasopharyngeal swab, presence of viral mutation(s) within the  areas targeted by this assay, and inadequate number of viral copies  (<250 copies / mL). A negative result must be combined with clinical  observations, patient history, and epidemiological information. If result is POSITIVE SARS-CoV-2 target nucleic acids are DETECTED. The SARS-CoV-2 RNA is generally detectable in upper and lower  respiratory specimens dur ing the acute phase of infection.  Positive  results are indicative of active infection with SARS-CoV-2.  Clinical  correlation with patient history and other diagnostic information is   necessary to determine patient infection status.  Positive results do  not rule out bacterial infection or co-infection with other viruses. If result is PRESUMPTIVE POSTIVE SARS-CoV-2 nucleic acids MAY BE PRESENT.   A presumptive positive result was obtained on the submitted specimen  and confirmed on repeat testing.  While 2019 novel coronavirus  (SARS-CoV-2) nucleic acids may be present in the submitted sample  additional confirmatory testing may be necessary for epidemiological  and / or clinical management purposes  to differentiate between  SARS-CoV-2 and other Sarbecovirus currently known to infect humans.  If clinically indicated additional testing with an alternate test  methodology [phone]) is advised. The SARS-CoV-2 RNA is generally  detectable in upper and lower respiratory sp ecimens during the acute  phase of infection. The expected result is Negative. Fact Sheet for Patients:  StrictlyIdeas.no Fact Sheet for Healthcare Providers: BankingDealers.co.za This test is not yet approved or cleared by the [place] FDA and has been authorized for detection and/or diagnosis of SARS-CoV-2 by FDA under an Emergency Use Authorization (EUA).  This EUA will remain in effect (meaning this test can be used) for the duration of the COVID-19 declaration under Section 564(b)(1) of the Act, 21 U.S.C. section 360bbb-3(b)(1), unless the authorization is terminated or revoked sooner. Performed at [facility], [place] [address] [place], [place] [address]   MRSA PCR Screening     Status: None   Collection Time: 09/30/18  4:01 AM   Specimen: Nasal Mucosa; Nasopharyngeal  Result Value Ref Range Status   MRSA by PCR NEGATIVE NEGATIVE Final    Comment:        The GeneXpert MRSA Assay (FDA approved for NASAL specimens  only), is one component of a comprehensive MRSA colonization surveillance program. It is not intended to  diagnose MRSA infection nor to guide or monitor treatment for MRSA infections. Performed at [facility], [place] [address], [place], [place] [address]     Michel Bickers, [place] [facility] [phone] pager   [phone] cell 10/01/2018, 5:49 PM

## 2018-10-01 NOTE — Progress Notes (Signed)
Report called to Netta Neat, RN at [facility] (6N, room 3). Report also called to [facility]. Per [facility], patient will be picked up in about 15 minutes.

## 2018-10-01 NOTE — Progress Notes (Signed)
Attempted to perform ABI and left lower extremity arterial duplex. The patient is refusing all vascular testing stating that "It won't do me any good. I'm just going to let them cut my leg." The patient is now willing to consider amputation of the left leg, and feels further testing would be a waste of time.  Dr. Donzetta Matters messaged regarding the above.  10/01/18 8:43 AM Daniel Moon RVT

## 2018-10-01 NOTE — Progress Notes (Signed)
ANTICOAGULATION CONSULT NOTE - Initial Consult  Pharmacy Consult for Heparin Indication: LV thrombus  Allergies  Allergen Reactions  . Amoxicillin-Pot Clavulanate Nausea And Vomiting    Did it involve swelling of the face/tongue/throat, SOB, or low BP? No Did it involve sudden or severe rash/hives, skin peeling, or any reaction on the inside of your mouth or nose? No Did you need to seek medical attention at a hospital or doctor's office? No When did it last happen?6 months ago If all above answers are "NO", may proceed with cephalosporin use.    Patient Measurements: Height: 6\' 3"  (190.5 cm) Weight: 166 lb 10.7 oz (75.6 kg) IBW/kg (Calculated) : 84.5   Vital Signs: Temp: 97.7 F (36.5 C) (09/14 1200) Temp Source: Oral (09/14 1200) BP: 109/62 (09/14 1800) Pulse Rate: 74 (09/14 1800)  Labs: Recent Labs    09/30/18 0054 10/01/18 0236  HGB 6.9* 8.2*  HCT 22.0* 26.2*  PLT 399 386  LABPROT 16.7*  --   INR 1.4*  --   CREATININE 1.42* 1.78*    Estimated Creatinine Clearance: 49 mL/min (A) (by C-G formula based on SCr of 1.78 mg/dL (H)).   Medical History: Past Medical History:  Diagnosis Date  . Diabetes mellitus without complication ([place])   . Heart attack ([place])   . Hepatitis C   . HLD (hyperlipidemia)   . HTN (hypertension)   . Peripheral vascular disease ([place])   . Systolic CHF [facility])     Assessment: 58 year old male to begin heparin for LV thrombus Planning OR tomorrow for left BKA  Goal of Therapy:  Heparin level 0.3-0.7 units/ml Monitor platelets by anticoagulation protocol: Yes   Plan:  Heparin 3000 units iv bolus x 1 Heparin drip at 900 units / hr Heparin level in 6 hours Heparin stop on call to OR tomorrow  Thank you Anette Guarneri, PharmD  Tad Moore 10/01/2018,7:30 PM

## 2018-10-01 NOTE — Progress Notes (Addendum)
PROGRESS NOTE  Daniel Moon [id number] DOB: 1961/01/10 DOA: [id number] PCP: Daniel Letters, MD  HPI/Recap of past 24 hours: Daniel Pew Warrenis a 58 y.o.malewith medical history significant forDM, peripheral neuropathy, nonhealing left transmetatarsal amputation wound and limb threatening ischemia being admitted with sepsis likely due to osteomyelitis. The patient is a poor historian but tells me he started having fevers today at home. He says he was feeling fine until today when he says his family heard him wake up yelling in his sleep. He was found to have a fever and his family called EMS. He last had balloon angioplasty in July and says he was given antibiotics after that procedure but didn't take them since they made him feel sick. Since that time, he has not seen a physician.  ED Course:He was found to be febrile, have a leukocytosis and XR of foot shows evidence of osteomyelitis. He was given IVF but gently due to his history of CHF, and his blood pressure is stable. He was given empiric vancomycin and zosyn.  09/30/18: Patient was seen and examined at his bedside this morning.  His left stump pain is well controlled.  He is on IV antibiotics and receiving 1 unit of PRBC.  Vascular surgery paged for consultation, Dr. Donzetta Matters, will see in consultation.  Patient admitted not taking his medications as prescribed.  Was on Plavix but cannot remember the last time he took it.  Working diagnosis: Sepsis secondary to left transmetatarsal wound osteomyelitis, medical noncompliance, peripheral artery disease on Plavix, he cannot remember the last time he took it, microcytic anemia with presenting hemoglobin 6.1 MCV 68 with baseline of 12, paroxysmal A. fib not on oral anticoagulation, resolving AKI on CKD 2, history of left ventricular thrombus in 2016, seen at [facility], history of IV drug use heroin in 2016, heart failure reduced EF 40% in 2016, history of hepatitis C.  10/01/18:  Patient seen and examined at his bedside this morning.  Reports significant pain in his left stump.  Declines amputation this morning.  Vascular surgery will follow-up.  2 out of 2 bottles positive for MRSA.  2D echo ordered and pending.   Assessment/Plan: Principal Problem:   MRSA bacteremia Active Problems:   HTN (hypertension)   Diabetes ([place])   HLD (hyperlipidemia)   Sepsis ([place])   Open wound of left foot   Osteomyelitis (H[facility])   Leukocytosis   Fever   Anemia   PAD (peripheral artery disease) (H[facility])   Peripheral neuropathy  Sepsis secondary to left lower extremity osteomyelitis, MRSA bacteremia Presented with leukocytosis, tachypnea, tachycardia and left lower extremity osteomyelitis Seen by vascular surgery, declined amputation Started on IV vancomycin and IV Zosyn empirically, continue Blood cultures drawn on 09/30/2018 +4 MRSA 2 out of 2 bottles 2D echo ordered and pending Repeat blood cultures on 10/02/2018 Hold off PICC line until blood cultures negative for at least 48 hours  Left lower extremity osteomyelitis Possible amputation by vascular surgery, will transfer to [facility] if patient is agreeable to surgery Management as stated above Optimize pain control Continue bowel regimen to avoid opiate-induced constipation  MRSA bacteremia Management as stated above Closely monitor vital signs, fever curve and WBC Responding well to IV antibiotics with WBC trending down from 25K to 15 K.  AKI on CKD 2 Baseline creatinine appears to be 1.4 with GFR greater than 60 Creatinine 1.78 with GFR 48 Continue gentle IV fluid hydration Monitor urine output Avoid nephrotoxins Start daily BMPs  Hyperkalemia Not on potassium  supplement Potassium 5.4 Ordered 1 g calcium gluconate prior to twelve-lead EKG Independently reviewed twelve-lead EKG done on 10/01/2018 showing sinus rhythm rate of 83 with nonspecific ST-T changes. BMP in a.m.  Mild non-anion gap metabolic acidosis likely  secondary to acute renal failure Chemistry bicarb 21 Started on p.o. sodium bicarbonate 650 mg twice daily x3 days  Iron deficiency anemia/anemia of chronic disease Presented with hemoglobin of 6.9 Transfuse 1 unit PRBC on 09/30/2018 Hemoglobin on 10/01/2018 is 8.2 MCV 70 Iron studies done on 09/30/2018 significant for iron deficiency Hold off IV iron supplement in the setting of active infective process Start p.o. ferrous sulfate 325 daily Monitor H&H  Peripheral vascular disease ABI ordered and pending Hold off Plavix due to possible surgery  Chronic systolic CHF Noncompliant with his medications Seen at [facility] TTE ordered and pending Continue strict I's and O's and daily weight Gentle IV fluid hydration normal saline at 50 cc/h in the setting of sepsis  Hyperglycemia, possibly stress-induced Obtain hemoglobin A1c Start sensitive insulin sliding scale  Hypovolemic hyponatremia Sodium 134 Euvolemic on exam Continue gentle IV fluid hydration Repeat BMP in the morning  Risks: Patient is at high risk for decompensation due to sepsis secondary to left lower extremity osteomyelitis complicated by MRSA bacteremia, multiple comorbidities and advanced age.  Patient will require at least 2 midnights for further evaluation and treatment of present condition.     Code Status: Full code  Family Communication: None at bedside  Disposition Plan: Undetermined; awaiting plan from vascular surgery.   Consultants:  Vascular surgery  Procedures:  None  Antimicrobials: IV vancomycin and IV Zosyn   DVT prophylaxis: Subcu Lovenox daily   Objective: Vitals:   10/01/18 0400 10/01/18 0500 10/01/18 0700 10/01/18 0800  BP: 119/77 137/78 134/88   Pulse: 81     Resp: 20 16 (!) 23   Temp: 97.8 F (36.6 C)   97.6 F (36.4 C)  TempSrc: Oral   Oral  SpO2: 100%     Weight:      Height:        Intake/Output Summary (Last 24 hours) at 10/01/2018 0857 Last data filed at  10/01/2018 [id number] Gross per 24 hour  Intake 2626.33 ml  Output 300 ml  Net 2326.33 ml   Filed Weights   09/30/18 0027 09/30/18 0330 09/30/18 0606  Weight: 81.6 kg 75.6 kg 75.6 kg    Exam:   General: 58 y.o. year-old male well developed well nourished in no acute distress.  Alert and oriented x3.  Cardiovascular: Regular rate and rhythm with no rubs or gallops.  No thyromegaly or JVD noted.    Respiratory: Clear to auscultation with no wheezes or rales. Good inspiratory effort.  Abdomen: Soft nontender nondistended with normal bowel sounds x4 quadrants.  Musculoskeletal: Trace lower extremity edema bilaterally.  Left lower extremity stump with ulcerative lesion.  Difficult to palpate dorsalis pedis pulses bilaterally.  Psychiatry: Mood is irritable.   Data Reviewed: CBC: Recent Labs  Lab 09/30/18 0054 10/01/18 0236  WBC 25.0* 15.8*  NEUTROABS 22.2* 11.7*  HGB 6.9* 8.2*  HCT 22.0* 26.2*  MCV 68.8* 70.8*  PLT 399 [id number]   Basic Metabolic Panel: Recent Labs  Lab 09/30/18 0054 10/01/18 0236  NA 132* 134*  K 4.6 5.4*  CL 103 102  CO2 22 21*  GLUCOSE 111* 157*  BUN 24* 27*  CREATININE 1.42* 1.78*  CALCIUM 7.8* 7.6*   GFR: Estimated Creatinine Clearance: 49 mL/min (A) (by C-G formula based on  SCr of 1.78 mg/dL (H)). Liver Function Tests: Recent Labs  Lab 09/30/18 0054  AST 12*  ALT 8  ALKPHOS 107  BILITOT 1.1  PROT 8.7*  ALBUMIN 1.9*   No results for input(s): LIPASE, AMYLASE in the last 168 hours. No results for input(s): AMMONIA in the last 168 hours. Coagulation Profile: Recent Labs  Lab 09/30/18 0054  INR 1.4*   Cardiac Enzymes: No results for input(s): CKTOTAL, CKMB, CKMBINDEX, TROPONINI in the last 168 hours. BNP (last 3 results) No results for input(s): PROBNP in the last 8760 hours. HbA1C: No results for input(s): HGBA1C in the last 72 hours. CBG: Recent Labs  Lab 09/30/18 0816 09/30/18 1226 09/30/18 1659 09/30/18 2121 10/01/18 0746    GLUCAP 144* 214* 187* 246* 100*   Lipid Profile: No results for input(s): CHOL, HDL, LDLCALC, TRIG, CHOLHDL, LDLDIRECT in the last 72 hours. Thyroid Function Tests: No results for input(s): TSH, T4TOTAL, FREET4, T3FREE, THYROIDAB in the last 72 hours. Anemia Panel: Recent Labs    09/30/18 0117  FERRITIN 164  TIBC 177*  IRON 12*   Urine analysis:    Component Value Date/Time   COLORURINE AMBER (A) 09/30/2018 0051   APPEARANCEUR CLOUDY (A) 09/30/2018 0051   LABSPEC 1.024 09/30/2018 0051   PHURINE 5.0 09/30/2018 0051   GLUCOSEU NEGATIVE 09/30/2018 0051   HGBUR NEGATIVE 09/30/2018 0051   BILIRUBINUR NEGATIVE 09/30/2018 0051   KETONESUR NEGATIVE 09/30/2018 0051   PROTEINUR 100 (A) 09/30/2018 0051   NITRITE NEGATIVE 09/30/2018 0051   LEUKOCYTESUR NEGATIVE 09/30/2018 0051   Sepsis Labs: @LABRCNTIP (procalcitonin:4,lacticidven:4)  ) Recent Results (from the past 240 hour(s))  Culture, blood (Routine x 2)     Status: Abnormal (Preliminary result)   Collection Time: 09/30/18 12:54 AM   Specimen: BLOOD  Result Value Ref Range Status   Specimen Description   Final    BLOOD LEFT ANTECUBITAL Performed at [facility], [place] [address] [place], [place] [address]    Special Requests   Final    BOTTLES DRAWN AEROBIC AND ANAEROBIC Blood Culture results may not be optimal due to an inadequate volume of blood received in culture bottles Performed at [place] [address] [place], [place] [address]    Culture  Setup Time   Final    GRAM POSITIVE COCCI IN CLUSTERS IN BOTH AEROBIC AND ANAEROBIC BOTTLES CRITICAL RESULT CALLED TO, READ BACK BY AND VERIFIED WITH: Lavell Luster, PHARMD (WL) AT 2110 ON 09/30/18 BY C. JESSUP, MT. Performed at [facility], [place] [address], [place], [place] [address]    Culture STAPHYLOCOCCUS AUREUS (A)  Final   Report Status PENDING  Incomplete  Culture, blood (Routine x 2)     Status: Abnormal (Preliminary  result)   Collection Time: 09/30/18  1:30 AM   Specimen: BLOOD RIGHT FOREARM  Result Value Ref Range Status   Specimen Description   Final    BLOOD RIGHT FOREARM Performed at [place] [address] [place], [place] [address]    Special Requests   Final    BOTTLES DRAWN AEROBIC AND ANAEROBIC Blood Culture adequate volume Performed at [place] [address], [place], [place] [address]    Culture  Setup Time   Final    GRAM POSITIVE COCCI IN CLUSTERS IN BOTH AEROBIC AND ANAEROBIC BOTTLES CRITICAL RESULT CALLED TO, READ BACK BY AND VERIFIED WITH: Lavell Luster, PHARMD (WL) AT 2110 ON 09/30/18 BY C. JESSUP, MT.  Culture (A)  Final    STAPHYLOCOCCUS AUREUS SUSCEPTIBILITIES TO FOLLOW Performed at [facility], [place] [address] [place], [place] [address]    Report Status PENDING  Incomplete  Blood Culture ID Panel (Reflexed)     Status: Abnormal   Collection Time: 09/30/18  1:30 AM  Result Value Ref Range Status   Enterococcus species NOT DETECTED NOT DETECTED Final   Listeria monocytogenes NOT DETECTED NOT DETECTED Final   Staphylococcus species DETECTED (A) NOT DETECTED Final    Comment: CRITICAL RESULT CALLED TO, READ BACK BY AND VERIFIED WITH: J. GRIMSLEY, PHARMD (WL) AT 2110 ON 09/30/18 BY C. JESSUP, MT.    Staphylococcus aureus (BCID) DETECTED (A) NOT DETECTED Final    Comment: Methicillin (oxacillin)-resistant Staphylococcus aureus (MRSA). MRSA is predictably resistant to beta-lactam antibiotics (except ceftaroline). Preferred therapy is vancomycin unless clinically contraindicated. Patient requires contact precautions if  hospitalized. CRITICAL RESULT CALLED TO, READ BACK BY AND VERIFIED WITH: J. GRIMSLEY, PHARMD (WL) AT 2110 ON 09/30/18 BY C. JESSUP, MT.    Methicillin resistance DETECTED (A) NOT DETECTED Final    Comment: CRITICAL RESULT CALLED TO, READ BACK BY AND VERIFIED WITH: J. GRIMSLEY, PHARMD (WL) AT 2110 ON 09/30/18  BY C. JESSUP, MT.    Streptococcus species NOT DETECTED NOT DETECTED Final   Streptococcus agalactiae NOT DETECTED NOT DETECTED Final   Streptococcus pneumoniae NOT DETECTED NOT DETECTED Final   Streptococcus pyogenes NOT DETECTED NOT DETECTED Final   Acinetobacter baumannii NOT DETECTED NOT DETECTED Final   Enterobacteriaceae species NOT DETECTED NOT DETECTED Final   Enterobacter cloacae complex NOT DETECTED NOT DETECTED Final   Escherichia coli NOT DETECTED NOT DETECTED Final   Klebsiella oxytoca NOT DETECTED NOT DETECTED Final   Klebsiella pneumoniae NOT DETECTED NOT DETECTED Final   Proteus species NOT DETECTED NOT DETECTED Final   Serratia marcescens NOT DETECTED NOT DETECTED Final   Haemophilus influenzae NOT DETECTED NOT DETECTED Final   Neisseria meningitidis NOT DETECTED NOT DETECTED Final   Pseudomonas aeruginosa NOT DETECTED NOT DETECTED Final   Candida albicans NOT DETECTED NOT DETECTED Final   Candida glabrata NOT DETECTED NOT DETECTED Final   Candida krusei NOT DETECTED NOT DETECTED Final   Candida parapsilosis NOT DETECTED NOT DETECTED Final   Candida tropicalis NOT DETECTED NOT DETECTED Final    Comment: Performed at [facility], [place]. [address], [place], [place] [address]  SARS Coronavirus 2 [facility] order, Performed in [facility] hospital lab) Nasopharyngeal Nasopharyngeal Swab     Status: None   Collection Time: 09/30/18  1:40 AM   Specimen: Nasopharyngeal Swab  Result Value Ref Range Status   SARS Coronavirus 2 NEGATIVE NEGATIVE Final    Comment: (NOTE) If result is NEGATIVE SARS-CoV-2 target nucleic acids are NOT DETECTED. The SARS-CoV-2 RNA is generally detectable in upper and lower  respiratory specimens during the acute phase of infection. The lowest  concentration of SARS-CoV-2 viral copies this assay can detect is 250  copies / mL. A negative result does not preclude SARS-CoV-2 infection  and should not be used as the sole basis for treatment or  other  patient management decisions.  A negative result may occur with  improper specimen collection / handling, submission of specimen other  than nasopharyngeal swab, presence of viral mutation(s) within the  areas targeted by this assay, and inadequate number of viral copies  (<250 copies / mL). A negative result must be combined with clinical  observations, patient history, and epidemiological information.  If result is POSITIVE SARS-CoV-2 target nucleic acids are DETECTED. The SARS-CoV-2 RNA is generally detectable in upper and lower  respiratory specimens dur ing the acute phase of infection.  Positive  results are indicative of active infection with SARS-CoV-2.  Clinical  correlation with patient history and other diagnostic information is  necessary to determine patient infection status.  Positive results do  not rule out bacterial infection or co-infection with other viruses. If result is PRESUMPTIVE POSTIVE SARS-CoV-2 nucleic acids MAY BE PRESENT.   A presumptive positive result was obtained on the submitted specimen  and confirmed on repeat testing.  While 2019 novel coronavirus  (SARS-CoV-2) nucleic acids may be present in the submitted sample  additional confirmatory testing may be necessary for epidemiological  and / or clinical management purposes  to differentiate between  SARS-CoV-2 and other Sarbecovirus currently known to infect humans.  If clinically indicated additional testing with an alternate test  methodology [phone]) is advised. The SARS-CoV-2 RNA is generally  detectable in upper and lower respiratory sp ecimens during the acute  phase of infection. The expected result is Negative. Fact Sheet for Patients:  StrictlyIdeas.no Fact Sheet for Healthcare Providers: BankingDealers.co.za This test is not yet approved or cleared by the [place] FDA and has been authorized for detection and/or diagnosis of  SARS-CoV-2 by FDA under an Emergency Use Authorization (EUA).  This EUA will remain in effect (meaning this test can be used) for the duration of the COVID-19 declaration under Section 564(b)(1) of the Act, 21 U.S.C. section 360bbb-3(b)(1), unless the authorization is terminated or revoked sooner. Performed at [facility], [place] [address] [place], [place] [address]   MRSA PCR Screening     Status: None   Collection Time: 09/30/18  4:01 AM   Specimen: Nasal Mucosa; Nasopharyngeal  Result Value Ref Range Status   MRSA by PCR NEGATIVE NEGATIVE Final    Comment:        The GeneXpert MRSA Assay (FDA approved for NASAL specimens only), is one component of a comprehensive MRSA colonization surveillance program. It is not intended to diagnose MRSA infection nor to guide or monitor treatment for MRSA infections. Performed at [facility], [place] [address] [place], [place] [address]       Studies: No results found.  Scheduled Meds:  amLODipine  10 mg Oral Daily   atorvastatin  40 mg Oral QHS   carvedilol  12.5 mg Oral BID   Chlorhexidine Gluconate Cloth  6 each Topical Daily   [START ON 10/02/2018] clopidogrel  75 mg Oral Daily   enoxaparin (LOVENOX) injection  40 mg Subcutaneous Q24H   ferrous sulfate  325 mg Oral Q breakfast   gabapentin  300 mg Oral QHS   insulin aspart  0-15 Units Subcutaneous TID WC   insulin NPH Human  10 Units Subcutaneous BID AC & HS   nicotine  21 mg Transdermal Daily   sodium bicarbonate  650 mg Oral BID    Continuous Infusions:  sodium chloride 50 mL/hr at 10/01/18 0751   piperacillin-tazobactam (ZOSYN)  IV 12.5 mL/hr at 10/01/18 0659   vancomycin Stopped (09/30/18 2344)     LOS: 1 day     Kayleen Memos, MD [facility] Pager [phone]  If 7PM-7AM, please contact night-coverage [email or web address] Password [facility] 10/01/2018, 8:57 AM

## 2018-10-01 NOTE — Progress Notes (Addendum)
mls/minPharmacy Antibiotic Note  Daniel Moon is a 58 y.o. male admitted on 09/30/2018 with Osteomyelitis.  Pharmacy has been consulted for Vancomycin dosing.  10/01/2018 Scr 1.78, CrCl ~ 1.78   Plan: Decrease vancomycin to 1250mg  IV q24h, AUC 509.9, Scr 1.78 Continue Zosyn 3.375g IV Q8H infused over 4hrs (per MD) Daily Scr Follow renal function and clinical course    Height: 6\' 3"  (190.5 cm) Weight: 166 lb 10.7 oz (75.6 kg) IBW/kg (Calculated) : 84.5  Temp (24hrs), Avg:98 F (36.7 C), Min:97.6 F (36.4 C), Max:98.7 F (37.1 C)  Recent Labs  Lab 09/30/18 0054 10/01/18 0236  WBC 25.0* 15.8*  CREATININE 1.42* 1.78*  LATICACIDVEN 1.4  --     Estimated Creatinine Clearance: 49 mL/min (A) (by C-G formula based on SCr of 1.78 mg/dL (H)).    Allergies  Allergen Reactions  . Amoxicillin-Pot Clavulanate Nausea And Vomiting    Did it involve swelling of the face/tongue/throat, SOB, or low BP? No Did it involve sudden or severe rash/hives, skin peeling, or any reaction on the inside of your mouth or nose? No Did you need to seek medical attention at a hospital or doctor's office? No When did it last happen?6 months ago If all above answers are "NO", may proceed with cephalosporin use.    Antimicrobials this admission: Vancomycin 10/01/2018 >> Zosyn 10/01/2018 >>   Dose adjustments this admission: 9/14 changed from vanc 1750mg  q24h to 1250mg  q24h  Microbiology results: -  Thank you for allowing pharmacy to be a part of this patient's care.  Dolly Rias RPh 10/01/2018, 10:38 AM Pager [phone]

## 2018-10-01 NOTE — Progress Notes (Signed)
Patient stated to nurse that he was willing to have the below-knee amputation on the left. Dr. Nevada Crane and Dr. Donzetta Matters made aware. Per Dr. Donzetta Matters, he will work on arranging transfer to [facility].  Patient updated and verbalized understanding.

## 2018-10-01 NOTE — Progress Notes (Signed)
PHARMACY - PHYSICIAN COMMUNICATION CRITICAL VALUE ALERT - BLOOD CULTURE IDENTIFICATION (BCID)  Daniel Moon is an 58 y.o. male who presented to [facility] on 09/30/2018 with a chief complaint of Foot pain.  Assessment:  Patient with 4/4 BCID result with MRSA. (include suspected source if known)  Name of physician (or Provider) Contacted: none  Current antibiotics: Vancomycin and Zosyn   Changes to prescribed antibiotics recommended:  Patient is on recommended antibiotics - No changes needed  Results for orders placed or performed during the hospital encounter of 09/30/18  Blood Culture ID Panel (Reflexed) (Collected: 09/30/2018  1:30 AM)  Result Value Ref Range   Enterococcus species NOT DETECTED NOT DETECTED   Listeria monocytogenes NOT DETECTED NOT DETECTED   Staphylococcus species DETECTED (A) NOT DETECTED   Staphylococcus aureus (BCID) DETECTED (A) NOT DETECTED   Methicillin resistance DETECTED (A) NOT DETECTED   Streptococcus species NOT DETECTED NOT DETECTED   Streptococcus agalactiae NOT DETECTED NOT DETECTED   Streptococcus pneumoniae NOT DETECTED NOT DETECTED   Streptococcus pyogenes NOT DETECTED NOT DETECTED   Acinetobacter baumannii NOT DETECTED NOT DETECTED   Enterobacteriaceae species NOT DETECTED NOT DETECTED   Enterobacter cloacae complex NOT DETECTED NOT DETECTED   Escherichia coli NOT DETECTED NOT DETECTED   Klebsiella oxytoca NOT DETECTED NOT DETECTED   Klebsiella pneumoniae NOT DETECTED NOT DETECTED   Proteus species NOT DETECTED NOT DETECTED   Serratia marcescens NOT DETECTED NOT DETECTED   Haemophilus influenzae NOT DETECTED NOT DETECTED   Neisseria meningitidis NOT DETECTED NOT DETECTED   Pseudomonas aeruginosa NOT DETECTED NOT DETECTED   Candida albicans NOT DETECTED NOT DETECTED   Candida glabrata NOT DETECTED NOT DETECTED   Candida krusei NOT DETECTED NOT DETECTED   Candida parapsilosis NOT DETECTED NOT DETECTED   Candida tropicalis NOT DETECTED  NOT DETECTED    Daniel Moon 10/01/2018  4:12 AM

## 2018-10-01 NOTE — Progress Notes (Addendum)
Patient had 1 episode of emesis (undigested food). PRN IV Zofran given. Nausea/emesis resolved. MD made aware. Will continue to monitor.

## 2018-10-01 NOTE — Progress Notes (Signed)
  Echocardiogram 2D Echocardiogram has been performed.  Randa Lynn Adely Facer 10/01/2018, 4:24 PM

## 2018-10-02 ENCOUNTER — Inpatient Hospital Stay (HOSPITAL_COMMUNITY): Payer: Medicaid Other | Admitting: Certified Registered Nurse Anesthetist

## 2018-10-02 ENCOUNTER — Encounter (HOSPITAL_COMMUNITY): Payer: Self-pay | Admitting: Certified Registered Nurse Anesthetist

## 2018-10-02 ENCOUNTER — Encounter (HOSPITAL_COMMUNITY)
Admission: EM | Disposition: A | Discharge status: Home Health | Payer: Self-pay | Source: Home / Self Care | Attending: Internal Medicine

## 2018-10-02 DIAGNOSIS — R768 Other specified abnormal immunological findings in serum: Secondary | ICD-10-CM

## 2018-10-02 DIAGNOSIS — R011 Cardiac murmur, unspecified: Secondary | ICD-10-CM

## 2018-10-02 DIAGNOSIS — I739 Peripheral vascular disease, unspecified: Secondary | ICD-10-CM

## 2018-10-02 DIAGNOSIS — I240 Acute coronary thrombosis not resulting in myocardial infarction: Secondary | ICD-10-CM

## 2018-10-02 DIAGNOSIS — T8781 Dehiscence of amputation stump: Secondary | ICD-10-CM

## 2018-10-02 DIAGNOSIS — I071 Rheumatic tricuspid insufficiency: Secondary | ICD-10-CM

## 2018-10-02 HISTORY — PX: AMPUTATION: SHX166

## 2018-10-02 LAB — GLUCOSE, CAPILLARY
Glucose-Capillary: 102 mg/dL — ABNORMAL HIGH (ref 70–99)
Glucose-Capillary: 109 mg/dL — ABNORMAL HIGH (ref 70–99)
Glucose-Capillary: 92 mg/dL (ref 70–99)
Glucose-Capillary: 99 mg/dL (ref 70–99)
Glucose-Capillary: 170 mg/dL — ABNORMAL HIGH (ref 70–99)

## 2018-10-02 LAB — ABO/RH: ABO/RH(D): B NEG

## 2018-10-02 LAB — SURGICAL PCR SCREEN
MRSA, PCR: NEGATIVE
Staphylococcus aureus: NEGATIVE

## 2018-10-02 LAB — CULTURE, BLOOD (ROUTINE X 2): Special Requests: ADEQUATE

## 2018-10-02 LAB — HEPARIN LEVEL (UNFRACTIONATED): Heparin Unfractionated: <0.1 IU/mL — ABNORMAL LOW (ref 0.30–0.70)

## 2018-10-02 SURGERY — AMPUTATION BELOW KNEE
Anesthesia: Monitor Anesthesia Care | Site: Knee | Laterality: Left

## 2018-10-02 MED ORDER — PROPOFOL 500 MG/50ML IV EMUL
INTRAVENOUS | Status: DC | PRN
  Administered 2018-10-02: 85 ug/kg/min via INTRAVENOUS

## 2018-10-02 MED ORDER — HEPARIN BOLUS VIA INFUSION
2000.0000 [IU] | Freq: Once | INTRAVENOUS | Status: AC
  Administered 2018-10-02: 2000 [IU] via INTRAVENOUS
  Filled 2018-10-02: qty 2000

## 2018-10-02 MED ORDER — ROPIVACAINE HCL 5 MG/ML IJ SOLN
INTRAMUSCULAR | Status: DC | PRN
  Administered 2018-10-02: 25 mL via PERINEURAL

## 2018-10-02 MED ORDER — OXYCODONE-ACETAMINOPHEN 5-325 MG PO TABS
1.0000 | ORAL_TABLET | ORAL | Status: DC | PRN
  Administered 2018-10-03 – 2018-10-08 (×18): 2 via ORAL
  Administered 2018-10-09: 09:00:00 1 via ORAL
  Administered 2018-10-09 (×3): 2 via ORAL
  Administered 2018-10-09: 09:00:00 1 via ORAL
  Administered 2018-10-10: 2 via ORAL
  Filled 2018-10-02 (×12): qty 2
  Filled 2018-10-02: qty 1
  Filled 2018-10-02 (×11): qty 2
  Filled 2018-10-02: qty 1
  Filled 2018-10-02 (×4): qty 2

## 2018-10-02 MED ORDER — PIPERACILLIN-TAZOBACTAM 3.375 G IVPB 30 MIN
3.3750 g | Freq: Once | INTRAVENOUS | Status: AC
  Administered 2018-10-02: 3.375 g via INTRAVENOUS
  Filled 2018-10-02: qty 50

## 2018-10-02 MED ORDER — DOUBLE ANTIBIOTIC 500-10000 UNIT/GM EX OINT
TOPICAL_OINTMENT | CUTANEOUS | Status: AC
  Filled 2018-10-02: qty 1

## 2018-10-02 MED ORDER — MIDAZOLAM HCL 2 MG/2ML IJ SOLN
INTRAMUSCULAR | Status: AC
  Filled 2018-10-02: qty 2

## 2018-10-02 MED ORDER — PIPERACILLIN-TAZOBACTAM 3.375 G IVPB 30 MIN: 3.3750 g | Freq: Once | INTRAVENOUS | Status: DC

## 2018-10-02 MED ORDER — 0.9 % SODIUM CHLORIDE (POUR BTL) OPTIME
TOPICAL | Status: DC | PRN
  Administered 2018-10-02: 1000 mL

## 2018-10-02 MED ORDER — MIDAZOLAM HCL 2 MG/2ML IJ SOLN
1.0000 mg | Freq: Once | INTRAMUSCULAR | Status: AC
  Administered 2018-10-02: 13:00:00 1 mg via INTRAVENOUS

## 2018-10-02 MED ORDER — MIDAZOLAM HCL 2 MG/2ML IJ SOLN
INTRAMUSCULAR | Status: AC
  Administered 2018-10-02: 1 mg via INTRAVENOUS
  Filled 2018-10-02: qty 2

## 2018-10-02 MED ORDER — ONDANSETRON HCL 4 MG/2ML IJ SOLN
INTRAMUSCULAR | Status: DC | PRN
  Administered 2018-10-02: 4 mg via INTRAVENOUS

## 2018-10-02 MED ORDER — BUPIVACAINE-EPINEPHRINE (PF) 0.5% -1:200000 IJ SOLN
INTRAMUSCULAR | Status: DC | PRN
  Administered 2018-10-02: 25 mL via PERINEURAL

## 2018-10-02 MED ORDER — BACITRACIN ZINC 500 UNIT/GM EX OINT
TOPICAL_OINTMENT | CUTANEOUS | Status: DC | PRN
  Administered 2018-10-02: 1 via TOPICAL

## 2018-10-02 MED ORDER — FENTANYL CITRATE (PF) 100 MCG/2ML IJ SOLN
INTRAMUSCULAR | Status: AC
  Filled 2018-10-02: qty 2

## 2018-10-02 MED ORDER — HYDROMORPHONE HCL 1 MG/ML IJ SOLN
0.5000 mg | INTRAMUSCULAR | Status: DC | PRN
  Administered 2018-10-03: 1 mg via INTRAVENOUS
  Filled 2018-10-02 (×2): qty 1

## 2018-10-02 MED ORDER — HEPARIN (PORCINE) 25000 UT/250ML-% IV SOLN
2300.0000 [IU]/h | INTRAVENOUS | Status: DC
  Administered 2018-10-02: 1050 [IU]/h via INTRAVENOUS
  Administered 2018-10-03: 1350 [IU]/h via INTRAVENOUS
  Administered 2018-10-04: 2100 [IU]/h via INTRAVENOUS
  Administered 2018-10-04: 10:00:00 1900 [IU]/h via INTRAVENOUS
  Administered 2018-10-05 – 2018-10-09 (×10): 2300 [IU]/h via INTRAVENOUS
  Filled 2018-10-02 (×19): qty 250

## 2018-10-02 MED ORDER — FENTANYL CITRATE (PF) 250 MCG/5ML IJ SOLN
INTRAMUSCULAR | Status: AC
  Filled 2018-10-02: qty 5

## 2018-10-02 MED ORDER — SODIUM CHLORIDE 0.9 % IV SOLN
INTRAVENOUS | Status: DC
  Administered 2018-10-02 (×2): via INTRAVENOUS

## 2018-10-02 MED ORDER — SODIUM CHLORIDE 0.9 % IV SOLN
INTRAVENOUS | Status: DC | PRN
  Administered 2018-10-02: 15:00:00 50 ug/min via INTRAVENOUS

## 2018-10-02 SURGICAL SUPPLY — 49 items
BANDAGE ESMARK 6X9 LF (GAUZE/BANDAGES/DRESSINGS) ×1 IMPLANT
BLADE SAW RECIP 87.9 MT (BLADE) ×2 IMPLANT
BNDG COHESIVE 6X5 TAN STRL LF (GAUZE/BANDAGES/DRESSINGS) ×2 IMPLANT
BNDG ELASTIC 4X5.8 VLCR STR LF (GAUZE/BANDAGES/DRESSINGS) ×4 IMPLANT
BNDG ESMARK 6X9 LF (GAUZE/BANDAGES/DRESSINGS) ×2
BNDG GAUZE ELAST 4 BULKY (GAUZE/BANDAGES/DRESSINGS) ×3 IMPLANT
CANISTER SUCT 3000ML PPV (MISCELLANEOUS) ×4 IMPLANT
CLIP VESOCCLUDE MED 6/CT (CLIP) IMPLANT
COVER SURGICAL LIGHT HANDLE (MISCELLANEOUS) ×2 IMPLANT
COVER WAND RF STERILE (DRAPES) ×2 IMPLANT
CUFF TOURN SGL QUICK 24 (TOURNIQUET CUFF)
CUFF TOURN SGL QUICK 34 (TOURNIQUET CUFF)
CUFF TOURN SGL QUICK 42 (TOURNIQUET CUFF) IMPLANT
CUFF TRNQT CYL 24X4X16.5-23 (TOURNIQUET CUFF) IMPLANT
CUFF TRNQT CYL 34X4.125X (TOURNIQUET CUFF) IMPLANT
DRAIN CHANNEL 19F RND (DRAIN) ×1 IMPLANT
DRAPE HALF SHEET 40X57 (DRAPES) ×2 IMPLANT
DRAPE ORTHO SPLIT 77X108 STRL (DRAPES) ×2
DRAPE SURG ORHT 6 SPLT 77X108 (DRAPES) ×2 IMPLANT
DRAPE U-SHAPE 47X51 STRL (DRAPES) ×2 IMPLANT
DRSG ADAPTIC 3X8 NADH LF (GAUZE/BANDAGES/DRESSINGS) ×2 IMPLANT
ELECT REM PT RETURN 9FT ADLT (ELECTROSURGICAL) ×2
ELECTRODE REM PT RTRN 9FT ADLT (ELECTROSURGICAL) ×1 IMPLANT
EVACUATOR SILICONE 100CC (DRAIN) ×1 IMPLANT
GAUZE SPONGE 4X4 12PLY STRL (GAUZE/BANDAGES/DRESSINGS) ×3 IMPLANT
GLOVE BIO SURGEON STRL SZ7.5 (GLOVE) ×2 IMPLANT
GLOVE BIOGEL PI IND STRL 8 (GLOVE) ×1 IMPLANT
GLOVE BIOGEL PI INDICATOR 8 (GLOVE) ×1
GOWN STRL REUS W/ TWL LRG LVL3 (GOWN DISPOSABLE) ×3 IMPLANT
GOWN STRL REUS W/TWL LRG LVL3 (GOWN DISPOSABLE) ×3
KIT BASIN OR (CUSTOM PROCEDURE TRAY) ×2 IMPLANT
KIT TURNOVER KIT B (KITS) ×2 IMPLANT
NS IRRIG 1000ML POUR BTL (IV SOLUTION) ×2 IMPLANT
PACK GENERAL/GYN (CUSTOM PROCEDURE TRAY) ×2 IMPLANT
PAD ARMBOARD 7.5X6 YLW CONV (MISCELLANEOUS) ×4 IMPLANT
RASP HELIOCORDIAL MED (MISCELLANEOUS) IMPLANT
STAPLER VISISTAT (STAPLE) ×2 IMPLANT
STOCKINETTE IMPERVIOUS LG (DRAPES) ×2 IMPLANT
SUT ETHILON 3 0 PS 1 (SUTURE) ×1 IMPLANT
SUT SILK 0 TIES 10X30 (SUTURE) ×2 IMPLANT
SUT SILK 2 0 (SUTURE) ×1
SUT SILK 2 0 SH CR/8 (SUTURE) ×3 IMPLANT
SUT SILK 2-0 18XBRD TIE 12 (SUTURE) ×1 IMPLANT
SUT SILK 3 0 (SUTURE) ×1
SUT SILK 3-0 18XBRD TIE 12 (SUTURE) ×1 IMPLANT
SUT VIC AB 2-0 CT1 18 (SUTURE) ×3 IMPLANT
TOWEL GREEN STERILE (TOWEL DISPOSABLE) ×4 IMPLANT
UNDERPAD 30X30 (UNDERPADS AND DIAPERS) ×2 IMPLANT
WATER STERILE IRR 1000ML POUR (IV SOLUTION) ×2 IMPLANT

## 2018-10-02 NOTE — Progress Notes (Signed)
ANTICOAGULATION CONSULT NOTE - Follow Up Consult  Pharmacy Consult for Heparin Indication: LV thrombus  Allergies  Allergen Reactions  . Amoxicillin-Pot Clavulanate Nausea And Vomiting    Did it involve swelling of the face/tongue/throat, SOB, or low BP? No Did it involve sudden or severe rash/hives, skin peeling, or any reaction on the inside of your mouth or nose? No Did you need to seek medical attention at a hospital or doctor's office? No When did it last happen?6 months ago If all above answers are "NO", may proceed with cephalosporin use.    Patient Measurements: Height: 6\' 3"  (190.5 cm) Weight: 180 lb 8.9 oz (81.9 kg) IBW/kg (Calculated) : 84.5 Heparin Dosing Weight: 81.9 kg  Vital Signs: Temp: 97.8 F (36.6 C) (09/15 1708) Temp Source: Oral (09/15 1708) BP: 116/79 (09/15 1708) Pulse Rate: 78 (09/15 1708)  Labs: Recent Labs    09/30/18 0054 10/01/18 0236 10/02/18 0441  HGB 6.9* 8.2*  --   HCT 22.0* 26.2*  --   PLT 399 386  --   LABPROT 16.7*  --   --   INR 1.4*  --   --   HEPARINUNFRC  --   --  <0.10*  CREATININE 1.42* 1.78*  --     Estimated Creatinine Clearance: 53 mL/min (A) (by C-G formula based on SCr of 1.78 mg/dL (H)).  Assessment:  58 yr old male started on IV heparin for LV thrombus last night.    Initial heparin level was undetectable (<0.10) on 900 unit/hr;  heparin 2000 units IV bolus given and infusion rate increased to 1050 units/hr ~6am today. Then heparin held for OR this afternoon.  S/p left BKA, IV heparin to resume at midnight tonight, ~ 8 hours post-op.  Goal of Therapy:  Heparin level 0.3-0.7 units/ml Monitor platelets by anticoagulation protocol: Yes   Plan:   Resume heparin drip at midnight tonight at 1050 units/hr.  Heparin level ~8 hrs after drip resumes.  Daily heparin level and CBC.  Arty Baumgartner, [place] Pager: [phone] or phone: [phone] 10/02/2018,9:02 PM

## 2018-10-02 NOTE — Progress Notes (Signed)
PROGRESS NOTE  Daniel Moon [id number] DOB: 04-05-1960 DOA: [id number] PCP: Sanjuana Letters, MD  HPI/Recap of past 24 hours: Daniel Moon a 58 y.o.malewith medical history significant forDM, peripheral neuropathy, A. Fib-paroxysmal not on anticoagulation,, nonhealing left transmetatarsal amputation wound and limb threatening ischemia being admitted with sepsis likely due to osteomyelitis. The patient is a poor historian but tells me he started having fevers today at home. He says he was feeling fine until today when he says his family heard him wake up yelling in his sleep. He was found to have a fever and his family called EMS. He last had balloon angioplasty in July and says he was given antibiotics after that procedure but didn't take them since they made him feel sick. Since that time, he has not seen a physician.In ED he was found to be febrile, have a leukocytosis and XR of foot shows evidence of osteomyelitis. He was given IVF but gently due to his history of CHF, and his blood pressure is stable. He was given empiric vancomycin and zosyn.   Assessment/Plan: Principal Problem:   MRSA bacteremia Active Problems:   HTN (hypertension)   Diabetes ([place])   HLD (hyperlipidemia)   COPD (chronic obstructive pulmonary disease) (HCC)   Sepsis (HCC)   Open wound of left foot   Osteomyelitis (HCC)   Microcytic anemia   PAD (peripheral artery disease) (HCC)   Peripheral neuropathy   Cigarette smoker   S/P transmetatarsal amputation of foot, left (HCC)   Pressure injury of skin   Chronic renal insufficiency, stage 3 (moderate) (HCC)   Paroxysmal atrial fibrillation (HCC)   Unintentional weight loss   Poor dentition   Sepsis secondary to left lower extremity osteomyelitis/MRSA bacteremia, POA 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 fib and medication noncompliance Will likely need ongoing anticoagulation postoperatively -patient denies compliance with previous anticoagulation Currently on heparin drip -on hold perioperatively per surgery  AKI on CKD 2, ongoing Baseline creatinine appears to be 1.4 with GFR greater than 60 Creatinine 1.78 with GFR 48 Continue IV fluids perioperatively given n.p.o.  Hyperkalemia Not on potassium supplement Potassium 5.4 previously received calcium gluconate; EKG unremarkable Follow with morning labs  Iron deficiency anemia/anemia of chronic disease Presented with hemoglobin of 6.9 Transfuse 1 unit PRBC on 09/30/2018 Iron studies done on 09/30/2018 significant for iron deficiency Hold off IV iron supplement in the setting of active infective process Start p.o. ferrous sulfate 325 daily Monitor H&H  Peripheral vascular disease ABI ordered and pending Hold off Plavix due to possible surgery  Chronic systolic CHF, EF [id number] Reported noncompliant with his medications Seen at [facility] remarkable for 2.6 x 2 cm LV thrombus Strict I's and O's, daily weights  Hyperglycemia, possibly stress-induced Obtain hemoglobin A1c Start sensitive insulin sliding scale  Medication noncompliance Lengthy discussion at bedside about need for medication compliance Patient educated on increased risk of morbidity mortality should he continue to be noncompliant with medications and medical therapy  DVT prophylaxis: Currently on heparin drip -initiated 10/02/2018 Code Status: Full code Family  Communication: None at bedside Disposition Plan: Pending  surgical evaluation and treatment, likely disposition home with home health versus SNF pending PT recommendations postoperatively.  Consultants:  Vascular surgery  Procedures:  Left BKA 10/02/2018  Antimicrobials: IV vancomycin and IV Zosyn     Objective: Vitals:   10/01/18 1600 10/01/18 1700 10/01/18 1800 10/01/18 1930  BP: 98/64 104/74 109/62 122/89  Pulse:   74 78  Resp: 11 11 (!) 21 18  Temp:    (!) 97.5 F (36.4 C)  TempSrc:    Oral  SpO2: 100%  100% 100%  Weight:    81.9 kg  Height:    6\' 3"  (1.905 m)    Intake/Output Summary (Last 24 hours) at 10/02/2018 0744 Last data filed at 10/02/2018 0634 Gross per 24 hour  Intake 1597.45 ml  Output 425 ml  Net 1172.45 ml   Filed Weights   09/30/18 0330 09/30/18 0606 10/01/18 1930  Weight: 75.6 kg 75.6 kg 81.9 kg    Exam:  General:  Pleasantly resting in bed, No acute distress. HEENT:  Normocephalic atraumatic.  Sclerae nonicteric, noninjected.  Extraocular movements intact bilaterally. Neck:  Without mass or deformity.  Trachea is midline. Lungs:  Clear to auscultate bilaterally without rhonchi, wheeze, or rales. Heart:  Regular rate and rhythm.  Without murmurs, rubs, or gallops. Abdomen:  Soft, nontender, nondistended.  Without guarding or rebound. Extremities: Without cyanosis, clubbing, edema, or obvious deformity.  Left lower extremity bandage clean dry intact.  Data Reviewed: CBC: Recent Labs  Lab 09/30/18 0054 10/01/18 0236  WBC 25.0* 15.8*  NEUTROABS 22.2* 11.7*  HGB 6.9* 8.2*  HCT 22.0* 26.2*  MCV 68.8* 70.8*  PLT 399 [id number]   Basic Metabolic Panel: Recent Labs  Lab 09/30/18 0054 10/01/18 0236  NA 132* 134*  K 4.6 5.4*  CL 103 102  CO2 22 21*  GLUCOSE 111* 157*  BUN 24* 27*  CREATININE 1.42* 1.78*  CALCIUM 7.8* 7.6*   GFR: Estimated Creatinine Clearance: 53 mL/min (A) (by C-G formula based on SCr of 1.78 mg/dL (H)). Liver Function Tests: Recent Labs  Lab 09/30/18 0054  AST 12*  ALT 8   ALKPHOS 107  BILITOT 1.1  PROT 8.7*  ALBUMIN 1.9*   Coagulation Profile: Recent Labs  Lab 09/30/18 0054  INR 1.4*   HbA1C: Recent Labs    09/30/18 0054  HGBA1C 8.4*   CBG: Recent Labs  Lab 09/30/18 2121 10/01/18 0746 10/01/18 1152 10/01/18 1639 10/01/18 2132  GLUCAP 246* 100* 112* 146* 137*   Lipid Profile: No results for input(s): CHOL, HDL, LDLCALC, TRIG, CHOLHDL, LDLDIRECT in the last 72 hours. Thyroid Function Tests: No results for input(s): TSH, T4TOTAL, FREET4, T3FREE, THYROIDAB in the last 72 hours. Anemia Panel: Recent Labs    09/30/18 0117  FERRITIN 164  TIBC 177*  IRON 12*   Urine analysis:    Component Value Date/Time   COLORURINE AMBER (A) 09/30/2018 0051   APPEARANCEUR CLOUDY (A) 09/30/2018 0051   LABSPEC 1.024 09/30/2018 0051   PHURINE 5.0 09/30/2018 0051   GLUCOSEU NEGATIVE 09/30/2018 0051   HGBUR NEGATIVE 09/30/2018 0051   BILIRUBINUR NEGATIVE 09/30/2018 0051   KETONESUR NEGATIVE 09/30/2018 0051   PROTEINUR 100 (A) 09/30/2018 0051   NITRITE NEGATIVE 09/30/2018 0051   LEUKOCYTESUR NEGATIVE 09/30/2018 0051   Sepsis Labs: @LABRCNTIP (procalcitonin:4,lacticidven:4)  ) Recent Results (from the past 240 hour(s))  Culture, blood (Routine x 2)     Status: Abnormal (Preliminary result)   Collection Time: 09/30/18 12:54 AM  Specimen: BLOOD  Result Value Ref Range Status   Specimen Description   Final    BLOOD LEFT ANTECUBITAL Performed at [facility] [address] [place], [place] [address]    Special Requests   Final    BOTTLES DRAWN AEROBIC AND ANAEROBIC Blood Culture results may not be optimal due to an inadequate volume of blood received in culture bottles Performed at [facility] [address], [place], [place] [address]    Culture  Setup Time   Final    GRAM POSITIVE COCCI IN CLUSTERS IN BOTH AEROBIC AND ANAEROBIC BOTTLES CRITICAL RESULT CALLED TO, READ BACK BY AND VERIFIED WITH: Lavell Luster, PHARMD (WL) AT 2110 ON 09/30/18 BY C. JESSUP, MT. Performed at [facility], [place] [address] [place], [place] [address]    Culture STAPHYLOCOCCUS AUREUS (A)  Final   Report Status PENDING  Incomplete  Culture, blood (Routine x 2)     Status: Abnormal (Preliminary result)   Collection Time: 09/30/18  1:30 AM   Specimen: BLOOD RIGHT FOREARM  Result Value Ref Range Status   Specimen Description   Final    BLOOD RIGHT FOREARM Performed at [facility] [address] [place], [place] [address]    Special Requests   Final    BOTTLES DRAWN AEROBIC AND ANAEROBIC Blood Culture adequate volume Performed at [name] [address] [place], [place] [address]    Culture  Setup Time   Final    GRAM POSITIVE COCCI IN CLUSTERS IN BOTH AEROBIC AND ANAEROBIC BOTTLES CRITICAL RESULT CALLED TO, READ BACK BY AND VERIFIED WITH: Lavell Luster, PHARMD (WL) AT 2110 ON 09/30/18 BY C. JESSUP, MT.    Culture (A)  Final    STAPHYLOCOCCUS AUREUS SUSCEPTIBILITIES TO FOLLOW Performed at [facility], [place] [address], [place],  [address]    Report Status PENDING  Incomplete  Blood Culture ID Panel (Reflexed)     Status: Abnormal   Collection Time: 09/30/18  1:30 AM  Result Value Ref Range Status   Enterococcus species NOT DETECTED NOT DETECTED Final   Listeria monocytogenes NOT DETECTED NOT DETECTED Final   Staphylococcus species DETECTED (A) NOT DETECTED Final    Comment: CRITICAL RESULT CALLED TO, READ BACK BY AND VERIFIED WITH: J. GRIMSLEY, PHARMD (WL) AT 2110 ON 09/30/18 BY C. JESSUP, MT.    Staphylococcus aureus (BCID) DETECTED (A) NOT DETECTED Final    Comment: Methicillin (oxacillin)-resistant Staphylococcus aureus (MRSA). MRSA is predictably resistant to beta-lactam antibiotics (except ceftaroline). Preferred therapy is vancomycin unless clinically contraindicated. Patient requires contact precautions if  hospitalized. CRITICAL  RESULT CALLED TO, READ BACK BY AND VERIFIED WITH: J. GRIMSLEY, PHARMD (WL) AT 2110 ON 09/30/18 BY C. JESSUP, MT.    Methicillin resistance DETECTED (A) NOT DETECTED Final    Comment: CRITICAL RESULT CALLED TO, READ BACK BY AND VERIFIED WITH: J. GRIMSLEY, PHARMD (WL) AT 2110 ON 09/30/18 BY C. JESSUP, MT.    Streptococcus species NOT DETECTED NOT DETECTED Final   Streptococcus agalactiae NOT DETECTED NOT DETECTED Final   Streptococcus pneumoniae NOT DETECTED NOT DETECTED Final   Streptococcus pyogenes NOT DETECTED NOT DETECTED Final   Acinetobacter baumannii NOT DETECTED NOT DETECTED Final   Enterobacteriaceae species NOT DETECTED NOT DETECTED Final   Enterobacter cloacae complex NOT DETECTED NOT DETECTED Final   Escherichia coli NOT DETECTED NOT DETECTED Final   Klebsiella oxytoca NOT DETECTED NOT DETECTED Final   Klebsiella pneumoniae  NOT DETECTED NOT DETECTED Final   Proteus species NOT DETECTED NOT DETECTED Final   Serratia marcescens NOT DETECTED NOT DETECTED Final   Haemophilus influenzae NOT DETECTED NOT DETECTED Final   Neisseria meningitidis NOT DETECTED NOT DETECTED Final   Pseudomonas aeruginosa NOT DETECTED NOT DETECTED Final   Candida albicans NOT DETECTED NOT DETECTED Final   Candida glabrata NOT DETECTED NOT DETECTED Final   Candida krusei NOT DETECTED NOT DETECTED Final   Candida parapsilosis NOT DETECTED NOT DETECTED Final   Candida tropicalis NOT DETECTED NOT DETECTED Final    Comment: Performed at [facility], [place] [address], [place], [place] [address]  SARS Coronavirus 2 [facility] order, Performed in [facility] hospital lab) Nasopharyngeal Nasopharyngeal Swab     Status: None   Collection Time: 09/30/18  1:40 AM   Specimen: Nasopharyngeal Swab  Result Value Ref Range Status   SARS Coronavirus 2 NEGATIVE NEGATIVE Final    Comment: (NOTE) If result is NEGATIVE SARS-CoV-2 target nucleic acids are NOT DETECTED. The SARS-CoV-2 RNA is generally detectable in  upper and lower  respiratory specimens during the acute phase of infection. The lowest  concentration of SARS-CoV-2 viral copies this assay can detect is 250  copies / mL. A negative result does not preclude SARS-CoV-2 infection  and should not be used as the sole basis for treatment or other  patient management decisions.  A negative result may occur with  improper specimen collection / handling, submission of specimen other  than nasopharyngeal swab, presence of viral mutation(s) within the  areas targeted by this assay, and inadequate number of viral copies  (<250 copies / mL). A negative result must be combined with clinical  observations, patient history, and epidemiological information. If result is POSITIVE SARS-CoV-2 target nucleic acids are DETECTED. The SARS-CoV-2 RNA is generally detectable in upper and lower  respiratory specimens dur ing the acute phase of infection.  Positive  results are indicative of active infection with SARS-CoV-2.  Clinical  correlation with patient history and other diagnostic information is  necessary to determine patient infection status.  Positive results do  not rule out bacterial infection or co-infection with other viruses. If result is PRESUMPTIVE POSTIVE SARS-CoV-2 nucleic acids MAY BE PRESENT.   A presumptive positive result was obtained on the submitted specimen  and confirmed on repeat testing.  While 2019 novel coronavirus  (SARS-CoV-2) nucleic acids may be present in the submitted sample  additional confirmatory testing may be necessary for epidemiological  and / or clinical management purposes  to differentiate between  SARS-CoV-2 and other Sarbecovirus currently known to infect humans.  If clinically indicated additional testing with an alternate test  methodology [phone]) is advised. The SARS-CoV-2 RNA is generally  detectable in upper and lower respiratory sp ecimens during the acute  phase of infection. The expected result is  Negative. Fact Sheet for Patients:  StrictlyIdeas.no Fact Sheet for Healthcare Providers: BankingDealers.co.za This test is not yet approved or cleared by the [place] FDA and has been authorized for detection and/or diagnosis of SARS-CoV-2 by FDA under an Emergency Use Authorization (EUA).  This EUA will remain in effect (meaning this test can be used) for the duration of the COVID-19 declaration under Section 564(b)(1) of the Act, 21 U.S.C. section 360bbb-3(b)(1), unless the authorization is terminated or revoked sooner. Performed at [facility], [place] [address], [place], [place] [address]   MRSA PCR Screening     Status: None   Collection Time:  09/30/18  4:01 AM   Specimen: Nasal Mucosa; Nasopharyngeal  Result Value Ref Range Status   MRSA by PCR NEGATIVE NEGATIVE Final    Comment:        The GeneXpert MRSA Assay (FDA approved for NASAL specimens only), is one component of a comprehensive MRSA colonization surveillance program. It is not intended to diagnose MRSA infection nor to guide or monitor treatment for MRSA infections. Performed at [facility], [place] [address], [place], [place] [address]     Studies: No results found.  Scheduled Meds: . amLODipine  10 mg Oral Daily  . atorvastatin  40 mg Oral QHS  . carvedilol  12.5 mg Oral BID  . Chlorhexidine Gluconate Cloth  6 each Topical Daily  . ferrous sulfate  325 mg Oral Q breakfast  . gabapentin  300 mg Oral QHS  . insulin aspart  0-15 Units Subcutaneous TID WC  . insulin NPH Human  10 Units Subcutaneous BID AC & HS  . nicotine  21 mg Transdermal Daily  . polyethylene glycol  17 g Oral Daily  . senna-docusate  2 tablet Oral BID  . sodium bicarbonate  650 mg Oral BID    Continuous Infusions: . sodium chloride 50 mL/hr at 10/02/18 0634  . heparin 1,050 Units/hr (10/02/18 0634)  . piperacillin-tazobactam (ZOSYN)  IV 3.375 g  (10/02/18 0517)  . vancomycin Stopped (10/02/18 0058)     LOS: 2 days   Time spent: greater than 35 minutes  Little Ishikawa, DO [facility] Pager [phone]  If 7PM-7AM, please contact night-coverage [email or web address] Password [facility] 10/02/2018, 7:44 AM

## 2018-10-02 NOTE — Anesthesia Procedure Notes (Signed)
Anesthesia Regional Block: Femoral nerve block   Pre-Anesthetic Checklist: ,, timeout performed, Correct Patient, Correct Site, Correct Laterality, Correct Procedure, Correct Position, site marked, Risks and benefits discussed,  Surgical consent,  Pre-op evaluation,  At surgeon's request and post-op pain management  Laterality: Left  Prep: chloraprep       Needles:  Injection technique: Single-shot  Needle Type: Echogenic Stimulator Needle     Needle Length: 9cm  Needle Gauge: 21     Additional Needles:   Procedures:, nerve stimulator,,, ultrasound used (permanent image in chart),,,,   Nerve Stimulator or Paresthesia:  Response: quad, 0.8 mA,   Additional Responses:   Narrative:  Start time: 10/02/2018 12:48 PM End time: 10/02/2018 12:55 PM Injection made incrementally with aspirations every 5 mL.  Performed by: Personally  Anesthesiologist: Suzette Battiest, MD

## 2018-10-02 NOTE — Op Note (Signed)
    NAME: Daniel Moon    MRN: [id number] DOB: 05-14-1960    DATE OF OPERATION: 10/02/2018  PREOP DIAGNOSIS:    Ischemic left lower extremity  POSTOP DIAGNOSIS:    Same  PROCEDURE:    Left below the knee amputation  SURGEON: Judeth Cornfield. Scot Dock, MD, FACS  ASSIST: Arlee Muslim, PA  ANESTHESIA: General  EBL: 100 cc  INDICATIONS:    Grantlee Slankard is a 58 y.o. male who had a nonsalvageable left foot.  I was asked to perform left below the knee amputation.  FINDINGS:   There was reasonable bleeding at the amputation site.  TECHNIQUE:   The patient was taken to the operating room after receiving a block.  The left leg was prepped and draped in usual sterile fashion.  I measured the circumference in the limb 10 cm distal to the tibial tuberosity and two thirds of this distance was used to mark the anterior incision.  A long posterior incision of equal length was marked.  A tourniquet had been placed on the upper thigh.  The leg was exsanguinated with an Esmarch bandage and the tourniquet inflated to 300 mmHg.  Under tourniquet control incision was carried down to the skin, subcutaneous tissue, fascia and muscle to the tibia and fibula which were dissected free circumferentially.  The anterior aspect of the tibia was beveled.  The arteries and veins were individually suture ligated with 2-0 silk ties.  The tourniquet was then released.  Additional hemostasis was obtained using electrocautery and 2-0 silk ties.  There was good bleeding once the tourniquet was released.  The edges of the bone were rasped.  The fascial layer was then closed with interrupted 2-0 Vicryl's.  Of note I placed a 15 Blake drain given that the patient will need to be on heparin because of his history of ventricular thrombus.  The skin was closed with staples.  A sterile dressing was applied.  The patient tolerated the procedure well was transferred to the recovery room in stable condition.  All needle and  sponge counts were correct.  Deitra Mayo, MD, FACS [facility]  DATE OF DICTATION:   10/02/2018

## 2018-10-02 NOTE — Progress Notes (Signed)
[place] for Heparin Indication: LV thrombus  Allergies  Allergen Reactions  . Amoxicillin-Pot Clavulanate Nausea And Vomiting    Did it involve swelling of the face/tongue/throat, SOB, or low BP? No Did it involve sudden or severe rash/hives, skin peeling, or any reaction on the inside of your mouth or nose? No Did you need to seek medical attention at a hospital or doctor's office? No When did it last happen?6 months ago If all above answers are "NO", may proceed with cephalosporin use.    Patient Measurements: Height: 6\' 3"  (190.5 cm) Weight: 180 lb 8.9 oz (81.9 kg) IBW/kg (Calculated) : 84.5   Vital Signs: Temp: 97.5 F (36.4 C) (09/14 1930) Temp Source: Oral (09/14 1930) BP: 122/89 (09/14 1930) Pulse Rate: 78 (09/14 1930)  Labs: Recent Labs    09/30/18 0054 10/01/18 0236 10/02/18 0441  HGB 6.9* 8.2*  --   HCT 22.0* 26.2*  --   PLT 399 386  --   LABPROT 16.7*  --   --   INR 1.4*  --   --   HEPARINUNFRC  --   --  <0.10*  CREATININE 1.42* 1.78*  --     Estimated Creatinine Clearance: 53 mL/min (A) (by C-G formula based on SCr of 1.78 mg/dL (H)).   Medical History: Past Medical History:  Diagnosis Date  . Diabetes mellitus without complication ([place])   . Heart attack ([place])   . Hepatitis C   . HLD (hyperlipidemia)   . HTN (hypertension)   . Peripheral vascular disease ([place])   . Systolic CHF [facility])     Assessment: 58 year old male to begin heparin for LV thrombus Planning OR tomorrow for left BKA  9/15 AM update:  Heparin level low  No issues per RN  Goal of Therapy:  Heparin level 0.3-0.7 units/ml Monitor platelets by anticoagulation protocol: Yes   Plan:  Heparin 200 units [employer] heparin to 1050 units/hr Surgery scheduled for 1330-no f/u heparin level ordered Heparin off on call to the OR F/U heparin re-start post-op  Narda Bonds, PharmD, [place] Pharmacist Phone: [phone]

## 2018-10-02 NOTE — Interval H&P Note (Signed)
History and Physical Interval Note:  10/02/2018 2:08 PM  Daniel Moon  has presented today for surgery, with the diagnosis of nonviable tissue left lower extremity.  The various methods of treatment have been discussed with the patient and family. After consideration of risks, benefits and other options for treatment, the patient has consented to  Procedure(s): AMPUTATION BELOW KNEE (Left) as a surgical intervention.  The patient's history has been reviewed, patient examined, no change in status, stable for surgery.  I have reviewed the patient's chart and labs.  Questions were answered to the patient's satisfaction.     Deitra Mayo

## 2018-10-02 NOTE — Anesthesia Procedure Notes (Signed)
Anesthesia Regional Block: Popliteal block   Pre-Anesthetic Checklist: ,, timeout performed, Correct Patient, Correct Site, Correct Laterality, Correct Procedure, Correct Position, site marked, Risks and benefits discussed,  Surgical consent,  Pre-op evaluation,  At surgeon's request and post-op pain management  Laterality: Left  Prep: chloraprep       Needles:  Injection technique: Single-shot  Needle Type: Echogenic Needle     Needle Length: 9cm  Needle Gauge: 21     Additional Needles:   Procedures:,,,, ultrasound used (permanent image in chart),,,,  Narrative:  Start time: 10/02/2018 12:40 PM End time: 10/02/2018 12:46 PM Injection made incrementally with aspirations every 5 mL.  Performed by: Personally  Anesthesiologist: Suzette Battiest, MD

## 2018-10-02 NOTE — Transfer of Care (Signed)
Immediate Anesthesia Transfer of Care Note  Patient: Daniel Moon  Procedure(s) Performed: left BELOW KNEE AMPUTATION (Left Knee)  Patient Location: PACU  Anesthesia Type:MAC combined with regional for post-op pain  Level of Consciousness: awake, alert  and oriented  Airway & Oxygen Therapy: Patient Spontanous Breathing  Post-op Assessment: Report given to RN and Post -op Vital signs reviewed and stable  Post vital signs: Reviewed and stable  Last Vitals:  Vitals Value Taken Time  BP 108/82 10/02/18 1628  Temp    Pulse 78 10/02/18 1630  Resp 24 10/02/18 1630  SpO2 94 % 10/02/18 1630  Vitals shown include unvalidated device data.  Last Pain:  Vitals:   10/02/18 0938  TempSrc:   PainSc: 0-No pain      Patients Stated Pain Goal: 1 (81/27/51 7001)  Complications: No apparent anesthesia complications

## 2018-10-02 NOTE — Anesthesia Preprocedure Evaluation (Signed)
Anesthesia Evaluation  Patient identified by MRN, date of birth, ID band Patient awake    Reviewed: Allergy & Precautions, NPO status , Patient's Chart, lab work & pertinent test results  Airway Mallampati: II  TM Distance: >3 FB Neck ROM: Full    Dental  (+) Dental Advisory Given   Pulmonary COPD, Current Smoker,    breath sounds clear to auscultation       Cardiovascular hypertension, Pt. on medications + Past MI, + Peripheral Vascular Disease and +CHF   Rhythm:Regular Rate:Normal  EF 20-25%. Mod/sev MR and Mod TR   Neuro/Psych negative neurological ROS     GI/Hepatic negative GI ROS, (+) Hepatitis -, C  Endo/Other  diabetes, Type 2  Renal/GU CRFRenal disease     Musculoskeletal   Abdominal   Peds  Hematology  (+) anemia ,   Anesthesia Other Findings   Reproductive/Obstetrics                             Lab Results  Component Value Date   WBC 15.8 (H) 10/01/2018   HGB 8.2 (L) 10/01/2018   HCT 26.2 (L) 10/01/2018   MCV 70.8 (L) 10/01/2018   PLT 386 10/01/2018   Lab Results  Component Value Date   CREATININE 1.78 (H) 10/01/2018   BUN 27 (H) 10/01/2018   NA 134 (L) 10/01/2018   K 5.4 (H) 10/01/2018   CL 102 10/01/2018   CO2 21 (L) 10/01/2018    Anesthesia Physical Anesthesia Plan  ASA: IV  Anesthesia Plan: Regional and MAC   Post-op Pain Management:    Induction: Intravenous  PONV Risk Score and Plan: 0 and Propofol infusion, Ondansetron and Treatment may vary due to age or medical condition  Airway Management Planned: Natural Airway and Simple Face Mask  Additional Equipment:   Intra-op Plan:   Post-operative Plan:   Informed Consent: I have reviewed the patients History and Physical, chart, labs and discussed the procedure including the risks, benefits and alternatives for the proposed anesthesia with the patient or authorized representative who has indicated  his/her understanding and acceptance.       Plan Discussed with: CRNA  Anesthesia Plan Comments:         Anesthesia Quick Evaluation

## 2018-10-02 NOTE — Progress Notes (Signed)
[facility] for Infectious Disease  Date of Admission:  09/30/2018     Total days of antibiotics 4         ASSESSMENT:  Mr. 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 Planned for left knee AKA today with Vascular Surgery. Will need repeat blood to confirm clearance of bacteremia and TEE to rule out endocarditis and possibly evaluation by CVTS pending TEE results. Continue current vancomycin. Will stop piperacillin-tazobactam given planned amputation today. Will require prolonged IV therapy with duration to be determined pending additional work up. Previously noted to have positive Hepatitis C RNA in 01/2014. Will recheck status to determine need for treatment.   PLAN:  1. Continue vancomycin with dosing AUC per pharmacy protocol. 2. Discontinue piperacillin-tazobactam with scheduled AKA amputation 3. TEE to be ordered.  4. Repeat blood cultures.  5. Hepatitis C RNA level.    Principal Problem:   MRSA bacteremia Active Problems:   HTN (hypertension)   Diabetes ([place])   HLD (hyperlipidemia)   COPD (chronic obstructive pulmonary disease) (HCC)   Sepsis (HCC)   Open wound of left foot   Osteomyelitis (HCC)   Microcytic anemia   PAD (peripheral artery disease) (HCC)   Peripheral neuropathy   Cigarette smoker   S/P transmetatarsal amputation of foot, left (HCC)   Pressure injury of skin   Chronic renal insufficiency, stage 3 (moderate) (HCC)   Paroxysmal atrial fibrillation (HCC)   Unintentional weight loss   Poor dentition   . amLODipine  10 mg Oral Daily  . atorvastatin  40 mg Oral QHS  . carvedilol  12.5 mg Oral BID  . Chlorhexidine Gluconate Cloth  6 each Topical Daily  . ferrous sulfate  325 mg Oral Q breakfast  . gabapentin  300 mg Oral QHS  . insulin aspart  0-15  Units Subcutaneous TID WC  . insulin NPH Human  10 Units Subcutaneous BID AC & HS  . nicotine  21 mg Transdermal Daily  . polyethylene glycol  17 g Oral Daily  . senna-docusate  2 tablet Oral BID  . sodium bicarbonate  650 mg Oral BID    SUBJECTIVE:  Afebrile in the past 24 hours and improving WBC count. Transferred to [name] with Vascular Surgery today. Continues to have pain in his left leg.   Allergies  Allergen Reactions  . Amoxicillin-Pot Clavulanate Nausea And Vomiting    Did it involve swelling of the face/tongue/throat, SOB, or low BP? No Did it involve sudden or severe rash/hives, skin peeling, or any reaction on the inside of your mouth or nose? No Did you need to seek medical attention at a hospital or doctor's office? No When did it last happen?6 months ago If all above answers are "NO", may proceed with cephalosporin use.     Review of Systems: Review of Systems  Constitutional: Negative for chills, fever and weight loss.  Respiratory: Negative for cough, shortness of breath and wheezing.   Cardiovascular: Negative for chest pain and leg swelling.  Gastrointestinal: Negative for abdominal pain, constipation, diarrhea, nausea and vomiting.  Skin: Negative for rash.      OBJECTIVE: Vitals:   10/01/18 1600 10/01/18 1700 10/01/18 1800 10/01/18 1930  BP: 98/64 104/74 109/62 122/89  Pulse:   74 78  Resp: 11 11 (!) 21 18  Temp:    Marland Kitchen)  97.5 F (36.4 C)  TempSrc:    Oral  SpO2: 100%  100% 100%  Weight:    81.9 kg  Height:    6\' 3"  (1.905 m)   Body mass index is 22.57 kg/m.  Physical Exam Constitutional:      General: He is not in acute distress.    Appearance: He is well-developed.  Cardiovascular:     Rate and Rhythm: Normal rate and regular rhythm.     Heart sounds: Murmur present.  Pulmonary:     Effort: Pulmonary effort is normal.     Breath sounds: Normal breath sounds. No wheezing or rhonchi.  Abdominal:     General: Bowel sounds  are normal.  Musculoskeletal:     Comments: Left ankle wrapped with dressing that is clean and dry  Skin:    General: Skin is warm and dry.  Neurological:     Mental Status: He is alert. Mental status is at baseline.  Psychiatric:        Mood and Affect: Mood normal.     Lab Results Lab Results  Component Value Date   WBC 15.8 (H) 10/01/2018   HGB 8.2 (L) 10/01/2018   HCT 26.2 (L) 10/01/2018   MCV 70.8 (L) 10/01/2018   PLT 386 10/01/2018    Lab Results  Component Value Date   CREATININE 1.78 (H) 10/01/2018   BUN 27 (H) 10/01/2018   NA 134 (L) 10/01/2018   K 5.4 (H) 10/01/2018   CL 102 10/01/2018   CO2 21 (L) 10/01/2018    Lab Results  Component Value Date   ALT 8 09/30/2018   AST 12 (L) 09/30/2018   ALKPHOS 107 09/30/2018   BILITOT 1.1 09/30/2018     Microbiology: Recent Results (from the past 240 hour(s))  Culture, blood (Routine x 2)     Status: Abnormal   Collection Time: 09/30/18 12:54 AM   Specimen: BLOOD  Result Value Ref Range Status   Specimen Description   Final    BLOOD LEFT ANTECUBITAL Performed at [facility], [place] [address], [place], [place] [address]    Special Requests   Final    BOTTLES DRAWN AEROBIC AND ANAEROBIC Blood Culture results may not be optimal due to an inadequate volume of blood received in culture bottles Performed at [facility] [address], [place], [place] [address]    Culture  Setup Time   Final    GRAM POSITIVE COCCI IN CLUSTERS IN BOTH AEROBIC AND ANAEROBIC BOTTLES CRITICAL RESULT CALLED TO, READ BACK BY AND VERIFIED WITH: J. GRIMSLEY, PHARMD (WL) AT 2110 ON 09/30/18 BY C. JESSUP, MT.    Culture (A)  Final    STAPHYLOCOCCUS AUREUS SUSCEPTIBILITIES PERFORMED ON PREVIOUS CULTURE WITHIN THE LAST 5 DAYS. Performed at [facility], [place] [address], [place], [place] [address]    Report Status 10/02/2018 FINAL  Final  Culture, blood (Routine x 2)     Status: Abnormal    Collection Time: 09/30/18  1:30 AM   Specimen: BLOOD RIGHT FOREARM  Result Value Ref Range Status   Specimen Description   Final    BLOOD RIGHT FOREARM Performed at [facility] [address], [place],  [address]    Special Requests   Final    BOTTLES DRAWN AEROBIC AND ANAEROBIC Blood Culture adequate volume Performed at [facility] [address] [place],  [address]    Culture  Setup Time   Final  GRAM POSITIVE COCCI IN CLUSTERS IN BOTH AEROBIC AND ANAEROBIC BOTTLES CRITICAL RESULT CALLED TO, READ BACK BY AND VERIFIED WITH: J. GRIMSLEY, PHARMD (WL) AT 2110 ON 09/30/18 BY C. JESSUP, MT. Performed at [facility], [place] [address], [place], [place] [address]    Culture METHICILLIN RESISTANT STAPHYLOCOCCUS AUREUS (A)  Final   Report Status 10/02/2018 FINAL  Final   Organism ID, Bacteria METHICILLIN RESISTANT STAPHYLOCOCCUS AUREUS  Final      Susceptibility   Methicillin resistant staphylococcus aureus - MIC*    CIPROFLOXACIN >=8 RESISTANT Resistant     ERYTHROMYCIN >=8 RESISTANT Resistant     GENTAMICIN <=0.5 SENSITIVE Sensitive     OXACILLIN >=4 RESISTANT Resistant     TETRACYCLINE <=1 SENSITIVE Sensitive     VANCOMYCIN 1 SENSITIVE Sensitive     TRIMETH/SULFA <=10 SENSITIVE Sensitive     CLINDAMYCIN <=0.25 SENSITIVE Sensitive     RIFAMPIN <=0.5 SENSITIVE Sensitive     Inducible Clindamycin NEGATIVE Sensitive     * METHICILLIN RESISTANT STAPHYLOCOCCUS AUREUS  Blood Culture ID Panel (Reflexed)     Status: Abnormal   Collection Time: 09/30/18  1:30 AM  Result Value Ref Range Status   Enterococcus species NOT DETECTED NOT DETECTED Final   Listeria monocytogenes NOT DETECTED NOT DETECTED Final   Staphylococcus species DETECTED (A) NOT DETECTED Final    Comment: CRITICAL RESULT CALLED TO, READ BACK BY AND VERIFIED WITH: J. GRIMSLEY, PHARMD (WL) AT 2110 ON 09/30/18 BY C. JESSUP, MT.    Staphylococcus aureus (BCID) DETECTED  (A) NOT DETECTED Final    Comment: Methicillin (oxacillin)-resistant Staphylococcus aureus (MRSA). MRSA is predictably resistant to beta-lactam antibiotics (except ceftaroline). Preferred therapy is vancomycin unless clinically contraindicated. Patient requires contact precautions if  hospitalized. CRITICAL RESULT CALLED TO, READ BACK BY AND VERIFIED WITH: J. GRIMSLEY, PHARMD (WL) AT 2110 ON 09/30/18 BY C. JESSUP, MT.    Methicillin resistance DETECTED (A) NOT DETECTED Final    Comment: CRITICAL RESULT CALLED TO, READ BACK BY AND VERIFIED WITH: J. GRIMSLEY, PHARMD (WL) AT 2110 ON 09/30/18 BY C. JESSUP, MT.    Streptococcus species NOT DETECTED NOT DETECTED Final   Streptococcus agalactiae NOT DETECTED NOT DETECTED Final   Streptococcus pneumoniae NOT DETECTED NOT DETECTED Final   Streptococcus pyogenes NOT DETECTED NOT DETECTED Final   Acinetobacter baumannii NOT DETECTED NOT DETECTED Final   Enterobacteriaceae species NOT DETECTED NOT DETECTED Final   Enterobacter cloacae complex NOT DETECTED NOT DETECTED Final   Escherichia coli NOT DETECTED NOT DETECTED Final   Klebsiella oxytoca NOT DETECTED NOT DETECTED Final   Klebsiella pneumoniae NOT DETECTED NOT DETECTED Final   Proteus species NOT DETECTED NOT DETECTED Final   Serratia marcescens NOT DETECTED NOT DETECTED Final   Haemophilus influenzae NOT DETECTED NOT DETECTED Final   Neisseria meningitidis NOT DETECTED NOT DETECTED Final   Pseudomonas aeruginosa NOT DETECTED NOT DETECTED Final   Candida albicans NOT DETECTED NOT DETECTED Final   Candida glabrata NOT DETECTED NOT DETECTED Final   Candida krusei NOT DETECTED NOT DETECTED Final   Candida parapsilosis NOT DETECTED NOT DETECTED Final   Candida tropicalis NOT DETECTED NOT DETECTED Final    Comment: Performed at [facility], [place]. [address], [place], [place] [address]  SARS Coronavirus 2 [facility] order, Performed in [facility] hospital lab) Nasopharyngeal Nasopharyngeal Swab      Status: None   Collection Time: 09/30/18  1:40 AM   Specimen: Nasopharyngeal Swab  Result Value Ref Range Status  SARS Coronavirus 2 NEGATIVE NEGATIVE Final    Comment: (NOTE) If result is NEGATIVE SARS-CoV-2 target nucleic acids are NOT DETECTED. The SARS-CoV-2 RNA is generally detectable in upper and lower  respiratory specimens during the acute phase of infection. The lowest  concentration of SARS-CoV-2 viral copies this assay can detect is 250  copies / mL. A negative result does not preclude SARS-CoV-2 infection  and should not be used as the sole basis for treatment or other  patient management decisions.  A negative result may occur with  improper specimen collection / handling, submission of specimen other  than nasopharyngeal swab, presence of viral mutation(s) within the  areas targeted by this assay, and inadequate number of viral copies  (<250 copies / mL). A negative result must be combined with clinical  observations, patient history, and epidemiological information. If result is POSITIVE SARS-CoV-2 target nucleic acids are DETECTED. The SARS-CoV-2 RNA is generally detectable in upper and lower  respiratory specimens dur ing the acute phase of infection.  Positive  results are indicative of active infection with SARS-CoV-2.  Clinical  correlation with patient history and other diagnostic information is  necessary to determine patient infection status.  Positive results do  not rule out bacterial infection or co-infection with other viruses. If result is PRESUMPTIVE POSTIVE SARS-CoV-2 nucleic acids MAY BE PRESENT.   A presumptive positive result was obtained on the submitted specimen  and confirmed on repeat testing.  While 2019 novel coronavirus  (SARS-CoV-2) nucleic acids may be present in the submitted sample  additional confirmatory testing may be necessary for epidemiological  and / or clinical management purposes  to differentiate between  SARS-CoV-2 and  other Sarbecovirus currently known to infect humans.  If clinically indicated additional testing with an alternate test  methodology [phone]) is advised. The SARS-CoV-2 RNA is generally  detectable in upper and lower respiratory sp ecimens during the acute  phase of infection. The expected result is Negative. Fact Sheet for Patients:  StrictlyIdeas.no Fact Sheet for Healthcare Providers: BankingDealers.co.za This test is not yet approved or cleared by the [place] FDA and has been authorized for detection and/or diagnosis of SARS-CoV-2 by FDA under an Emergency Use Authorization (EUA).  This EUA will remain in effect (meaning this test can be used) for the duration of the COVID-19 declaration under Section 564(b)(1) of the Act, 21 U.S.C. section 360bbb-3(b)(1), unless the authorization is terminated or revoked sooner. Performed at [facility], [place] [address] [place], [place] [address]   MRSA PCR Screening     Status: None   Collection Time: 09/30/18  4:01 AM   Specimen: Nasal Mucosa; Nasopharyngeal  Result Value Ref Range Status   MRSA by PCR NEGATIVE NEGATIVE Final    Comment:        The GeneXpert MRSA Assay (FDA approved for NASAL specimens only), is one component of a comprehensive MRSA colonization surveillance program. It is not intended to diagnose MRSA infection nor to guide or monitor treatment for MRSA infections. Performed at [facility], [place] [address] [place], [place] [address]   Surgical pcr screen     Status: None   Collection Time: 10/02/18  7:31 AM   Specimen: Nasal Mucosa; Nasal Swab  Result Value Ref Range Status   MRSA, PCR NEGATIVE NEGATIVE Final   Staphylococcus aureus NEGATIVE NEGATIVE Final    Comment: (NOTE) The Xpert SA Assay (FDA approved for NASAL specimens in patients 48 years of age and older), is one  component of a comprehensive surveillance  program. It is not intended to diagnose infection nor to guide or monitor treatment. Performed at [facility], [place] [address], [place], [place] [address]      Terri Piedra, [place] [facility] [phone] Pager  10/02/2018  10:06 AM

## 2018-10-03 ENCOUNTER — Encounter (HOSPITAL_COMMUNITY): Payer: Self-pay | Admitting: Vascular Surgery

## 2018-10-03 DIAGNOSIS — Z89512 Acquired absence of left leg below knee: Secondary | ICD-10-CM

## 2018-10-03 DIAGNOSIS — Z978 Presence of other specified devices: Secondary | ICD-10-CM

## 2018-10-03 LAB — GLUCOSE, CAPILLARY
Glucose-Capillary: 70 mg/dL (ref 70–99)
Glucose-Capillary: 67 mg/dL — ABNORMAL LOW (ref 70–99)
Glucose-Capillary: 78 mg/dL (ref 70–99)
Glucose-Capillary: 193 mg/dL — ABNORMAL HIGH (ref 70–99)
Glucose-Capillary: 133 mg/dL — ABNORMAL HIGH (ref 70–99)
Glucose-Capillary: 161 mg/dL — ABNORMAL HIGH (ref 70–99)

## 2018-10-03 LAB — BASIC METABOLIC PANEL
Anion gap: 8 (ref 5–15)
BUN: 23 mg/dL — ABNORMAL HIGH (ref 6–20)
CO2: 18 mmol/L — ABNORMAL LOW (ref 22–32)
Calcium: 7.8 mg/dL — ABNORMAL LOW (ref 8.9–10.3)
Chloride: 109 mmol/L (ref 98–111)
Creatinine, Ser: 1.73 mg/dL — ABNORMAL HIGH (ref 0.61–1.24)
GFR calc Af Amer: 50 mL/min — ABNORMAL LOW (ref >60)
GFR calc non Af Amer: 43 mL/min — ABNORMAL LOW (ref >60)
Glucose, Bld: 119 mg/dL — ABNORMAL HIGH (ref 70–99)
Potassium: 4.6 mmol/L (ref 3.5–5.1)
Sodium: 135 mmol/L (ref 135–145)

## 2018-10-03 LAB — CBC
HCT: 26 % — ABNORMAL LOW (ref 39.0–52.0)
Hemoglobin: 8 g/dL — ABNORMAL LOW (ref 13.0–17.0)
MCH: 22.5 pg — ABNORMAL LOW (ref 26.0–34.0)
MCHC: 30.8 g/dL (ref 30.0–36.0)
MCV: 73 fL — ABNORMAL LOW (ref 80.0–100.0)
Platelets: 392 10*3/uL (ref 150–400)
RBC: 3.56 MIL/uL — ABNORMAL LOW (ref 4.22–5.81)
RDW: 21.5 % — ABNORMAL HIGH (ref 11.5–15.5)
WBC: 13.3 10*3/uL — ABNORMAL HIGH (ref 4.0–10.5)
nRBC: 0 % (ref 0.0–0.2)

## 2018-10-03 LAB — HEPARIN LEVEL (UNFRACTIONATED)
Heparin Unfractionated: <0.1 IU/mL — ABNORMAL LOW (ref 0.30–0.70)
Heparin Unfractionated: <0.1 IU/mL — ABNORMAL LOW (ref 0.30–0.70)

## 2018-10-03 MED ORDER — MORPHINE SULFATE (PF) 4 MG/ML IV SOLN
3.0000 mg | INTRAVENOUS | Status: DC | PRN
  Administered 2018-10-03 – 2018-10-07 (×9): 4 mg via INTRAVENOUS
  Administered 2018-10-08: 05:00:00 1 mg via INTRAVENOUS
  Administered 2018-10-08: 5 mg via INTRAVENOUS
  Administered 2018-10-08: 4 mg via INTRAVENOUS
  Filled 2018-10-03 (×2): qty 1
  Filled 2018-10-03: qty 2
  Filled 2018-10-03 (×2): qty 1
  Filled 2018-10-03: qty 2
  Filled 2018-10-03 (×9): qty 1

## 2018-10-03 MED ORDER — ZOLPIDEM TARTRATE 5 MG PO TABS
5.0000 mg | ORAL_TABLET | Freq: Once | ORAL | Status: AC
  Administered 2018-10-03: 5 mg via ORAL
  Filled 2018-10-03: qty 1

## 2018-10-03 NOTE — Progress Notes (Signed)
PROGRESS NOTE  Daniel Moon [id number] DOB: 08-19-1960 DOA: [id number] PCP: Sanjuana Letters, MD  HPI/Recap of past 24 hours: Daniel Moon a 58 y.o.malewith medical history significant forDM, peripheral neuropathy, A. Fib-paroxysmal not on anticoagulation,, nonhealing left transmetatarsal amputation wound and limb threatening ischemia being admitted with sepsis likely due to osteomyelitis. The patient is a poor historian but tells me he started having fevers today at home. He says he was feeling fine until today when he says his family heard him wake up yelling in his sleep. He was found to have a fever and his family called EMS. He last had balloon angioplasty in July and says he was given antibiotics after that procedure but didn't take them since they made him feel sick. Since that time, he has not seen a physician.In ED he was found to be febrile, have a leukocytosis and XR of foot shows evidence of osteomyelitis. He was given IVF but gently due to his history of CHF, and his blood pressure is stable. He was given empiric vancomycin and zosyn.   Assessment/Plan: Principal Problem:   MRSA bacteremia Active Problems:   HTN (hypertension)   Diabetes ([place])   HLD (hyperlipidemia)   COPD (chronic obstructive pulmonary disease) (HCC)   Sepsis (HCC)   Open wound of left foot   Osteomyelitis (HCC)   Microcytic anemia   PAD (peripheral artery disease) (HCC)   Peripheral neuropathy   Cigarette smoker   S/P transmetatarsal amputation of foot, left (HCC)   Pressure injury of skin   Chronic renal insufficiency, stage 3 (moderate) (HCC)   Paroxysmal atrial fibrillation (HCC)   Unintentional weight loss   Poor dentition    Sepsis secondary to left lower extremity osteomyelitis/MRSA bacteremia, POA -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 -tolerated well -Pain ongoing, poorly controlled per patient, declining Dilaudid, requesting morphine, will defer to surgical specialist;  might be reasonable to initiate PCA pump -Leukocytosis downtrending appropriately -Continue  IV vancomycin and IV Zosyn empirically ending cultures - Blood cultures drawn on 09/30/2018 +4 MRSA 2 out of 2 bottles -resistant to Cipro, erythromycin, oxacillin per preliminary report -2D echo ordered and remarkable for LV thrombus as below; without overt valvular abnormalities -Repeat blood cultures on 9/15 and 9/16 pending to assist with antibiotic duration -Hold off PICC line until blood cultures negative for at least 48 hours/remains afebrile  Left ventricular thrombus in the setting of paroxysmal A. fib and medication noncompliance Will likely need ongoing anticoagulation postoperatively -patient denies compliance with previous anticoagulation Currently on heparin drip -likely transition to p.o. anticoagulation pending medication cost review to improve compliance  AKI on CKD 2, ongoing Baseline creatinine appears to be 1.4 with GFR greater than 60 Creatinine 1.7 Continue IV fluids perioperatively -poor p.o. intake today  Hyperkalemia,poa, resolved Not on potassium supplement Potassium 5.4 previously received calcium gluconate; EKG unremarkable Follow with morning labs  Iron deficiency anemia/anemia of chronic disease Presented with hemoglobin of 6.9 -stable around 8 posttransfusion Transfuse 1 unit PRBC on 09/30/2018 Iron studies done on 09/30/2018 significant for iron deficiency Hold off IV iron supplement in the setting of active infective process Start p.o. ferrous sulfate 325 daily  Peripheral vascular disease ABI ordered and pending Hold off Plavix given need for full dose anticoagulation, could consider 81 aspirin at discharge  Chronic systolic CHF, EF 0000000, not in acute exacerbation Reported noncompliant with his medications Seen at [facility] remarkable for 2.6 x 2 cm LV thrombus Strict I's and O's, daily weights  Hyperglycemia,  possibly stress-induced Obtain  hemoglobin A1c Start sensitive insulin sliding scale  Medication noncompliance Lengthy discussion at bedside about need for medication compliance Patient educated on increased risk of morbidity mortality should he continue to be noncompliant with medications and medical therapy  DVT prophylaxis: Currently on heparin drip -initiated 10/02/2018 Code Status: Full code Family Communication: None at bedside Disposition Plan: Pending surgical evaluation and treatment, as well as ongoing antibiotic needs. Likely disposition home with home health versus SNF pending PT recommendations and possible ongoing need for IV antibiotics  Consultants:  Vascular surgery  Procedures:  Left BKA 10/02/2018  Antimicrobials: IV vancomycin and IV Zosyn     Objective: Vitals:   10/02/18 2158 10/03/18 0241 10/03/18 0545 10/03/18 0651  BP: 104/68 120/88 121/88   Pulse: 78 88 87 85  Resp: 16 16 16    Temp: 97.7 F (36.5 C) 98.6 F (37 C) (!) 97.4 F (36.3 C)   TempSrc: Oral Oral Oral   SpO2: 100% 95% 95% 98%  Weight:      Height:        Intake/Output Summary (Last 24 hours) at 10/03/2018 0748 Last data filed at 10/03/2018 0653 Gross per 24 hour  Intake 1659.32 ml  Output 200 ml  Net 1459.32 ml   Filed Weights   09/30/18 0330 09/30/18 0606 10/01/18 1930  Weight: 75.6 kg 75.6 kg 81.9 kg    Exam:  General:  Pleasantly resting in bed, No acute distress. HEENT:  Normocephalic atraumatic.  Sclerae nonicteric, noninjected.  Extraocular movements intact bilaterally. Neck:  Without mass or deformity.  Trachea is midline. Lungs:  Clear to auscultate bilaterally without rhonchi, wheeze, or rales. Heart:  Regular rate and rhythm.  Without murmurs, rubs, or gallops. Abdomen:  Soft, nontender, nondistended.  Without guarding or rebound. Extremities: Left lower extremity BKA with bandage clean dry intact, wound VAC intact without notable leak.  Data Reviewed: CBC: Recent Labs  Lab 09/30/18 0054  10/01/18 0236  WBC 25.0* 15.8*  NEUTROABS 22.2* 11.7*  HGB 6.9* 8.2*  HCT 22.0* 26.2*  MCV 68.8* 70.8*  PLT 399 [id number]   Basic Metabolic Panel: Recent Labs  Lab 09/30/18 0054 10/01/18 0236  NA 132* 134*  K 4.6 5.4*  CL 103 102  CO2 22 21*  GLUCOSE 111* 157*  BUN 24* 27*  CREATININE 1.42* 1.78*  CALCIUM 7.8* 7.6*   GFR: Estimated Creatinine Clearance: 53 mL/min (A) (by C-G formula based on SCr of 1.78 mg/dL (H)). Liver Function Tests: Recent Labs  Lab 09/30/18 0054  AST 12*  ALT 8  ALKPHOS 107  BILITOT 1.1  PROT 8.7*  ALBUMIN 1.9*   Coagulation Profile: Recent Labs  Lab 09/30/18 0054  INR 1.4*   HbA1C: No results for input(s): HGBA1C in the last 72 hours. CBG: Recent Labs  Lab 10/02/18 0825 10/02/18 1159 10/02/18 1631 10/02/18 1717 10/02/18 2148  GLUCAP 102* 109* 99 92 170*   Lipid Profile: No results for input(s): CHOL, HDL, LDLCALC, TRIG, CHOLHDL, LDLDIRECT in the last 72 hours. Thyroid Function Tests: No results for input(s): TSH, T4TOTAL, FREET4, T3FREE, THYROIDAB in the last 72 hours. Anemia Panel: No results for input(s): VITAMINB12, FOLATE, FERRITIN, TIBC, IRON, RETICCTPCT in the last 72 hours. Urine analysis:    Component Value Date/Time   COLORURINE AMBER (A) 09/30/2018 0051   APPEARANCEUR CLOUDY (A) 09/30/2018 0051   LABSPEC 1.024 09/30/2018 0051   PHURINE 5.0 09/30/2018 0051   GLUCOSEU NEGATIVE 09/30/2018 0051   HGBUR NEGATIVE 09/30/2018 0051   BILIRUBINUR  NEGATIVE 09/30/2018 [place] 09/30/2018 0051   PROTEINUR 100 (A) 09/30/2018 0051   NITRITE NEGATIVE 09/30/2018 0051   LEUKOCYTESUR NEGATIVE 09/30/2018 0051   Sepsis Labs: @LABRCNTIP (procalcitonin:4,lacticidven:4)  ) Recent Results (from the past 240 hour(s))  Culture, blood (Routine x 2)     Status: Abnormal   Collection Time: 09/30/18 12:54 AM   Specimen: BLOOD  Result Value Ref Range Status   Specimen Description   Final    BLOOD LEFT ANTECUBITAL  Performed at [facility], [place] [address] [place], [place] [address]    Special Requests   Final    BOTTLES DRAWN AEROBIC AND ANAEROBIC Blood Culture results may not be optimal due to an inadequate volume of blood received in culture bottles Performed at [place] [address] [place], [place] [address]    Culture  Setup Time   Final    GRAM POSITIVE COCCI IN CLUSTERS IN BOTH AEROBIC AND ANAEROBIC BOTTLES CRITICAL RESULT CALLED TO, READ BACK BY AND VERIFIED WITH: J. GRIMSLEY, PHARMD (WL) AT 2110 ON 09/30/18 BY C. JESSUP, MT.    Culture (A)  Final    STAPHYLOCOCCUS AUREUS SUSCEPTIBILITIES PERFORMED ON PREVIOUS CULTURE WITHIN THE LAST 5 DAYS. Performed at [facility], [place] [address] [place], [place] [address]    Report Status 10/02/2018 FINAL  Final  Culture, blood (Routine x 2)     Status: Abnormal   Collection Time: 09/30/18  1:30 AM   Specimen: BLOOD RIGHT FOREARM  Result Value Ref Range Status   Specimen Description   Final    BLOOD RIGHT FOREARM Performed at [place] [address], [place], [place] [address]    Special Requests   Final    BOTTLES DRAWN AEROBIC AND ANAEROBIC Blood Culture adequate volume Performed at [place] [address] [place], [place] [address]    Culture  Setup Time   Final    GRAM POSITIVE COCCI IN CLUSTERS IN BOTH AEROBIC AND ANAEROBIC BOTTLES CRITICAL RESULT CALLED TO, READ BACK BY AND VERIFIED WITH: Lavell [place], PHARMD (WL) AT 2110 ON 09/30/18 BY C. JESSUP, MT. Performed at [facility], [place] [address], [place], [place] [address]    Culture METHICILLIN RESISTANT STAPHYLOCOCCUS AUREUS (A)  Final   Report Status 10/02/2018 FINAL  Final   Organism ID, Bacteria METHICILLIN RESISTANT STAPHYLOCOCCUS AUREUS  Final      Susceptibility   Methicillin resistant staphylococcus aureus - MIC*    CIPROFLOXACIN >=8 RESISTANT Resistant     ERYTHROMYCIN >=8  RESISTANT Resistant     GENTAMICIN <=0.5 SENSITIVE Sensitive     OXACILLIN >=4 RESISTANT Resistant     TETRACYCLINE <=1 SENSITIVE Sensitive     VANCOMYCIN 1 SENSITIVE Sensitive     TRIMETH/SULFA <=10 SENSITIVE Sensitive     CLINDAMYCIN <=0.25 SENSITIVE Sensitive     RIFAMPIN <=0.5 SENSITIVE Sensitive     Inducible Clindamycin NEGATIVE Sensitive     * METHICILLIN RESISTANT STAPHYLOCOCCUS AUREUS  Blood Culture ID Panel (Reflexed)     Status: Abnormal   Collection Time: 09/30/18  1:30 AM  Result Value Ref Range Status   Enterococcus species NOT DETECTED NOT DETECTED Final   Listeria monocytogenes NOT DETECTED NOT DETECTED Final   Staphylococcus species DETECTED (A) NOT DETECTED Final    Comment: CRITICAL RESULT CALLED TO, READ BACK BY AND VERIFIED WITH: J. GRIMSLEY, PHARMD (WL) AT 2110 ON 09/30/18 BY C. JESSUP, MT.  Staphylococcus aureus (BCID) DETECTED (A) NOT DETECTED Final    Comment: Methicillin (oxacillin)-resistant Staphylococcus aureus (MRSA). MRSA is predictably resistant to beta-lactam antibiotics (except ceftaroline). Preferred therapy is vancomycin unless clinically contraindicated. Patient requires contact precautions if  hospitalized. CRITICAL RESULT CALLED TO, READ BACK BY AND VERIFIED WITH: J. GRIMSLEY, PHARMD (WL) AT 2110 ON 09/30/18 BY C. JESSUP, MT.    Methicillin resistance DETECTED (A) NOT DETECTED Final    Comment: CRITICAL RESULT CALLED TO, READ BACK BY AND VERIFIED WITH: J. GRIMSLEY, PHARMD (WL) AT 2110 ON 09/30/18 BY C. JESSUP, MT.    Streptococcus species NOT DETECTED NOT DETECTED Final   Streptococcus agalactiae NOT DETECTED NOT DETECTED Final   Streptococcus pneumoniae NOT DETECTED NOT DETECTED Final   Streptococcus pyogenes NOT DETECTED NOT DETECTED Final   Acinetobacter baumannii NOT DETECTED NOT DETECTED Final   Enterobacteriaceae species NOT DETECTED NOT DETECTED Final   Enterobacter cloacae complex NOT DETECTED NOT DETECTED Final   Escherichia coli NOT  DETECTED NOT DETECTED Final   Klebsiella oxytoca NOT DETECTED NOT DETECTED Final   Klebsiella pneumoniae NOT DETECTED NOT DETECTED Final   Proteus species NOT DETECTED NOT DETECTED Final   Serratia marcescens NOT DETECTED NOT DETECTED Final   Haemophilus influenzae NOT DETECTED NOT DETECTED Final   Neisseria meningitidis NOT DETECTED NOT DETECTED Final   Pseudomonas aeruginosa NOT DETECTED NOT DETECTED Final   Candida albicans NOT DETECTED NOT DETECTED Final   Candida glabrata NOT DETECTED NOT DETECTED Final   Candida krusei NOT DETECTED NOT DETECTED Final   Candida parapsilosis NOT DETECTED NOT DETECTED Final   Candida tropicalis NOT DETECTED NOT DETECTED Final    Comment: Performed at [facility], [place] [address], [place], [place] [address]  SARS Coronavirus 2 [facility] order, Performed in [facility] hospital lab) Nasopharyngeal Nasopharyngeal Swab     Status: None   Collection Time: 09/30/18  1:40 AM   Specimen: Nasopharyngeal Swab  Result Value Ref Range Status   SARS Coronavirus 2 NEGATIVE NEGATIVE Final    Comment: (NOTE) If result is NEGATIVE SARS-CoV-2 target nucleic acids are NOT DETECTED. The SARS-CoV-2 RNA is generally detectable in upper and lower  respiratory specimens during the acute phase of infection. The lowest  concentration of SARS-CoV-2 viral copies this assay can detect is 250  copies / mL. A negative result does not preclude SARS-CoV-2 infection  and should not be used as the sole basis for treatment or other  patient management decisions.  A negative result may occur with  improper specimen collection / handling, submission of specimen other  than nasopharyngeal swab, presence of viral mutation(s) within the  areas targeted by this assay, and inadequate number of viral copies  (<250 copies / mL). A negative result must be combined with clinical  observations, patient history, and epidemiological information. If result is POSITIVE SARS-CoV-2 target  nucleic acids are DETECTED. The SARS-CoV-2 RNA is generally detectable in upper and lower  respiratory specimens dur ing the acute phase of infection.  Positive  results are indicative of active infection with SARS-CoV-2.  Clinical  correlation with patient history and other diagnostic information is  necessary to determine patient infection status.  Positive results do  not rule out bacterial infection or co-infection with other viruses. If result is PRESUMPTIVE POSTIVE SARS-CoV-2 nucleic acids MAY BE PRESENT.   A presumptive positive result was obtained on the submitted specimen  and confirmed on repeat testing.  While 2019 novel coronavirus  (SARS-CoV-2) nucleic acids may be  present in the submitted sample  additional confirmatory testing may be necessary for epidemiological  and / or clinical management purposes  to differentiate between  SARS-CoV-2 and other Sarbecovirus currently known to infect humans.  If clinically indicated additional testing with an alternate test  methodology [phone]) is advised. The SARS-CoV-2 RNA is generally  detectable in upper and lower respiratory sp ecimens during the acute  phase of infection. The expected result is Negative. Fact Sheet for Patients:  StrictlyIdeas.no Fact Sheet for Healthcare Providers: BankingDealers.co.za This test is not yet approved or cleared by the [place] FDA and has been authorized for detection and/or diagnosis of SARS-CoV-2 by FDA under an Emergency Use Authorization (EUA).  This EUA will remain in effect (meaning this test can be used) for the duration of the COVID-19 declaration under Section 564(b)(1) of the Act, 21 U.S.C. section 360bbb-3(b)(1), unless the authorization is terminated or revoked sooner. Performed at [facility], [place] [address], [place], [place] [address]   MRSA PCR Screening     Status: None   Collection Time: 09/30/18  4:01  AM   Specimen: Nasal Mucosa; Nasopharyngeal  Result Value Ref Range Status   MRSA by PCR NEGATIVE NEGATIVE Final    Comment:        The GeneXpert MRSA Assay (FDA approved for NASAL specimens only), is one component of a comprehensive MRSA colonization surveillance program. It is not intended to diagnose MRSA infection nor to guide or monitor treatment for MRSA infections. Performed at [facility], [place] [address], [place], [place] [address]   Surgical pcr screen     Status: None   Collection Time: 10/02/18  7:31 AM   Specimen: Nasal Mucosa; Nasal Swab  Result Value Ref Range Status   MRSA, PCR NEGATIVE NEGATIVE Final   Staphylococcus aureus NEGATIVE NEGATIVE Final    Comment: (NOTE) The Xpert SA Assay (FDA approved for NASAL specimens in patients 58 years of age and older), is one component of a comprehensive surveillance program. It is not intended to diagnose infection nor to guide or monitor treatment. Performed at [facility], [place] [address], [place], [place] [address]     Studies: No results found.  Scheduled Meds: . amLODipine  10 mg Oral Daily  . atorvastatin  40 mg Oral QHS  . carvedilol  12.5 mg Oral BID  . Chlorhexidine Gluconate Cloth  6 each Topical Daily  . ferrous sulfate  325 mg Oral Q breakfast  . gabapentin  300 mg Oral QHS  . insulin aspart  0-15 Units Subcutaneous TID WC  . insulin NPH Human  10 Units Subcutaneous BID AC & HS  . nicotine  21 mg Transdermal Daily  . polyethylene glycol  17 g Oral Daily  . senna-docusate  2 tablet Oral BID  . sodium bicarbonate  650 mg Oral BID    Continuous Infusions: . sodium chloride 50 mL/hr at 10/03/18 0653  . sodium chloride    . heparin 2,000 Units/hr (10/03/18 [id number])  . vancomycin Stopped (10/03/18 0057)     LOS: 3 days   Time spent: greater than 35 minutes  Little Ishikawa, DO [facility] Pager [phone]  If 7PM-7AM, please contact night-coverage  [email or web address] Password [facility] 10/03/2018, 7:48 AM

## 2018-10-03 NOTE — Progress Notes (Signed)
Inpatient Diabetes Program Recommendations  AACE/ADA: New Consensus Statement on Inpatient Glycemic Control (2015)  Target Ranges:  Prepandial:   less than 140 mg/dL      Peak postprandial:   less than 180 mg/dL (1-2 hours)      Critically ill patients:  140 - 180 mg/dL   Lab Results  Component Value Date   GLUCAP 193 (H) 10/03/2018   HGBA1C 8.4 (H) 09/30/2018    Review of Glycemic Control Results for Daniel Moon, Daniel Moon (MRN [id number]) as of 10/03/2018 12:51  Ref. Range 10/02/2018 08:25 10/02/2018 11:59 10/02/2018 16:31 10/02/2018 17:17 10/02/2018 21:48 10/03/2018 08:12 10/03/2018 08:36 10/03/2018 08:47 10/03/2018 11:47  Glucose-Capillary Latest Ref Range: 70 - 99 mg/dL 102 (H) 109 (H) 99 92 170 (H) 70 67 (L) 78 193 (H)   Diabetes history: DM Outpatient Diabetes medications: Humulin N 10 units am & hs + Humulin R 2 units tid meal coverage Current orders for Inpatient glycemic control: N insulin 10 units am + hs + Novolog moderate correction tid  Inpatient Diabetes Program Recommendations:   While in the hospital, please consider changes: -D/C NPH insulin -Lantus 10 units daily -Novolog 2 units tid meal coverage if eats 50% -Decrease Novolog correction to sensitive tid Recommendations secure chat to Dr. Avon Gully.  Thank you, Nani Gasser. Jeovany Huitron, RN, MSN, CDE  Diabetes Coordinator Inpatient Glycemic Control Team Team Pager [phone] (8am-5pm) 10/03/2018 12:50 PM

## 2018-10-03 NOTE — Progress Notes (Signed)
Patient verbally abusive to staff, this writer is offering pain med so we can manage his pain better but patient asked me what pain med I have and when I told him oxycodone, patient started cursing saying the 'F' word and the "S" word. This writer told the patient not to talk to me that way for I am here to take care of him and he stated that he can say anything that he wants to say. Charge Nurse made aware.

## 2018-10-03 NOTE — Progress Notes (Signed)
PT Cancellation Note  Patient Details Name: Daniel Moon MRN: [id number] DOB: 11/18/1960   Cancelled Treatment:    Reason Eval/Treat Not Completed: Medical issues which prohibited therapy patient with LV thrombus, most recent heparin level out of therapeutic range. Will continue to monitor and will attempt to see patient if heparin reaches level required for safe mobility and if time/schedule allow.    Deniece Ree PT, DPT, CBIS  Supplemental Physical Therapist [facility]    Pager [phone] Acute Rehab Office [phone]

## 2018-10-03 NOTE — Progress Notes (Signed)
   VASCULAR SURGERY ASSESSMENT & PLAN:   POD 1 Left BKA: Dressing is dry.  He will be due for dressing change tomorrow.  ANTICOAGULATION: He is back on heparin for his ventricular thrombus.  ID: He is on Zosyn and vancomycin.  I would continue this for 48 hours.  SUBJECTIVE:   Moderate discomfort.  PHYSICAL EXAM:   Vitals:   10/02/18 1708 10/02/18 2158 10/03/18 0241 10/03/18 0545  BP: 116/79 104/68 120/88 121/88  Pulse: 78 78 88 87  Resp: 15 16 16 16   Temp: 97.8 F (36.6 C) 97.7 F (36.5 C) 98.6 F (37 C) (!) 97.4 F (36.3 C)  TempSrc: Oral Oral Oral Oral  SpO2: 97% 100% 95% 95%  Weight:      Height:       His left BKA dressing is dry.  LABS:   Lab Results  Component Value Date   WBC 15.8 (H) 10/01/2018   HGB 8.2 (L) 10/01/2018   HCT 26.2 (L) 10/01/2018   MCV 70.8 (L) 10/01/2018   PLT 386 10/01/2018   Lab Results  Component Value Date   CREATININE 1.78 (H) 10/01/2018   Lab Results  Component Value Date   INR 1.4 (H) 09/30/2018   CBG (last 3)  Recent Labs    10/02/18 1631 10/02/18 1717 10/02/18 2148  GLUCAP 99 92 170*    PROBLEM LIST:    Principal Problem:   MRSA bacteremia Active Problems:   HTN (hypertension)   Diabetes ([place])   HLD (hyperlipidemia)   COPD (chronic obstructive pulmonary disease) (HCC)   Sepsis ([employer])   Open wound of left foot   Osteomyelitis (HCC)   Microcytic anemia   PAD (peripheral artery disease) (HCC)   Peripheral neuropathy   Cigarette smoker   S/P transmetatarsal amputation of foot, left (HCC)   Pressure injury of skin   Chronic renal insufficiency, stage 3 (moderate) (HCC)   Paroxysmal atrial fibrillation (HCC)   Unintentional weight loss   Poor dentition   CURRENT MEDS:   . amLODipine  10 mg Oral Daily  . atorvastatin  40 mg Oral QHS  . carvedilol  12.5 mg Oral BID  . Chlorhexidine Gluconate Cloth  6 each Topical Daily  . ferrous sulfate  325 mg Oral Q breakfast  . gabapentin  300 mg Oral QHS  .  insulin aspart  0-15 Units Subcutaneous TID WC  . insulin NPH Human  10 Units Subcutaneous BID AC & HS  . nicotine  21 mg Transdermal Daily  . polyethylene glycol  17 g Oral Daily  . senna-docusate  2 tablet Oral BID  . sodium bicarbonate  650 mg Oral BID    Daniel Moon Beeper: [id number] Office: [phone] 10/03/2018

## 2018-10-03 NOTE — Progress Notes (Signed)
ANTICOAGULATION CONSULT NOTE - Follow Up Consult  Pharmacy Consult for Heparin Indication: LV thrombus  Allergies  Allergen Reactions  . Amoxicillin-Pot Clavulanate Nausea And Vomiting    Did it involve swelling of the face/tongue/throat, SOB, or low BP? No Did it involve sudden or severe rash/hives, skin peeling, or any reaction on the inside of your mouth or nose? No Did you need to seek medical attention at a hospital or doctor's office? No When did it last happen?6 months ago If all above answers are "NO", may proceed with cephalosporin use.    Patient Measurements: Height: 6\' 3"  (190.5 cm) Weight: 180 lb 8.9 oz (81.9 kg) IBW/kg (Calculated) : 84.5 Heparin Dosing Weight: 81.9 kg  Vital Signs: Temp: 97.4 F (36.3 C) (09/16 0545) Temp Source: Oral (09/16 0545) BP: 121/88 (09/16 0545) Pulse Rate: 85 (09/16 0651)  Labs: Recent Labs    10/01/18 0236 10/02/18 0441 10/03/18 0913  HGB 8.2*  --  8.0*  HCT 26.2*  --  26.0*  PLT 386  --  392  HEPARINUNFRC  --  <0.10* <0.10*  CREATININE 1.78*  --  1.73*    Estimated Creatinine Clearance: 54.6 mL/min (A) (by C-G formula based on SCr of 1.73 mg/dL (H)).  Assessment:  58 yr old male started on IV heparin for LV thrombus last night.    Initial heparin level was undetectable (<0.10) on 900 unit/hr;  heparin 2000 units IV bolus given and infusion rate increased to 1050 units/hr ~6am today. Then heparin held for OR this afternoon.  S/p left BKA, IV heparin  resumed at midnight, ~ 8 hours post-op.  Heparin level this AM undetectable <0.1. No issues with drip running at 10.33ml/hr per RN. Will have to increase the drip. Will not bolus due to recent BKA.   Goal of Therapy:  Heparin level 0.3-0.7 units/ml Monitor platelets by anticoagulation protocol: Yes   Plan:   Increase heparin drip to 1350 units/hr, no bolus.  Heparin level 6 hrs after rate increase.  Daily heparin level and CBC.  Tauheed Mcfayden A. Levada Dy, PharmD, BCPS,  FNKF Clinical Pharmacist [place] Please utilize Amion for appropriate phone number to reach the unit pharmacist ([place])   10/03/2018,10:41 AM

## 2018-10-03 NOTE — Progress Notes (Signed)
[facility] for Infectious Disease  Date of Admission:  09/30/2018     Total days of antibiotics 5         ASSESSMENT  Mr. 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 Primary source of infection was likely left foot with source control now achieved. Repeat blood cultures are without growth in <24 hours. Will order TEE to rule out endocarditis and will likely need CVTS evaluation. Will need prolonged duration of therapy depending upon TEE results. Creatinine appears stable at present with no evidence of nephrotoxicity. May need to consider central line placement vs. PICC. After speaking with Daniel Moon today I do have concerns about his willingness to complete an at home IV therapy regimen. Will continue to monitor.   PLAN:  1. Continue vancomycin. 2. Monitor repeat cultures for clearance of bacteremia.  3. Hold on PICC / Central line until blood cultures remain clear for at least 48-72 hours.  4. Monitor renal function for nephrotoxicity while on vancomycin.     Principal Problem:   MRSA bacteremia Active Problems:   HTN (hypertension)   Diabetes ([place])   HLD (hyperlipidemia)   COPD (chronic obstructive pulmonary disease) (HCC)   Sepsis (HCC)   Open wound of left foot   Osteomyelitis (HCC)   Microcytic anemia   PAD (peripheral artery disease) (HCC)   Peripheral neuropathy   Cigarette smoker   S/P transmetatarsal amputation of foot, left (HCC)   Pressure injury of skin   Chronic renal insufficiency, stage 3 (moderate) (HCC)   Paroxysmal atrial fibrillation (HCC)   Unintentional weight loss   Poor dentition   . amLODipine  10 mg Oral Daily  . atorvastatin  40 mg Oral QHS  . carvedilol  12.5 mg Oral BID  . Chlorhexidine Gluconate Cloth  6 each Topical Daily  . ferrous sulfate  325 mg Oral Q breakfast  . gabapentin  300 mg Oral QHS  . insulin aspart  0-15 Units Subcutaneous TID WC   . insulin NPH Human  10 Units Subcutaneous BID AC & HS  . nicotine  21 mg Transdermal Daily  . polyethylene glycol  17 g Oral Daily  . senna-docusate  2 tablet Oral BID    SUBJECTIVE:  Afebrile overnight with improving leukocytosis. No acute events. Having pain in his leg. Wanting to go home.   Allergies  Allergen Reactions  . Amoxicillin-Pot Clavulanate Nausea And Vomiting    Did it involve swelling of the face/tongue/throat, SOB, or low BP? No Did it involve sudden or severe rash/hives, skin peeling, or any reaction on the inside of your mouth or nose? No Did you need to seek medical attention at a hospital or doctor's office? No When did it last happen?6 months ago If all above answers are "NO", may proceed with cephalosporin use.     Review of Systems: Review of Systems  Constitutional: Negative for chills, fever and weight loss.  Respiratory: Negative for cough, shortness of breath and wheezing.   Cardiovascular: Negative for chest pain and leg swelling.  Gastrointestinal: Negative for abdominal pain, constipation, diarrhea, nausea and vomiting.  Musculoskeletal:       Positive for left lower extremity pain.  Skin: Negative for rash.      OBJECTIVE: Vitals:   10/02/18 2158 10/03/18 0241 10/03/18 0545 10/03/18 0651  BP: 104/68 120/88 121/88   Pulse: 78 88 87 85  Resp: 16 16  16   Temp: 97.7 F (36.5 C) 98.6 F (37 C) (!) 97.4 F (36.3 C)   TempSrc: Oral Oral Oral   SpO2: 100% 95% 95% 98%  Weight:      Height:       Body mass index is 22.57 kg/m.  Physical Exam Constitutional:      General: He is not in acute distress.    Appearance: He is well-developed.     Comments: Lying in bed with head of bed elevated; disgruntled.   Cardiovascular:     Rate and Rhythm: Normal rate and regular rhythm.     Heart sounds: Normal heart sounds.  Pulmonary:     Effort: Pulmonary effort is normal.     Breath sounds: Normal breath sounds.  Musculoskeletal:      Comments: Left BKA dressing in place. JP drain to suction with bloody drainage.   Skin:    General: Skin is warm and dry.  Neurological:     Mental Status: He is alert.     Lab Results Lab Results  Component Value Date   WBC 13.3 (H) 10/03/2018   HGB 8.0 (L) 10/03/2018   HCT 26.0 (L) 10/03/2018   MCV 73.0 (L) 10/03/2018   PLT 392 10/03/2018    Lab Results  Component Value Date   CREATININE 1.73 (H) 10/03/2018   BUN 23 (H) 10/03/2018   NA 135 10/03/2018   K 4.6 10/03/2018   CL 109 10/03/2018   CO2 18 (L) 10/03/2018    Lab Results  Component Value Date   ALT 8 09/30/2018   AST 12 (L) 09/30/2018   ALKPHOS 107 09/30/2018   BILITOT 1.1 09/30/2018     Microbiology: Recent Results (from the past 240 hour(s))  Culture, blood (Routine x 2)     Status: Abnormal   Collection Time: 09/30/18 12:54 AM   Specimen: BLOOD  Result Value Ref Range Status   Specimen Description   Final    BLOOD LEFT ANTECUBITAL Performed at [facility], [place] [address], [place], [place] [address]    Special Requests   Final    BOTTLES DRAWN AEROBIC AND ANAEROBIC Blood Culture results may not be optimal due to an inadequate volume of blood received in culture bottles Performed at [facility] [address], [place], [place] [address]    Culture  Setup Time   Final    GRAM POSITIVE COCCI IN CLUSTERS IN BOTH AEROBIC AND ANAEROBIC BOTTLES CRITICAL RESULT CALLED TO, READ BACK BY AND VERIFIED WITH: J. GRIMSLEY, PHARMD (WL) AT 2110 ON 09/30/18 BY C. JESSUP, MT.    Culture (A)  Final    STAPHYLOCOCCUS AUREUS SUSCEPTIBILITIES PERFORMED ON PREVIOUS CULTURE WITHIN THE LAST 5 DAYS. Performed at [facility], [place] [address], [place], [place] [address]    Report Status 10/02/2018 FINAL  Final  Culture, blood (Routine x 2)     Status: Abnormal   Collection Time: 09/30/18  1:30 AM   Specimen: BLOOD RIGHT FOREARM  Result Value Ref Range Status   Specimen  Description   Final    BLOOD RIGHT FOREARM Performed at [place] [address], [place], [place] [address]    Special Requests   Final    BOTTLES DRAWN AEROBIC AND ANAEROBIC Blood Culture adequate volume Performed at [facility] [address], [place], [place] [address]    Culture  Setup Time   Final    GRAM POSITIVE COCCI IN  CLUSTERS IN BOTH AEROBIC AND ANAEROBIC BOTTLES CRITICAL RESULT CALLED TO, READ BACK BY AND VERIFIED WITH: J. GRIMSLEY, PHARMD (WL) AT 2110 ON 09/30/18 BY C. JESSUP, MT. Performed at [facility], [place] [address] [place], [place] [address]    Culture METHICILLIN RESISTANT STAPHYLOCOCCUS AUREUS (A)  Final   Report Status 10/02/2018 FINAL  Final   Organism ID, Bacteria METHICILLIN RESISTANT STAPHYLOCOCCUS AUREUS  Final      Susceptibility   Methicillin resistant staphylococcus aureus - MIC*    CIPROFLOXACIN >=8 RESISTANT Resistant     ERYTHROMYCIN >=8 RESISTANT Resistant     GENTAMICIN <=0.5 SENSITIVE Sensitive     OXACILLIN >=4 RESISTANT Resistant     TETRACYCLINE <=1 SENSITIVE Sensitive     VANCOMYCIN 1 SENSITIVE Sensitive     TRIMETH/SULFA <=10 SENSITIVE Sensitive     CLINDAMYCIN <=0.25 SENSITIVE Sensitive     RIFAMPIN <=0.5 SENSITIVE Sensitive     Inducible Clindamycin NEGATIVE Sensitive     * METHICILLIN RESISTANT STAPHYLOCOCCUS AUREUS  Blood Culture ID Panel (Reflexed)     Status: Abnormal   Collection Time: 09/30/18  1:30 AM  Result Value Ref Range Status   Enterococcus species NOT DETECTED NOT DETECTED Final   Listeria monocytogenes NOT DETECTED NOT DETECTED Final   Staphylococcus species DETECTED (A) NOT DETECTED Final    Comment: CRITICAL RESULT CALLED TO, READ BACK BY AND VERIFIED WITH: J. GRIMSLEY, PHARMD (WL) AT 2110 ON 09/30/18 BY C. JESSUP, MT.    Staphylococcus aureus (BCID) DETECTED (A) NOT DETECTED Final    Comment: Methicillin (oxacillin)-resistant Staphylococcus aureus (MRSA). MRSA is  predictably resistant to beta-lactam antibiotics (except ceftaroline). Preferred therapy is vancomycin unless clinically contraindicated. Patient requires contact precautions if  hospitalized. CRITICAL RESULT CALLED TO, READ BACK BY AND VERIFIED WITH: J. GRIMSLEY, PHARMD (WL) AT 2110 ON 09/30/18 BY C. JESSUP, MT.    Methicillin resistance DETECTED (A) NOT DETECTED Final    Comment: CRITICAL RESULT CALLED TO, READ BACK BY AND VERIFIED WITH: J. GRIMSLEY, PHARMD (WL) AT 2110 ON 09/30/18 BY C. JESSUP, MT.    Streptococcus species NOT DETECTED NOT DETECTED Final   Streptococcus agalactiae NOT DETECTED NOT DETECTED Final   Streptococcus pneumoniae NOT DETECTED NOT DETECTED Final   Streptococcus pyogenes NOT DETECTED NOT DETECTED Final   Acinetobacter baumannii NOT DETECTED NOT DETECTED Final   Enterobacteriaceae species NOT DETECTED NOT DETECTED Final   Enterobacter cloacae complex NOT DETECTED NOT DETECTED Final   Escherichia coli NOT DETECTED NOT DETECTED Final   Klebsiella oxytoca NOT DETECTED NOT DETECTED Final   Klebsiella pneumoniae NOT DETECTED NOT DETECTED Final   Proteus species NOT DETECTED NOT DETECTED Final   Serratia marcescens NOT DETECTED NOT DETECTED Final   Haemophilus influenzae NOT DETECTED NOT DETECTED Final   Neisseria meningitidis NOT DETECTED NOT DETECTED Final   Pseudomonas aeruginosa NOT DETECTED NOT DETECTED Final   Candida albicans NOT DETECTED NOT DETECTED Final   Candida glabrata NOT DETECTED NOT DETECTED Final   Candida krusei NOT DETECTED NOT DETECTED Final   Candida parapsilosis NOT DETECTED NOT DETECTED Final   Candida tropicalis NOT DETECTED NOT DETECTED Final    Comment: Performed at [facility], [place]. [address] [place], [place] [address]  SARS Coronavirus 2 [facility] order, Performed in [facility] hospital lab) Nasopharyngeal Nasopharyngeal Swab     Status: None   Collection Time: 09/30/18  1:40 AM   Specimen: Nasopharyngeal Swab  Result Value Ref  Range Status   SARS Coronavirus 2  NEGATIVE NEGATIVE Final    Comment: (NOTE) If result is NEGATIVE SARS-CoV-2 target nucleic acids are NOT DETECTED. The SARS-CoV-2 RNA is generally detectable in upper and lower  respiratory specimens during the acute phase of infection. The lowest  concentration of SARS-CoV-2 viral copies this assay can detect is 250  copies / mL. A negative result does not preclude SARS-CoV-2 infection  and should not be used as the sole basis for treatment or other  patient management decisions.  A negative result may occur with  improper specimen collection / handling, submission of specimen other  than nasopharyngeal swab, presence of viral mutation(s) within the  areas targeted by this assay, and inadequate number of viral copies  (<250 copies / mL). A negative result must be combined with clinical  observations, patient history, and epidemiological information. If result is POSITIVE SARS-CoV-2 target nucleic acids are DETECTED. The SARS-CoV-2 RNA is generally detectable in upper and lower  respiratory specimens dur ing the acute phase of infection.  Positive  results are indicative of active infection with SARS-CoV-2.  Clinical  correlation with patient history and other diagnostic information is  necessary to determine patient infection status.  Positive results do  not rule out bacterial infection or co-infection with other viruses. If result is PRESUMPTIVE POSTIVE SARS-CoV-2 nucleic acids MAY BE PRESENT.   A presumptive positive result was obtained on the submitted specimen  and confirmed on repeat testing.  While 2019 novel coronavirus  (SARS-CoV-2) nucleic acids may be present in the submitted sample  additional confirmatory testing may be necessary for epidemiological  and / or clinical management purposes  to differentiate between  SARS-CoV-2 and other Sarbecovirus currently known to infect humans.  If clinically indicated additional testing with an  alternate test  methodology [phone]) is advised. The SARS-CoV-2 RNA is generally  detectable in upper and lower respiratory sp ecimens during the acute  phase of infection. The expected result is Negative. Fact Sheet for Patients:  StrictlyIdeas.no Fact Sheet for Healthcare Providers: BankingDealers.co.za This test is not yet approved or cleared by the [place] FDA and has been authorized for detection and/or diagnosis of SARS-CoV-2 by FDA under an Emergency Use Authorization (EUA).  This EUA will remain in effect (meaning this test can be used) for the duration of the COVID-19 declaration under Section 564(b)(1) of the Act, 21 U.S.C. section 360bbb-3(b)(1), unless the authorization is terminated or revoked sooner. Performed at [facility], [place] [address] [place], [place] [address]   MRSA PCR Screening     Status: None   Collection Time: 09/30/18  4:01 AM   Specimen: Nasal Mucosa; Nasopharyngeal  Result Value Ref Range Status   MRSA by PCR NEGATIVE NEGATIVE Final    Comment:        The GeneXpert MRSA Assay (FDA approved for NASAL specimens only), is one component of a comprehensive MRSA colonization surveillance program. It is not intended to diagnose MRSA infection nor to guide or monitor treatment for MRSA infections. Performed at [facility], [place] [address] [place], [place] [address]   Surgical pcr screen     Status: None   Collection Time: 10/02/18  7:31 AM   Specimen: Nasal Mucosa; Nasal Swab  Result Value Ref Range Status   MRSA, PCR NEGATIVE NEGATIVE Final   Staphylococcus aureus NEGATIVE NEGATIVE Final    Comment: (NOTE) The Xpert SA Assay (FDA approved for NASAL specimens in patients 30 years of age and older), is one component of a  comprehensive surveillance program. It is not intended to diagnose infection nor to guide or monitor treatment. Performed at [facility], [place] [address] [place], [place] [address]   Culture, blood (routine x 2)     Status: None (Preliminary result)   Collection Time: 10/02/18  7:19 PM   Specimen: BLOOD LEFT HAND  Result Value Ref Range Status   Specimen Description BLOOD LEFT HAND  Final   Special Requests   Final    BOTTLES DRAWN AEROBIC AND ANAEROBIC Blood Culture results may not be optimal due to an inadequate volume of blood received in culture bottles   Culture   Final    NO GROWTH < 24 HOURS Performed at [facility], [place] [address], [place], [place] [address]    Report Status PENDING  Incomplete  Culture, blood (routine x 2)     Status: None (Preliminary result)   Collection Time: 10/02/18  9:02 PM   Specimen: BLOOD LEFT HAND  Result Value Ref Range Status   Specimen Description BLOOD LEFT HAND  Final   Special Requests   Final    BOTTLES DRAWN AEROBIC AND ANAEROBIC Blood Culture adequate volume   Culture   Final    NO GROWTH < 12 HOURS Performed at [facility], [place] [address] [place], [place] [address]    Report Status PENDING  Incomplete     Terri Piedra, NP [facility] [phone] Pager  10/03/2018  11:07 AM

## 2018-10-03 NOTE — Progress Notes (Signed)
ANTICOAGULATION CONSULT NOTE - Follow Up Consult  Pharmacy Consult for Heparin Indication: LV thrombus  Allergies  Allergen Reactions  . Amoxicillin-Pot Clavulanate Nausea And Vomiting    Did it involve swelling of the face/tongue/throat, SOB, or low BP? No Did it involve sudden or severe rash/hives, skin peeling, or any reaction on the inside of your mouth or nose? No Did you need to seek medical attention at a hospital or doctor's office? No When did it last happen?6 months ago If all above answers are "NO", may proceed with cephalosporin use.    Patient Measurements: Height: 6\' 3"  (190.5 cm) Weight: 180 lb 8.9 oz (81.9 kg) IBW/kg (Calculated) : 84.5 Heparin Dosing Weight: 81.9 kg  Vital Signs: Temp: 98.2 F (36.8 C) (09/16 1805) Temp Source: Oral (09/16 1805) BP: 125/86 (09/16 1805) Pulse Rate: 85 (09/16 1805)  Labs: Recent Labs    10/01/18 0236 10/02/18 0441 10/03/18 0913 10/03/18 1853  HGB 8.2*  --  8.0*  --   HCT 26.2*  --  26.0*  --   PLT 386  --  392  --   HEPARINUNFRC  --  <0.10* <0.10* <0.10*  CREATININE 1.78*  --  1.73*  --     Estimated Creatinine Clearance: 54.6 mL/min (A) (by C-G formula based on SCr of 1.73 mg/dL (H)).  Assessment:  58 yr old male started on IV heparin for LV thrombus last night.    Initial heparin level was undetectable (<0.10) on 900 unit/hr;  heparin 2000 units IV bolus given and infusion rate increased to 1050 units/hr ~6am today. Then heparin held for OR this afternoon.  S/p left BKA, IV heparin  resumed at midnight, ~ 8 hours post-op.  Heparin level this AM undetectable <0.1.   Goal of Therapy:  Heparin level 0.3-0.7 units/ml Monitor platelets by anticoagulation protocol: Yes   Plan:   Increase heparin drip to 1650 units/hr, no bolus.  Heparin level 6 hrs after rate increase.  Daily heparin level and CBC.  Thank you Anette Guarneri, PharmD Please utilize Amion for appropriate phone number to reach the unit pharmacist ([place])   10/03/2018,7:31 PM

## 2018-10-03 NOTE — Anesthesia Postprocedure Evaluation (Signed)
Anesthesia Post Note  Patient: Remy Kihm  Procedure(s) Performed: left BELOW KNEE AMPUTATION (Left Knee)     Patient location during evaluation: PACU Anesthesia Type: Regional Level of consciousness: awake and alert Pain management: pain level controlled Vital Signs Assessment: post-procedure vital signs reviewed and stable Respiratory status: spontaneous breathing, nonlabored ventilation, respiratory function stable and patient connected to nasal cannula oxygen Cardiovascular status: stable and blood pressure returned to baseline Postop Assessment: no apparent nausea or vomiting Anesthetic complications: no    Last Vitals:  Vitals:   10/03/18 0545 10/03/18 0651  BP: 121/88   Pulse: 87 85  Resp: 16   Temp: (!) 36.3 C   SpO2: 95% 98%    Last Pain:  Vitals:   10/03/18 1023  TempSrc:   PainSc: 10-Worst pain ever                 Tiajuana Amass

## 2018-10-03 NOTE — TOC Initial Note (Signed)
Transition of Care [facility]) - Initial/Assessment Note    Patient Details  Name: Daniel Moon MRN: [id number] Date of Birth: October 20, 1960  Transition of Care [facility]) CM/SW Contact:    Marilu Favre, RN Phone Number: 10/03/2018, 11:22 AM  Clinical Narrative:                 Confirmed facesheet information with patient at bedside. Best phone number to reach him is [phone]. Patient from home with his mother.   Await PT recommendations.   Patient wishes to return to home at discharge.   He has no DME at home currently.   Explained he will need long term IV antibiotics and he  will taught how to administer antibiotics since [facility] will not be there every time a dose is due. Patient voiced understanding.   Provided medicare.gov list of home health agencies. Patient has no preference. [facility] will be infusion company.   Patient states he has been alcohol and drug free for 2 years.        Expected Discharge Plan: [place] Barriers to Discharge: Continued Medical Work up   Patient Goals and CMS Choice Patient states their goals for this hospitalization and ongoing recovery are:: to return to home CMS Medicare.gov Compare Post Acute Care list provided to:: Patient Choice offered to / list presented to : Patient  Expected Discharge Plan and Services Expected Discharge Plan: [place] Choice: [place] arrangements for the past 2 months: Single Family Home                                      Prior Living Arrangements/Services Living arrangements for the past 2 months: Single Family Home Lives with:: Parents(mother) Patient language and need for interpreter reviewed:: Yes        Need for Family Participation in Patient Care: Yes (Comment) Care giver support system in place?: Yes (comment)   Criminal Activity/Legal Involvement Pertinent to Current Situation/Hospitalization: No -  Comment as needed  Activities of Daily Living Home Assistive Devices/Equipment: Cane (specify quad or straight)(straight cane) ADL Screening (condition at time of admission) Patient's cognitive ability adequate to safely complete daily activities?: Yes Is the patient deaf or have difficulty hearing?: No Does the patient have difficulty seeing, even when wearing glasses/contacts?: No Does the patient have difficulty concentrating, remembering, or making decisions?: No Patient able to express need for assistance with ADLs?: Yes Does the patient have difficulty dressing or bathing?: Yes Independently performs ADLs?: No Communication: Independent Dressing (OT): Needs assistance Is this a change from baseline?: Pre-admission baseline Grooming: Independent Feeding: Independent Bathing: Independent Toileting: Needs assistance Is this a change from baseline?: Change from baseline, expected to last >3days In/Out Bed: Needs assistance Is this a change from baseline?: Pre-admission baseline Walks in Home: Independent with device (comment) Does the patient have difficulty walking or climbing stairs?: Yes Weakness of Legs: Both Weakness of Arms/Hands: None  Permission Sought/Granted Permission sought to share information with : Case Manager Permission granted to share information with : Yes, Verbal Permission Granted     Permission granted to share info w AGENCY: home health agencies no preference        Emotional Assessment Appearance:: (patient was lying in bed with sheet over his head) Attitude/Demeanor/Rapport: Engaged Affect (typically observed): Accepting Orientation: : Oriented to  Self, Oriented to Place, Oriented to  Time, Oriented to Situation Alcohol / Substance Use: Not Applicable Psych Involvement: No (comment)  Admission diagnosis:  Sepsis ([place]) [A41.9] Anemia, unspecified type [D64.9] Osteomyelitis of left foot, unspecified type ([place]) [M86.9] Sepsis, due to unspecified  organism, unspecified whether acute organ dysfunction present [facility]) [A41.9] Patient Active Problem List   Diagnosis Date Noted  . MRSA bacteremia 10/01/2018  . PAD (peripheral artery disease) ([place]) 10/01/2018  . Peripheral neuropathy 10/01/2018  . Cigarette smoker 10/01/2018  . S/P transmetatarsal amputation of foot, left ([place]) 10/01/2018  . Pressure injury of skin 10/01/2018  . Chronic renal insufficiency, stage 3 (moderate) ([place]) 10/01/2018  . Paroxysmal atrial fibrillation ([place]) 10/01/2018  . Unintentional weight loss 10/01/2018  . Poor dentition 10/01/2018  . Sepsis ([place]) 09/30/2018  . Open wound of left foot 09/30/2018  . Osteomyelitis ([place]) 09/30/2018  . Leukocytosis 09/30/2018  . Fever 09/30/2018  . Microcytic anemia 09/30/2018  . HTN (hypertension) 02/23/2018  . Diabetes ([place]) 02/23/2018  . HLD (hyperlipidemia) 02/23/2018  . COPD (chronic obstructive pulmonary disease) ([place]) 02/23/2018   PCP:  Sanjuana Letters, MD Pharmacy:   [place], [place] - [address] [address] Phone: [phone] Fax: [phone]     Social Determinants of Health (SDOH) Interventions    Readmission Risk Interventions No flowsheet data found.

## 2018-10-03 NOTE — Progress Notes (Signed)
OT Cancellation Note  Patient Details Name: Daniel Moon MRN: [id number] DOB: 10-12-1960   Cancelled Treatment:    Reason Eval/Treat Not Completed: Medical issues which prohibited therapy;Patient not medically ready(awaiting therapeutic heparin level, pt with LV thrombus). Will continue to follow.  [name] 10/03/2018, 11:22 AM  [name], OTR/L Acute Rehabilitation Services Pager: [phone] Office: [phone]

## 2018-10-04 DIAGNOSIS — D649 Anemia, unspecified: Secondary | ICD-10-CM

## 2018-10-04 DIAGNOSIS — I480 Paroxysmal atrial fibrillation: Secondary | ICD-10-CM

## 2018-10-04 DIAGNOSIS — I10 Essential (primary) hypertension: Secondary | ICD-10-CM

## 2018-10-04 DIAGNOSIS — M8660 Other chronic osteomyelitis, unspecified site: Secondary | ICD-10-CM

## 2018-10-04 DIAGNOSIS — N183 Chronic kidney disease, stage 3 (moderate): Secondary | ICD-10-CM

## 2018-10-04 DIAGNOSIS — J431 Panlobular emphysema: Secondary | ICD-10-CM

## 2018-10-04 DIAGNOSIS — M869 Osteomyelitis, unspecified: Secondary | ICD-10-CM

## 2018-10-04 DIAGNOSIS — D509 Iron deficiency anemia, unspecified: Secondary | ICD-10-CM

## 2018-10-04 DIAGNOSIS — F17210 Nicotine dependence, cigarettes, uncomplicated: Secondary | ICD-10-CM

## 2018-10-04 DIAGNOSIS — Z89432 Acquired absence of left foot: Secondary | ICD-10-CM

## 2018-10-04 LAB — VANCOMYCIN, TROUGH: Vancomycin Tr: 29 ug/mL (ref 15–20)

## 2018-10-04 LAB — BASIC METABOLIC PANEL
Anion gap: 8 (ref 5–15)
BUN: 19 mg/dL (ref 6–20)
CO2: 17 mmol/L — ABNORMAL LOW (ref 22–32)
Calcium: 7.6 mg/dL — ABNORMAL LOW (ref 8.9–10.3)
Chloride: 109 mmol/L (ref 98–111)
Creatinine, Ser: 1.39 mg/dL — ABNORMAL HIGH (ref 0.61–1.24)
GFR calc Af Amer: >60 mL/min (ref >60)
GFR calc non Af Amer: 56 mL/min — ABNORMAL LOW (ref >60)
Glucose, Bld: 126 mg/dL — ABNORMAL HIGH (ref 70–99)
Potassium: 4.4 mmol/L (ref 3.5–5.1)
Sodium: 134 mmol/L — ABNORMAL LOW (ref 135–145)

## 2018-10-04 LAB — CBC
HCT: 23 % — ABNORMAL LOW (ref 39.0–52.0)
Hemoglobin: 7.4 g/dL — ABNORMAL LOW (ref 13.0–17.0)
MCH: 22.8 pg — ABNORMAL LOW (ref 26.0–34.0)
MCHC: 32.2 g/dL (ref 30.0–36.0)
MCV: 70.8 fL — ABNORMAL LOW (ref 80.0–100.0)
Platelets: 385 10*3/uL (ref 150–400)
RBC: 3.25 MIL/uL — ABNORMAL LOW (ref 4.22–5.81)
RDW: 21.7 % — ABNORMAL HIGH (ref 11.5–15.5)
WBC: 12.7 10*3/uL — ABNORMAL HIGH (ref 4.0–10.5)
nRBC: 0.2 % (ref 0.0–0.2)

## 2018-10-04 LAB — HEPARIN LEVEL (UNFRACTIONATED)
Heparin Unfractionated: 0.12 IU/mL — ABNORMAL LOW (ref 0.30–0.70)
Heparin Unfractionated: 0.26 IU/mL — ABNORMAL LOW (ref 0.30–0.70)
Heparin Unfractionated: 0.42 IU/mL (ref 0.30–0.70)

## 2018-10-04 LAB — HCV RNA QUANT: HCV Quantitative: NOT DETECTED IU/mL (ref >50)

## 2018-10-04 LAB — GLUCOSE, CAPILLARY
Glucose-Capillary: 92 mg/dL (ref 70–99)
Glucose-Capillary: 86 mg/dL (ref 70–99)
Glucose-Capillary: 126 mg/dL — ABNORMAL HIGH (ref 70–99)
Glucose-Capillary: 85 mg/dL (ref 70–99)

## 2018-10-04 LAB — VANCOMYCIN, PEAK: Vancomycin Pk: 46 ug/mL — ABNORMAL HIGH (ref 30–40)

## 2018-10-04 MED ORDER — MORPHINE SULFATE (PF) 2 MG/ML IV SOLN
2.0000 mg | Freq: Once | INTRAVENOUS | Status: AC
  Administered 2018-10-04: 2 mg via INTRAVENOUS
  Filled 2018-10-04: qty 1

## 2018-10-04 MED ORDER — GABAPENTIN 300 MG PO CAPS
300.0000 mg | ORAL_CAPSULE | Freq: Two times a day (BID) | ORAL | Status: DC
  Administered 2018-10-04 – 2018-10-06 (×5): 300 mg via ORAL
  Filled 2018-10-04 (×5): qty 1

## 2018-10-04 MED ORDER — MORPHINE SULFATE ER 15 MG PO TBCR
15.0000 mg | EXTENDED_RELEASE_TABLET | Freq: Two times a day (BID) | ORAL | Status: DC
  Administered 2018-10-04 – 2018-10-10 (×13): 15 mg via ORAL
  Filled 2018-10-04 (×14): qty 1

## 2018-10-04 MED ORDER — CYCLOBENZAPRINE HCL 10 MG PO TABS
10.0000 mg | ORAL_TABLET | Freq: Once | ORAL | Status: AC
  Administered 2018-10-04: 10 mg via ORAL
  Filled 2018-10-04: qty 1

## 2018-10-04 NOTE — Progress Notes (Signed)
PT Cancellation Note  Patient Details Name: Daniel Moon MRN: [id number] DOB: 09-Mar-1960   Cancelled Treatment:    Reason Eval/Treat Not Completed: Medical issues which prohibited therapy - Pt's heparin level remains subtherapeutic and pt with LV thrombus, awaiting response from Dr. Sherral Hammers in regards to when pt mobility is safe. Pt also in severe pain upon checking on pt with RN. PT to check back as schedule allows.  Julien Girt, PT Acute Rehabilitation Services Pager [phone]  Office [phone]    Roxine Caddy D Elonda Husky 10/04/2018, 2:47 PM

## 2018-10-04 NOTE — Progress Notes (Signed)
ANTICOAGULATION CONSULT NOTE - Follow Up Consult  Pharmacy Consult for Heparin Indication: LV thrombus  Allergies  Allergen Reactions  . Amoxicillin-Pot Clavulanate Nausea And Vomiting    Did it involve swelling of the face/tongue/throat, SOB, or low BP? No Did it involve sudden or severe rash/hives, skin peeling, or any reaction on the inside of your mouth or nose? No Did you need to seek medical attention at a hospital or doctor's office? No When did it last happen?6 months ago If all above answers are "NO", may proceed with cephalosporin use.    Patient Measurements: Height: 6\' 3"  (190.5 cm) Weight: 180 lb 8.9 oz (81.9 kg) IBW/kg (Calculated) : 84.5 Heparin Dosing Weight: 81.9 kg  Vital Signs: Temp: 97.9 F (36.6 C) (09/17 2035) Temp Source: Oral (09/17 2035) BP: 112/79 (09/17 2035) Pulse Rate: 79 (09/17 2035)  Labs: Recent Labs    10/03/18 0913  10/04/18 0253 10/04/18 1008 10/04/18 1957  HGB 8.0*  --  7.4*  --   --   HCT 26.0*  --  23.0*  --   --   PLT 392  --  385  --   --   HEPARINUNFRC <0.10*   < > 0.12* 0.26* 0.42  CREATININE 1.73*  --  1.39*  --   --    < > = values in this interval not displayed.    Estimated Creatinine Clearance: 67.9 mL/min (A) (by C-G formula based on SCr of 1.39 mg/dL (H)).  Assessment: 58 yr old male started on IV heparin for LV thrombus.  S/p left BKA and heparin resumed post-op.  Heparin level was subtherapeutic (0.26 units/ml) earlier today on heparin infusion at 1900 units/hr. Heparin infusion was increased to 2100 units/hr (no bolus).  Heparin level this evening, ~7 hours after infusion rate increase to 2100 units/hr, was 0.42 units/ml, which is within the goal range for this patient. Per RN, no issues with IV or signs/sx of bleeding observed.  H/H 7.4/23.0, stable; platelets WNL   Goal of Therapy:  Heparin level 0.3-0.7 units/ml Monitor platelets by anticoagulation protocol: Yes   Plan:  Continue heparin infusion at  2100 units/hr Check confirmatory heparin level in 6 hours Monitor daily heparin level, CBC Monitor for signs/sx of bleeding  Gillermina Hu, PharmD, BCPS, [facility] Clinical Pharmacist 10/04/2018,9:05 PM

## 2018-10-04 NOTE — Progress Notes (Signed)
ANTICOAGULATION CONSULT NOTE - Follow Up Consult  Pharmacy Consult for Heparin Indication: LV thrombus  Allergies  Allergen Reactions  . Amoxicillin-Pot Clavulanate Nausea And Vomiting    Did it involve swelling of the face/tongue/throat, SOB, or low BP? No Did it involve sudden or severe rash/hives, skin peeling, or any reaction on the inside of your mouth or nose? No Did you need to seek medical attention at a hospital or doctor's office? No When did it last happen?6 months ago If all above answers are "NO", may proceed with cephalosporin use.    Patient Measurements: Height: 6\' 3"  (190.5 cm) Weight: 180 lb 8.9 oz (81.9 kg) IBW/kg (Calculated) : 84.5 Heparin Dosing Weight: 81.9 kg  Vital Signs: Temp: 97.6 F (36.4 C) (09/17 0408) Temp Source: Oral (09/17 0408) BP: 123/80 (09/17 0408) Pulse Rate: 77 (09/17 0408)  Labs: Recent Labs    10/03/18 0913 10/03/18 1853 10/04/18 0253 10/04/18 1008  HGB 8.0*  --  7.4*  --   HCT 26.0*  --  23.0*  --   PLT 392  --  385  --   HEPARINUNFRC <0.10* <0.10* 0.12* 0.26*  CREATININE 1.73*  --  1.39*  --     Estimated Creatinine Clearance: 67.9 mL/min (A) (by C-G formula based on SCr of 1.39 mg/dL (H)).  Assessment:  58 yr old male started on IV heparin for LV thrombus.  S/p left BKA and heparin resumed post-op.  Heparin level subtherapeutic (0.26) on gtt at 1900 units/hr. No issues with line or bleeding reported per RN. Hgb down to 7.4.    Goal of Therapy:  Heparin level 0.3-0.7 units/ml Monitor platelets by anticoagulation protocol: Yes   Plan:   Increase heparin drip to 2100 units/hr, no bolus.  Heparin level 6 hrs after rate increase.  Daily heparin level and CBC.  Gwen Sarvis A. Levada Dy, PharmD, BCPS, FNKF Clinical Pharmacist [place] Please utilize Amion for appropriate phone number to reach the unit pharmacist ([place])   10/04/2018,12:22 PM

## 2018-10-04 NOTE — Progress Notes (Signed)
PROGRESS NOTE    Daniel Moon  [id number] DOB: [birth year] DOA: [id number] PCP: Sanjuana Letters, MD   Brief Narrative:  Daniel Moon is a 58 y.o. BM PMHx DM type II uncontrolled with complication, DM peripheral neuropathy, nonhealing left transmetatarsal amputation wound and limb threatening ischemia   Admitted with sepsis likely due to osteomyelitis. The patient is a poor historian but tells me he started having fevers today at home. He says he was feeling fine until today when he says his family heard him wake up yelling in his sleep. He was found to have a fever and his family called EMS. He last had balloon angioplasty in July and says he was given antibiotics after that procedure but didn't take them since they made him feel sick. Since that time, he has not seen a physician.   ED Course: He was found to be febrile, have a leukocytosis and XR of foot shows evidence of osteomyelitis. He was given IVF but gently due to his history of CHF, and his blood pressure is stable. He was given empiric vancomycin and zosyn.   Subjective: 9/17 A/O x3 (does not know when), patient's words somewhat slurred when answering except when he becomes belligerent at which times he starts yelling and cursing.  States none of the pain medication helps with his leg pain however knows the dosing they gave him but does not know what type of medication when he is asked, again tends to slur his speech and become belligerent when pressed for answers.   Assessment & Plan:   Principal Problem:   MRSA bacteremia Active Problems:   HTN (hypertension)   Diabetes ([place])   HLD (hyperlipidemia)   COPD (chronic obstructive pulmonary disease) ([place])   Sepsis ([place])   Open wound of left foot   Osteomyelitis (HCC)   Microcytic anemia   PAD (peripheral artery disease) (HCC)   Peripheral neuropathy   Cigarette smoker   S/P transmetatarsal amputation of foot, left (HCC)   Pressure injury of skin   Chronic  renal insufficiency, stage 3 (moderate) (HCC)   Paroxysmal atrial fibrillation (HCC)   Unintentional weight loss   Poor dentition  Sepsis secondary to LEFT lower extremity osteomyelitis/MRSA bacteremia -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 - Poorly controlled pain - Continue antibiotics per orthopedic surgery - 9/13 blood cultures positive MRSA - Repeat blood cultures 9/15 and 9/16 pending - Hold on PICC line until new cultures, finalized or patient afebrile  LEFT ventricular thrombus insetting proximal atrial fibrillation/medication noncompliance - Continue heparin drip - 9/18 discussed case with cardiology watchman vs DOAC  Acute on CKD stage II (baseline Cr 1.4) Recent Labs  Lab 09/30/18 0054 10/01/18 0236 10/03/18 0913 10/04/18 0253  CREATININE 1.42* 1.78* 1.73* 1.39*  - At baseline  Hyperkalemia -Potassium previously 5.4 no EKG changes -Resolved  Iron deficiency anemia/anemia of chronic disease -Presented hemoglobin 6.9  - Appears multiple transfusions -Iron studies done on 09/30/2018 significant for iron deficiency -Hold off IV iron supplement in the setting of active infective process -Start p.o. ferrous sulfate 325 daily  Peripheral vascular disease ABI ordered and pending Hold off Plavix given need for full dose anticoagulation, could consider 81 aspirin at discharge  Chronic systolic CHF -EF 20 to [id number] -Strict in and out -Daily weight -Se[facility] at [facility] -TTE remarkable for 2.6 x 2 cm LV thrombus -  Hyperglycemia vs diabetes   -Hemoglobin A1c pending - Moderate SSI  Noncompliance medication     DVT prophylaxis: Heparin drip Code Status: Full  Family Communication: None Disposition Plan: TBD   Consultants:  Vascular surgery ID   Procedures/Significant Events:     I have personally reviewed and interpreted all radiology studies and my findings are as above.  VENTILATOR SETTINGS:    Cultures   Antimicrobials:    Devices    LINES / TUBES:       Continuous Infusions: . sodium chloride 50 mL/hr at 10/03/18 1610  . sodium chloride    . heparin 1,900 Units/hr (10/04/18 0408)  . vancomycin 1,250 mg (10/03/18 2259)     Objective: Vitals:   10/03/18 0651 10/03/18 1805 10/03/18 2055 10/04/18 0408  BP:  125/86 (!) 137/94 123/80  Pulse: 85 85 88 77  Resp:  17 18 18   Temp:  98.2 F (36.8 C) 97.6 F (36.4 C) 97.6 F (36.4 C)  TempSrc:  Oral Oral Oral  SpO2: 98% 98% 95% 96%  Weight:      Height:        Intake/Output Summary (Last 24 hours) at 10/04/2018 0816 Last data filed at 10/04/2018 0435 Gross per 24 hour  Intake 2515.53 ml  Output 515 ml  Net 2000.53 ml   Filed Weights   09/30/18 0330 09/30/18 0606 10/01/18 1930  Weight: 75.6 kg 75.6 kg 81.9 kg    Examination:  General: A/O x3 (does not know when), slurred speech, belligerent, cursing at staff, unable answer simple questions (what hospital was he transferred from), no acute respiratory distress Eyes: negative scleral hemorrhage, negative anisocoria, negative icterus ENT: Negative Runny nose, negative gingival bleeding, Neck:  Negative scars, masses, torticollis, lymphadenopathy, JVD Lungs: Clear to auscultation bilaterally without wheezes or crackles Cardiovascular: Regular rate and rhythm without murmur gallop or rub normal S1 and S2 Abdomen: negative abdominal pain, nondistended, positive soft, bowel sounds, no rebound, no ascites, no appreciable mass Extremities: LEFT BKA, covered and clean did not take down dressing.   Skin: Negative rashes, lesions, ulcers Psychiatric: Unable to fully evaluate patient somewhat sleepy, very belligerent, cursing toward staff.    Central nervous system:  Cranial nerves II through XII intact, tongue/uvula midline, all extremities muscle strength 5/5, sensation intact tnegative dysarthria, negative expressive aphasia, negative receptive aphasia.  .     Data Reviewed: Care during the described time interval was provided by  me .  I have reviewed this patient's available data, including medical history, events of note, physical examination, and all test results as part of my evaluation.   CBC: Recent Labs  Lab 09/30/18 0054 10/01/18 0236 10/03/18 0913 10/04/18 0253  WBC 25.0* 15.8* 13.3* 12.7*  NEUTROABS 22.2* 11.7*  --   --   HGB 6.9* 8.2* 8.0* 7.4*  HCT 22.0* 26.2* 26.0* 23.0*  MCV 68.8* 70.8* 73.0* 70.8*  PLT 399 386 392 0000000   Basic Metabolic Panel: Recent Labs  Lab 09/30/18 0054 10/01/18 0236 10/03/18 0913 10/04/18 0253  NA 132* 134* 135 134*  K 4.6 5.4* 4.6 4.4  CL 103 102 109 109  CO2 22 21* 18* 17*  GLUCOSE 111* 157* 119* 126*  BUN 24* 27* 23* 19  CREATININE 1.42* 1.78* 1.73* 1.39*  CALCIUM 7.8* 7.6* 7.8* 7.6*   GFR: Estimated Creatinine Clearance: 67.9 mL/min (A) (by C-G formula based on SCr of 1.39 mg/dL (H)). Liver Function Tests: Recent Labs  Lab 09/30/18 0054  AST 12*  ALT 8  ALKPHOS 107  BILITOT 1.1  PROT 8.7*  ALBUMIN 1.9*   No results for input(s): LIPASE, AMYLASE in the last 168 hours. No results  for input(s): AMMONIA in the last 168 hours. Coagulation Profile: Recent Labs  Lab 09/30/18 0054  INR 1.4*   Cardiac Enzymes: No results for input(s): CKTOTAL, CKMB, CKMBINDEX, TROPONINI in the last 168 hours. BNP (last 3 results) No results for input(s): PROBNP in the last 8760 hours. HbA1C: No results for input(s): HGBA1C in the last 72 hours. CBG: Recent Labs  Lab 10/03/18 0836 10/03/18 0847 10/03/18 1147 10/03/18 1716 10/03/18 2102  GLUCAP 67* 78 193* 133* 161*   Lipid Profile: No results for input(s): CHOL, HDL, LDLCALC, TRIG, CHOLHDL, LDLDIRECT in the last 72 hours. Thyroid Function Tests: No results for input(s): TSH, T4TOTAL, FREET4, T3FREE, THYROIDAB in the last 72 hours. Anemia Panel: No results for input(s): VITAMINB12, FOLATE, FERRITIN, TIBC, IRON, RETICCTPCT in the last 72 hours. Urine analysis:    Component Value Date/Time   COLORURINE  AMBER (A) 09/30/2018 0051   APPEARANCEUR CLOUDY (A) 09/30/2018 0051   LABSPEC 1.024 09/30/2018 0051   PHURINE 5.0 09/30/2018 0051   GLUCOSEU NEGATIVE 09/30/2018 0051   HGBUR NEGATIVE 09/30/2018 0051   BILIRUBINUR NEGATIVE 09/30/2018 0051   KETONESUR NEGATIVE 09/30/2018 0051   PROTEINUR 100 (A) 09/30/2018 0051   NITRITE NEGATIVE 09/30/2018 0051   LEUKOCYTESUR NEGATIVE 09/30/2018 0051   Sepsis Labs: @LABRCNTIP (procalcitonin:4,lacticidven:4)  ) Recent Results (from the past 240 hour(s))  Culture, blood (Routine x 2)     Status: Abnormal   Collection Time: 09/30/18 12:54 AM   Specimen: BLOOD  Result Value Ref Range Status   Specimen Description   Final    BLOOD LEFT ANTECUBITAL Performed at [facility], [place] [address], [place], [place] [address]    Special Requests   Final    BOTTLES DRAWN AEROBIC AND ANAEROBIC Blood Culture results may not be optimal due to an inadequate volume of blood received in culture bottles Performed at [facility] [address], [place], [place] [address]    Culture  Setup Time   Final    GRAM POSITIVE COCCI IN CLUSTERS IN BOTH AEROBIC AND ANAEROBIC BOTTLES CRITICAL RESULT CALLED TO, READ BACK BY AND VERIFIED WITH: J. GRIMSLEY, PHARMD (WL) AT 2110 ON 09/30/18 BY C. JESSUP, MT.    Culture (A)  Final    STAPHYLOCOCCUS AUREUS SUSCEPTIBILITIES PERFORMED ON PREVIOUS CULTURE WITHIN THE LAST 5 DAYS. Performed at [facility], [place] [address] [place], [place] [address]    Report Status 10/02/2018 FINAL  Final  Culture, blood (Routine x 2)     Status: Abnormal   Collection Time: 09/30/18  1:30 AM   Specimen: BLOOD RIGHT FOREARM  Result Value Ref Range Status   Specimen Description   Final    BLOOD RIGHT FOREARM Performed at [facility] [address] [place], [place] [address]    Special Requests   Final    BOTTLES DRAWN AEROBIC AND ANAEROBIC Blood Culture adequate volume Performed at  [facility] [address], [place], [place] [address]    Culture  Setup Time   Final    GRAM POSITIVE COCCI IN CLUSTERS IN BOTH AEROBIC AND ANAEROBIC BOTTLES CRITICAL RESULT CALLED TO, READ BACK BY AND VERIFIED WITH: Lavell Luster, PHARMD (WL) AT 2110 ON 09/30/18 BY C. JESSUP, MT. Performed at [facility], [place] [address] [place], [place] [address]    Culture METHICILLIN RESISTANT STAPHYLOCOCCUS AUREUS (A)  Final   Report Status 10/02/2018 FINAL  Final   Organism ID, Bacteria METHICILLIN RESISTANT STAPHYLOCOCCUS AUREUS  Final      Susceptibility   Methicillin resistant staphylococcus aureus - MIC*    CIPROFLOXACIN >=8 RESISTANT Resistant     ERYTHROMYCIN >=8 RESISTANT Resistant     GENTAMICIN <=0.5 SENSITIVE Sensitive     OXACILLIN >=4 RESISTANT Resistant     TETRACYCLINE <=1 SENSITIVE Sensitive     VANCOMYCIN 1 SENSITIVE Sensitive     TRIMETH/SULFA <=10 SENSITIVE Sensitive     CLINDAMYCIN <=0.25 SENSITIVE Sensitive     RIFAMPIN <=0.5 SENSITIVE Sensitive     Inducible Clindamycin NEGATIVE Sensitive     * METHICILLIN RESISTANT STAPHYLOCOCCUS AUREUS  Blood Culture ID Panel (Reflexed)     Status: Abnormal   Collection Time: 09/30/18  1:30 AM  Result Value Ref Range Status   Enterococcus species NOT DETECTED NOT DETECTED Final   Listeria monocytogenes NOT DETECTED NOT DETECTED Final   Staphylococcus species DETECTED (A) NOT DETECTED Final    Comment: CRITICAL RESULT CALLED TO, READ BACK BY AND VERIFIED WITH: J. GRIMSLEY, PHARMD (WL) AT 2110 ON 09/30/18 BY C. JESSUP, MT.    Staphylococcus aureus (BCID) DETECTED (A) NOT DETECTED Final    Comment: Methicillin (oxacillin)-resistant Staphylococcus aureus (MRSA). MRSA is predictably resistant to beta-lactam antibiotics (except ceftaroline). Preferred therapy is vancomycin unless clinically contraindicated. Patient requires contact precautions if  hospitalized. CRITICAL RESULT CALLED TO, READ BACK BY AND VERIFIED  WITH: J. GRIMSLEY, PHARMD (WL) AT 2110 ON 09/30/18 BY C. JESSUP, MT.    Methicillin resistance DETECTED (A) NOT DETECTED Final    Comment: CRITICAL RESULT CALLED TO, READ BACK BY AND VERIFIED WITH: J. GRIMSLEY, PHARMD (WL) AT 2110 ON 09/30/18 BY C. JESSUP, MT.    Streptococcus species NOT DETECTED NOT DETECTED Final   Streptococcus agalactiae NOT DETECTED NOT DETECTED Final   Streptococcus pneumoniae NOT DETECTED NOT DETECTED Final   Streptococcus pyogenes NOT DETECTED NOT DETECTED Final   Acinetobacter baumannii NOT DETECTED NOT DETECTED Final   Enterobacteriaceae species NOT DETECTED NOT DETECTED Final   Enterobacter cloacae complex NOT DETECTED NOT DETECTED Final   Escherichia coli NOT DETECTED NOT DETECTED Final   Klebsiella oxytoca NOT DETECTED NOT DETECTED Final   Klebsiella pneumoniae NOT DETECTED NOT DETECTED Final   Proteus species NOT DETECTED NOT DETECTED Final   Serratia marcescens NOT DETECTED NOT DETECTED Final   Haemophilus influenzae NOT DETECTED NOT DETECTED Final   Neisseria meningitidis NOT DETECTED NOT DETECTED Final   Pseudomonas aeruginosa NOT DETECTED NOT DETECTED Final   Candida albicans NOT DETECTED NOT DETECTED Final   Candida glabrata NOT DETECTED NOT DETECTED Final   Candida krusei NOT DETECTED NOT DETECTED Final   Candida parapsilosis NOT DETECTED NOT DETECTED Final   Candida tropicalis NOT DETECTED NOT DETECTED Final    Comment: Performed at [facility], [place] [address] [place],  [address]  SARS Coronavirus 2 [facility] order, Performed in [facility] hospital lab) Nasopharyngeal Nasopharyngeal Swab     Status: None   Collection Time: 09/30/18  1:40 AM   Specimen: Nasopharyngeal Swab  Result Value Ref Range Status   SARS Coronavirus 2 NEGATIVE NEGATIVE Final    Comment: (NOTE) If result is NEGATIVE SARS-CoV-2 target nucleic acids are NOT DETECTED. The SARS-CoV-2 RNA is generally detectable in upper and lower  respiratory specimens during  the acute phase of infection. The lowest  concentration of SARS-CoV-2 viral copies this assay can detect is 250  copies / mL. A negative result does not preclude SARS-CoV-2 infection  and should not be used as  the sole basis for treatment or other  patient management decisions.  A negative result may occur with  improper specimen collection / handling, submission of specimen other  than nasopharyngeal swab, presence of viral mutation(s) within the  areas targeted by this assay, and inadequate number of viral copies  (<250 copies / mL). A negative result must be combined with clinical  observations, patient history, and epidemiological information. If result is POSITIVE SARS-CoV-2 target nucleic acids are DETECTED. The SARS-CoV-2 RNA is generally detectable in upper and lower  respiratory specimens dur ing the acute phase of infection.  Positive  results are indicative of active infection with SARS-CoV-2.  Clinical  correlation with patient history and other diagnostic information is  necessary to determine patient infection status.  Positive results do  not rule out bacterial infection or co-infection with other viruses. If result is PRESUMPTIVE POSTIVE SARS-CoV-2 nucleic acids MAY BE PRESENT.   A presumptive positive result was obtained on the submitted specimen  and confirmed on repeat testing.  While 2019 novel coronavirus  (SARS-CoV-2) nucleic acids may be present in the submitted sample  additional confirmatory testing may be necessary for epidemiological  and / or clinical management purposes  to differentiate between  SARS-CoV-2 and other Sarbecovirus currently known to infect humans.  If clinically indicated additional testing with an alternate test  methodology [phone]) is advised. The SARS-CoV-2 RNA is generally  detectable in upper and lower respiratory sp ecimens during the acute  phase of infection. The expected result is Negative. Fact Sheet for Patients:   StrictlyIdeas.no Fact Sheet for Healthcare Providers: BankingDealers.co.za This test is not yet approved or cleared by the [place] FDA and has been authorized for detection and/or diagnosis of SARS-CoV-2 by FDA under an Emergency Use Authorization (EUA).  This EUA will remain in effect (meaning this test can be used) for the duration of the COVID-19 declaration under Section 564(b)(1) of the Act, 21 U.S.C. section 360bbb-3(b)(1), unless the authorization is terminated or revoked sooner. Performed at [facility], [place] [address] [place], [place] [address]   MRSA PCR Screening     Status: None   Collection Time: 09/30/18  4:01 AM   Specimen: Nasal Mucosa; Nasopharyngeal  Result Value Ref Range Status   MRSA by PCR NEGATIVE NEGATIVE Final    Comment:        The GeneXpert MRSA Assay (FDA approved for NASAL specimens only), is one component of a comprehensive MRSA colonization surveillance program. It is not intended to diagnose MRSA infection nor to guide or monitor treatment for MRSA infections. Performed at [facility], [place] [address] [place], [place] [address]   Surgical pcr screen     Status: None   Collection Time: 10/02/18  7:31 AM   Specimen: Nasal Mucosa; Nasal Swab  Result Value Ref Range Status   MRSA, PCR NEGATIVE NEGATIVE Final   Staphylococcus aureus NEGATIVE NEGATIVE Final    Comment: (NOTE) The Xpert SA Assay (FDA approved for NASAL specimens in patients 62 years of age and older), is one component of a comprehensive surveillance program. It is not intended to diagnose infection nor to guide or monitor treatment. Performed at [facility], [place] [address], [place], [place] [address]   Culture, blood (routine x 2)     Status: None (Preliminary result)   Collection Time: 10/02/18  7:19 PM   Specimen: BLOOD LEFT HAND  Result Value Ref Range Status   Specimen  Description BLOOD LEFT  HAND  Final   Special Requests   Final    BOTTLES DRAWN AEROBIC AND ANAEROBIC Blood Culture results may not be optimal due to an inadequate volume of blood received in culture bottles   Culture   Final    NO GROWTH < 24 HOURS Performed at [facility] [address], [place], [place] [address]    Report Status PENDING  Incomplete  Culture, blood (routine x 2)     Status: None (Preliminary result)   Collection Time: 10/02/18  9:02 PM   Specimen: BLOOD LEFT HAND  Result Value Ref Range Status   Specimen Description BLOOD LEFT HAND  Final   Special Requests   Final    BOTTLES DRAWN AEROBIC AND ANAEROBIC Blood Culture adequate volume   Culture   Final    NO GROWTH < 12 HOURS Performed at [facility], [place] [address], [place], [place] [address]    Report Status PENDING  Incomplete         Radiology Studies: No results found.      Scheduled Meds: . amLODipine  10 mg Oral Daily  . atorvastatin  40 mg Oral QHS  . carvedilol  12.5 mg Oral BID  . Chlorhexidine Gluconate Cloth  6 each Topical Daily  . ferrous sulfate  325 mg Oral Q breakfast  . gabapentin  300 mg Oral QHS  . insulin aspart  0-15 Units Subcutaneous TID WC  . insulin NPH Human  10 Units Subcutaneous BID AC & HS  . nicotine  21 mg Transdermal Daily  . polyethylene glycol  17 g Oral Daily  . senna-docusate  2 tablet Oral BID   Continuous Infusions: . sodium chloride 50 mL/hr at 10/03/18 1610  . sodium chloride    . heparin 1,900 Units/hr (10/04/18 0408)  . vancomycin 1,250 mg (10/03/18 2259)     LOS: 4 days   The patient is critically ill with multiple organ systems failure and requires high complexity decision making for assessment and support, frequent evaluation and titration of therapies, application of advanced monitoring technologies and extensive interpretation of multiple databases. Critical Care Time devoted to patient care services described in this note  Time  spent: 40 minutes     Trachelle Low, Geraldo Docker, MD [facility] Pager [phone]  If 7PM-7AM, please contact night-coverage [email or web address] Password [email or web address] 10/04/2018, 8:16 AM

## 2018-10-04 NOTE — Progress Notes (Signed)
Inpatient Rehab Admissions:  Inpatient Rehab Consult received.  Continue to await therapy recommendations.   Shann Medal, PT, DPT Admissions Coordinator [phone] 10/04/18  1:52 PM

## 2018-10-04 NOTE — Progress Notes (Signed)
OT Cancellation Note  Patient Details Name: Daniel Moon MRN: [id number] DOB: [birth year]   Cancelled Treatment:    Reason Eval/Treat Not Completed: Medical issues which prohibited therapy;Pain limiting ability to participate. Pt with subtherapeutic heparin with LV thrombus and complaining of increased pain. Will continue to follow.  [name] 10/04/2018, 1:24 PM  [name], OTR/L Acute Rehabilitation Services Pager: [phone] Office: [phone]

## 2018-10-04 NOTE — H&P (View-Only) (Signed)
[facility] for Infectious Disease  Date of Admission:  09/30/2018     Total days of antibiotics 6         ASSESSMENT/PLAN  Daniel Moon is now POD 3 from left BKA for chronic osteomyelitis complicated by MRSA bacteremia. Repeat blood cultures remain without growth to date. TEE is scheduled for tomorrow. Restarted anticoagulation for thrombus. Continue vancomycin with final recommendations for therapy pending TEE results. He will need prolonged therapy given MRSA bacteremia despite source control with BKA of at least 2 weeks and up to 6 weeks.   PLAN:  1. Continue Vancomycin per pharmacy protocol.  2. Await TEE results 3. Wound care per Vascular Surgery.  4. Monitor renal function for nephrotoxicity.   Principal Problem:   MRSA bacteremia Active Problems:   HTN (hypertension)   Diabetes ([place])   HLD (hyperlipidemia)   COPD (chronic obstructive pulmonary disease) (HCC)   Sepsis (HCC)   Open wound of left foot   Osteomyelitis (HCC)   Microcytic anemia   PAD (peripheral artery disease) (HCC)   Peripheral neuropathy   Cigarette smoker   S/P transmetatarsal amputation of foot, left (HCC)   Pressure injury of skin   Chronic renal insufficiency, stage 3 (moderate) (HCC)   Paroxysmal atrial fibrillation (HCC)   Unintentional weight loss   Poor dentition   . amLODipine  10 mg Oral Daily  . atorvastatin  40 mg Oral QHS  . carvedilol  12.5 mg Oral BID  . Chlorhexidine Gluconate Cloth  6 each Topical Daily  . ferrous sulfate  325 mg Oral Q breakfast  . gabapentin  300 mg Oral QHS  . insulin aspart  0-15 Units Subcutaneous TID WC  . insulin NPH Human  10 Units Subcutaneous BID AC & HS  . nicotine  21 mg Transdermal Daily  . polyethylene glycol  17 g Oral Daily  . senna-docusate  2 tablet Oral BID    SUBJECTIVE:  Afebrile overnight with no acute events/concerns. Resting upon entry with continued left lower extremity pain.   Allergies  Allergen Reactions  .  Amoxicillin-Pot Clavulanate Nausea And Vomiting    Did it involve swelling of the face/tongue/throat, SOB, or low BP? No Did it involve sudden or severe rash/hives, skin peeling, or any reaction on the inside of your mouth or nose? No Did you need to seek medical attention at a hospital or doctor's office? No When did it last happen?6 months ago If all above answers are "NO", may proceed with cephalosporin use.     Review of Systems: Review of Systems  Constitutional: Negative for chills, fever and weight loss.  Respiratory: Negative for cough, shortness of breath and wheezing.   Cardiovascular: Negative for chest pain and leg swelling.  Gastrointestinal: Negative for abdominal pain, constipation, diarrhea, nausea and vomiting.  Musculoskeletal:       Positive for left leg pain.   Skin: Negative for rash.    OBJECTIVE: Vitals:   10/03/18 0651 10/03/18 1805 10/03/18 2055 10/04/18 0408  BP:  125/86 (!) 137/94 123/80  Pulse: 85 85 88 77  Resp:  17 18 18   Temp:  98.2 F (36.8 C) 97.6 F (36.4 C) 97.6 F (36.4 C)  TempSrc:  Oral Oral Oral  SpO2: 98% 98% 95% 96%  Weight:      Height:       Body mass index is 22.57 kg/m.  Physical Exam Constitutional:      General: He is not in acute distress.  Appearance: He is well-developed.     Comments: Lying in bed with head of bed elevated; resting quietly.   Cardiovascular:     Rate and Rhythm: Normal rate and regular rhythm.     Heart sounds: Normal heart sounds.  Pulmonary:     Effort: Pulmonary effort is normal.     Breath sounds: Normal breath sounds.  Skin:    General: Skin is warm and dry.  Neurological:     Mental Status: He is alert and oriented to person, place, and time.     Lab Results Lab Results  Component Value Date   WBC 12.7 (H) 10/04/2018   HGB 7.4 (L) 10/04/2018   HCT 23.0 (L) 10/04/2018   MCV 70.8 (L) 10/04/2018   PLT 385 10/04/2018    Lab Results  Component Value Date   CREATININE 1.39 (H)  10/04/2018   BUN 19 10/04/2018   NA 134 (L) 10/04/2018   K 4.4 10/04/2018   CL 109 10/04/2018   CO2 17 (L) 10/04/2018    Lab Results  Component Value Date   ALT 8 09/30/2018   AST 12 (L) 09/30/2018   ALKPHOS 107 09/30/2018   BILITOT 1.1 09/30/2018     Microbiology: Recent Results (from the past 240 hour(s))  Culture, blood (Routine x 2)     Status: Abnormal   Collection Time: 09/30/18 12:54 AM   Specimen: BLOOD  Result Value Ref Range Status   Specimen Description   Final    BLOOD LEFT ANTECUBITAL Performed at [facility], [place] [address], [place], [place] [address]    Special Requests   Final    BOTTLES DRAWN AEROBIC AND ANAEROBIC Blood Culture results may not be optimal due to an inadequate volume of blood received in culture bottles Performed at [facility] [address], [place], [place] [address]    Culture  Setup Time   Final    GRAM POSITIVE COCCI IN CLUSTERS IN BOTH AEROBIC AND ANAEROBIC BOTTLES CRITICAL RESULT CALLED TO, READ BACK BY AND VERIFIED WITH: J. GRIMSLEY, PHARMD (WL) AT 2110 ON 09/30/18 BY C. JESSUP, MT.    Culture (A)  Final    STAPHYLOCOCCUS AUREUS SUSCEPTIBILITIES PERFORMED ON PREVIOUS CULTURE WITHIN THE LAST 5 DAYS. Performed at [facility], [place] [address], [place], [place] [address]    Report Status 10/02/2018 FINAL  Final  Culture, blood (Routine x 2)     Status: Abnormal   Collection Time: 09/30/18  1:30 AM   Specimen: BLOOD RIGHT FOREARM  Result Value Ref Range Status   Specimen Description   Final    BLOOD RIGHT FOREARM Performed at [facility] [address] [place], [place] [address]    Special Requests   Final    BOTTLES DRAWN AEROBIC AND ANAEROBIC Blood Culture adequate volume Performed at [facility] [address] [place], [place] [address]    Culture  Setup Time   Final    GRAM POSITIVE COCCI IN CLUSTERS IN BOTH AEROBIC AND ANAEROBIC  BOTTLES CRITICAL RESULT CALLED TO, READ BACK BY AND VERIFIED WITH: Lavell Luster, PHARMD (WL) AT 2110 ON 09/30/18 BY C. JESSUP, MT. Performed at [facility], [place] [address] [place], [place] [address]    Culture METHICILLIN RESISTANT STAPHYLOCOCCUS AUREUS (A)  Final   Report Status 10/02/2018 FINAL  Final   Organism ID, Bacteria METHICILLIN RESISTANT STAPHYLOCOCCUS AUREUS  Final      Susceptibility   Methicillin  resistant staphylococcus aureus - MIC*    CIPROFLOXACIN >=8 RESISTANT Resistant     ERYTHROMYCIN >=8 RESISTANT Resistant     GENTAMICIN <=0.5 SENSITIVE Sensitive     OXACILLIN >=4 RESISTANT Resistant     TETRACYCLINE <=1 SENSITIVE Sensitive     VANCOMYCIN 1 SENSITIVE Sensitive     TRIMETH/SULFA <=10 SENSITIVE Sensitive     CLINDAMYCIN <=0.25 SENSITIVE Sensitive     RIFAMPIN <=0.5 SENSITIVE Sensitive     Inducible Clindamycin NEGATIVE Sensitive     * METHICILLIN RESISTANT STAPHYLOCOCCUS AUREUS  Blood Culture ID Panel (Reflexed)     Status: Abnormal   Collection Time: 09/30/18  1:30 AM  Result Value Ref Range Status   Enterococcus species NOT DETECTED NOT DETECTED Final   Listeria monocytogenes NOT DETECTED NOT DETECTED Final   Staphylococcus species DETECTED (A) NOT DETECTED Final    Comment: CRITICAL RESULT CALLED TO, READ BACK BY AND VERIFIED WITH: J. GRIMSLEY, PHARMD (WL) AT 2110 ON 09/30/18 BY C. JESSUP, MT.    Staphylococcus aureus (BCID) DETECTED (A) NOT DETECTED Final    Comment: Methicillin (oxacillin)-resistant Staphylococcus aureus (MRSA). MRSA is predictably resistant to beta-lactam antibiotics (except ceftaroline). Preferred therapy is vancomycin unless clinically contraindicated. Patient requires contact precautions if  hospitalized. CRITICAL RESULT CALLED TO, READ BACK BY AND VERIFIED WITH: J. GRIMSLEY, PHARMD (WL) AT 2110 ON 09/30/18 BY C. JESSUP, MT.    Methicillin resistance DETECTED (A) NOT DETECTED Final    Comment: CRITICAL RESULT CALLED TO, READ BACK  BY AND VERIFIED WITH: J. GRIMSLEY, PHARMD (WL) AT 2110 ON 09/30/18 BY C. JESSUP, MT.    Streptococcus species NOT DETECTED NOT DETECTED Final   Streptococcus agalactiae NOT DETECTED NOT DETECTED Final   Streptococcus pneumoniae NOT DETECTED NOT DETECTED Final   Streptococcus pyogenes NOT DETECTED NOT DETECTED Final   Acinetobacter baumannii NOT DETECTED NOT DETECTED Final   Enterobacteriaceae species NOT DETECTED NOT DETECTED Final   Enterobacter cloacae complex NOT DETECTED NOT DETECTED Final   Escherichia coli NOT DETECTED NOT DETECTED Final   Klebsiella oxytoca NOT DETECTED NOT DETECTED Final   Klebsiella pneumoniae NOT DETECTED NOT DETECTED Final   Proteus species NOT DETECTED NOT DETECTED Final   Serratia marcescens NOT DETECTED NOT DETECTED Final   Haemophilus influenzae NOT DETECTED NOT DETECTED Final   Neisseria meningitidis NOT DETECTED NOT DETECTED Final   Pseudomonas aeruginosa NOT DETECTED NOT DETECTED Final   Candida albicans NOT DETECTED NOT DETECTED Final   Candida glabrata NOT DETECTED NOT DETECTED Final   Candida krusei NOT DETECTED NOT DETECTED Final   Candida parapsilosis NOT DETECTED NOT DETECTED Final   Candida tropicalis NOT DETECTED NOT DETECTED Final    Comment: Performed at [facility], [place]. [address] [place], [place] [address]  SARS Coronavirus 2 [facility] order, Performed in [facility] hospital lab) Nasopharyngeal Nasopharyngeal Swab     Status: None   Collection Time: 09/30/18  1:40 AM   Specimen: Nasopharyngeal Swab  Result Value Ref Range Status   SARS Coronavirus 2 NEGATIVE NEGATIVE Final    Comment: (NOTE) If result is NEGATIVE SARS-CoV-2 target nucleic acids are NOT DETECTED. The SARS-CoV-2 RNA is generally detectable in upper and lower  respiratory specimens during the acute phase of infection. The lowest  concentration of SARS-CoV-2 viral copies this assay can detect is 250  copies / mL. A negative result does not preclude SARS-CoV-2  infection  and should not be used as the sole basis for treatment or other  patient management  decisions.  A negative result may occur with  improper specimen collection / handling, submission of specimen other  than nasopharyngeal swab, presence of viral mutation(s) within the  areas targeted by this assay, and inadequate number of viral copies  (<250 copies / mL). A negative result must be combined with clinical  observations, patient history, and epidemiological information. If result is POSITIVE SARS-CoV-2 target nucleic acids are DETECTED. The SARS-CoV-2 RNA is generally detectable in upper and lower  respiratory specimens dur ing the acute phase of infection.  Positive  results are indicative of active infection with SARS-CoV-2.  Clinical  correlation with patient history and other diagnostic information is  necessary to determine patient infection status.  Positive results do  not rule out bacterial infection or co-infection with other viruses. If result is PRESUMPTIVE POSTIVE SARS-CoV-2 nucleic acids MAY BE PRESENT.   A presumptive positive result was obtained on the submitted specimen  and confirmed on repeat testing.  While 2019 novel coronavirus  (SARS-CoV-2) nucleic acids may be present in the submitted sample  additional confirmatory testing may be necessary for epidemiological  and / or clinical management purposes  to differentiate between  SARS-CoV-2 and other Sarbecovirus currently known to infect humans.  If clinically indicated additional testing with an alternate test  methodology [phone]) is advised. The SARS-CoV-2 RNA is generally  detectable in upper and lower respiratory sp ecimens during the acute  phase of infection. The expected result is Negative. Fact Sheet for Patients:  StrictlyIdeas.no Fact Sheet for Healthcare Providers: BankingDealers.co.za This test is not yet approved or cleared by the [place]  FDA and has been authorized for detection and/or diagnosis of SARS-CoV-2 by FDA under an Emergency Use Authorization (EUA).  This EUA will remain in effect (meaning this test can be used) for the duration of the COVID-19 declaration under Section 564(b)(1) of the Act, 21 U.S.C. section 360bbb-3(b)(1), unless the authorization is terminated or revoked sooner. Performed at [facility], [place] [address] [place], [place] [address]   MRSA PCR Screening     Status: None   Collection Time: 09/30/18  4:01 AM   Specimen: Nasal Mucosa; Nasopharyngeal  Result Value Ref Range Status   MRSA by PCR NEGATIVE NEGATIVE Final    Comment:        The GeneXpert MRSA Assay (FDA approved for NASAL specimens only), is one component of a comprehensive MRSA colonization surveillance program. It is not intended to diagnose MRSA infection nor to guide or monitor treatment for MRSA infections. Performed at [facility], [place] [address], [place], [place] [address]   Surgical pcr screen     Status: None   Collection Time: 10/02/18  7:31 AM   Specimen: Nasal Mucosa; Nasal Swab  Result Value Ref Range Status   MRSA, PCR NEGATIVE NEGATIVE Final   Staphylococcus aureus NEGATIVE NEGATIVE Final    Comment: (NOTE) The Xpert SA Assay (FDA approved for NASAL specimens in patients 62 years of age and older), is one component of a comprehensive surveillance program. It is not intended to diagnose infection nor to guide or monitor treatment. Performed at [facility], [place] [address] [place], [place] [address]   Culture, blood (routine x 2)     Status: None (Preliminary result)   Collection Time: 10/02/18  7:19 PM   Specimen: BLOOD LEFT HAND  Result Value Ref Range Status   Specimen Description BLOOD LEFT HAND  Final   Special Requests   Final  BOTTLES DRAWN AEROBIC AND ANAEROBIC Blood Culture results may not be optimal due to an inadequate volume of blood received in  culture bottles   Culture   Final    NO GROWTH < 24 HOURS Performed at [facility] [address], [place], [place] [address]    Report Status PENDING  Incomplete  Culture, blood (routine x 2)     Status: None (Preliminary result)   Collection Time: 10/02/18  9:02 PM   Specimen: BLOOD LEFT HAND  Result Value Ref Range Status   Specimen Description BLOOD LEFT HAND  Final   Special Requests   Final    BOTTLES DRAWN AEROBIC AND ANAEROBIC Blood Culture adequate volume   Culture   Final    NO GROWTH < 12 HOURS Performed at [facility], [place] [address], [place], [place] [address]    Report Status PENDING  Incomplete     Terri Piedra, [place] for [facility] [place] [phone] Pager  10/04/2018  10:51 AM

## 2018-10-04 NOTE — Progress Notes (Signed)
Pharmacy Antibiotic Note  Daniel Moon is a 58 y.o. male admitted on 09/30/2018 with Osteomyelitis.  Pharmacy has been consulted for Vancomycin dosing. Patient subsequently found to have MRSA bacteremia and LV thrombus. Will require extended course of vancomycin per ID. Levels to be obtained with doses today.  Plan: Vancomycin 1250 mg IV Q 24 hrs. Goal AUC 400-550. Expected AUC: 509 SCr used: 1.78 F/u levels tonight.  F/u LOT and OPAT orders   Height: 6\' 3"  (190.5 cm) Weight: 180 lb 8.9 oz (81.9 kg) IBW/kg (Calculated) : 84.5  Temp (24hrs), Avg:97.8 F (36.6 C), Min:97.6 F (36.4 C), Max:98.2 F (36.8 C)  Recent Labs  Lab 09/30/18 0054 10/01/18 0236 10/03/18 0913 10/04/18 0253  WBC 25.0* 15.8* 13.3* 12.7*  CREATININE 1.42* 1.78* 1.73* 1.39*  LATICACIDVEN 1.4  --   --   --   VANCOPEAK  --   --   --  46*    Estimated Creatinine Clearance: 67.9 mL/min (A) (by C-G formula based on SCr of 1.39 mg/dL (H)).    Allergies  Allergen Reactions  . Amoxicillin-Pot Clavulanate Nausea And Vomiting    Did it involve swelling of the face/tongue/throat, SOB, or low BP? No Did it involve sudden or severe rash/hives, skin peeling, or any reaction on the inside of your mouth or nose? No Did you need to seek medical attention at a hospital or doctor's office? No When did it last happen?6 months ago If all above answers are "NO", may proceed with cephalosporin use.    Antimicrobials this admission: Vancomycin 10/04/2018 >> Zosyn 10/04/2018 >>   Dose adjustments this admission: -Decrease 1750mg  q24h to 1250mg  q24h  Microbiology results: -Blood Cxs MRSA  Xian Alves A. Levada Dy, PharmD, BCPS, FNKF Clinical Pharmacist [place] Please utilize [facility] for appropriate phone number to reach the unit pharmacist ([place])   10/04/2018 12:28 PM

## 2018-10-04 NOTE — Plan of Care (Signed)
  Problem: Pain Managment: Goal: General experience of comfort will improve Outcome: Progressing   Problem: Skin Integrity: Goal: Risk for impaired skin integrity will decrease Outcome: Progressing   Problem: Safety: Goal: Ability to remain free from injury will improve Outcome: Progressing   

## 2018-10-04 NOTE — Progress Notes (Addendum)
Pt c/o severe pain to left leg. Just had Morphine 4mg  at 0508 Offered to give Percocet but pt refused states "that Percocet does not help, I want Methadone" Called on call K Schorr,NP. Received new order to give Morphine 2mg  x1. Also K. Schorr,NP states give Percocet first then give Morphine. But pt adamantly refused Percocet as well as Morphine. @0631  Pt called back states he will take the Percocet and Morphine.

## 2018-10-04 NOTE — Progress Notes (Signed)
   VASCULAR SURGERY ASSESSMENT & PLAN:   POD 2 Left BKA:  His left below the knee amputation site looks fine without erythema or drainage.  I have written for daily dressing changes.ANTICOAGULATION: He is back on heparin for his ventricular thrombus.  ID: He is on Zosyn and vancomycin.  From my standpoint this can be discontinued after today's doses.   SUBJECTIVE:   Pain under much better control.  PHYSICAL EXAM:   Vitals:   10/03/18 0651 10/03/18 1805 10/03/18 2055 10/04/18 0408  BP:  125/86 (!) 137/94 123/80  Pulse: 85 85 88 77  Resp:  17 18 18   Temp:  98.2 F (36.8 C) 97.6 F (36.4 C) 97.6 F (36.4 C)  TempSrc:  Oral Oral Oral  SpO2: 98% 98% 95% 96%  Weight:      Height:       His left below the knee amputation site was inspected and looks fine.  LABS:   Lab Results  Component Value Date   WBC 12.7 (H) 10/04/2018   HGB 7.4 (L) 10/04/2018   HCT 23.0 (L) 10/04/2018   MCV 70.8 (L) 10/04/2018   PLT 385 10/04/2018   Lab Results  Component Value Date   CREATININE 1.39 (H) 10/04/2018   Lab Results  Component Value Date   INR 1.4 (H) 09/30/2018   CBG (last 3)  Recent Labs    10/03/18 1716 10/03/18 2102 10/04/18 0821  GLUCAP 133* 161* 92    PROBLEM LIST:    Principal Problem:   MRSA bacteremia Active Problems:   HTN (hypertension)   Diabetes ([place])   HLD (hyperlipidemia)   COPD (chronic obstructive pulmonary disease) (HCC)   Sepsis ([place])   Open wound of left foot   Osteomyelitis (HCC)   Microcytic anemia   PAD (peripheral artery disease) (HCC)   Peripheral neuropathy   Cigarette smoker   S/P transmetatarsal amputation of foot, left (HCC)   Pressure injury of skin   Chronic renal insufficiency, stage 3 (moderate) (HCC)   Paroxysmal atrial fibrillation (HCC)   Unintentional weight loss   Poor dentition   CURRENT MEDS:   . amLODipine  10 mg Oral Daily  . atorvastatin  40 mg Oral QHS  . carvedilol  12.5 mg Oral BID  . Chlorhexidine Gluconate  Cloth  6 each Topical Daily  . ferrous sulfate  325 mg Oral Q breakfast  . gabapentin  300 mg Oral QHS  . insulin aspart  0-15 Units Subcutaneous TID WC  . insulin NPH Human  10 Units Subcutaneous BID AC & HS  . nicotine  21 mg Transdermal Daily  . polyethylene glycol  17 g Oral Daily  . senna-docusate  2 tablet Oral BID    Deitra Mayo Beeper: [phone] Office: [phone] 10/04/2018

## 2018-10-04 NOTE — Progress Notes (Signed)
ANTICOAGULATION CONSULT NOTE - Follow Up Consult  Pharmacy Consult for Heparin Indication: LV thrombus  Allergies  Allergen Reactions  . Amoxicillin-Pot Clavulanate Nausea And Vomiting    Did it involve swelling of the face/tongue/throat, SOB, or low BP? No Did it involve sudden or severe rash/hives, skin peeling, or any reaction on the inside of your mouth or nose? No Did you need to seek medical attention at a hospital or doctor's office? No When did it last happen?6 months ago If all above answers are "NO", may proceed with cephalosporin use.    Patient Measurements: Height: 6\' 3"  (190.5 cm) Weight: 180 lb 8.9 oz (81.9 kg) IBW/kg (Calculated) : 84.5 Heparin Dosing Weight: 81.9 kg  Vital Signs: Temp: 97.6 F (36.4 C) (09/16 2055) Temp Source: Oral (09/16 2055) BP: 137/94 (09/16 2055) Pulse Rate: 88 (09/16 2055)  Labs: Recent Labs    10/03/18 0913 10/03/18 1853 10/04/18 0253  HGB 8.0*  --  7.4*  HCT 26.0*  --  23.0*  PLT 392  --  385  HEPARINUNFRC <0.10* <0.10* 0.12*  CREATININE 1.73*  --   --     Estimated Creatinine Clearance: 54.6 mL/min (A) (by C-G formula based on SCr of 1.73 mg/dL (H)).  Assessment:  58 yr old male started on IV heparin for LV thrombus.  S/p left BKA and heparin resumed post-op.  Heparin level subtherapeutic (0.12) on gtt at 1650 units/hr. No issues with line or bleeding reported per RN. Hgb down to 7.4.    Goal of Therapy:  Heparin level 0.3-0.7 units/ml Monitor platelets by anticoagulation protocol: Yes   Plan:   Increase heparin drip to 1900 units/hr, no bolus.  Heparin level 6 hrs after rate increase.  Daily heparin level and CBC.  Thank you, Sherlon Handing, PharmD, BCPS 10/04/2018,3:28 AM

## 2018-10-04 NOTE — Progress Notes (Signed)
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 Repeat blood cultures remain without growth to date. TEE is scheduled for tomorrow. Restarted anticoagulation for thrombus. Continue vancomycin with final recommendations for therapy pending TEE results. He will need prolonged therapy given MRSA bacteremia despite source control with BKA of at least 2 weeks and up to 6 weeks.   PLAN:  1. Continue Vancomycin per pharmacy protocol.  2. Await TEE results 3. Wound care per Vascular Surgery.  4. Monitor renal function for nephrotoxicity.   Principal Problem:   MRSA bacteremia Active Problems:   HTN (hypertension)   Diabetes ([place])   HLD (hyperlipidemia)   COPD (chronic obstructive pulmonary disease) (HCC)   Sepsis (HCC)   Open wound of left foot   Osteomyelitis (HCC)   Microcytic anemia   PAD (peripheral artery disease) (HCC)   Peripheral neuropathy   Cigarette smoker   S/P transmetatarsal amputation of foot, left (HCC)   Pressure injury of skin   Chronic renal insufficiency, stage 3 (moderate) (HCC)   Paroxysmal atrial fibrillation (HCC)   Unintentional weight loss   Poor dentition   . amLODipine  10 mg Oral Daily  . atorvastatin  40 mg Oral QHS  . carvedilol  12.5 mg Oral BID  . Chlorhexidine Gluconate Cloth  6 each Topical Daily  . ferrous sulfate  325 mg Oral Q breakfast  . gabapentin  300 mg Oral QHS  . insulin aspart  0-15 Units Subcutaneous TID WC  . insulin NPH Human  10 Units Subcutaneous BID AC & HS  . nicotine  21 mg Transdermal Daily  . polyethylene glycol  17 g Oral Daily  . senna-docusate  2 tablet Oral BID    SUBJECTIVE:  Afebrile overnight with no acute events/concerns. Resting upon entry with continued left lower extremity pain.   Allergies  Allergen Reactions  .  Amoxicillin-Pot Clavulanate Nausea And Vomiting    Did it involve swelling of the face/tongue/throat, SOB, or low BP? No Did it involve sudden or severe rash/hives, skin peeling, or any reaction on the inside of your mouth or nose? No Did you need to seek medical attention at a hospital or doctor's office? No When did it last happen?6 months ago If all above answers are "NO", may proceed with cephalosporin use.     Review of Systems: Review of Systems  Constitutional: Negative for chills, fever and weight loss.  Respiratory: Negative for cough, shortness of breath and wheezing.   Cardiovascular: Negative for chest pain and leg swelling.  Gastrointestinal: Negative for abdominal pain, constipation, diarrhea, nausea and vomiting.  Musculoskeletal:       Positive for left leg pain.   Skin: Negative for rash.    OBJECTIVE: Vitals:   10/03/18 0651 10/03/18 1805 10/03/18 2055 10/04/18 0408  BP:  125/86 (!) 137/94 123/80  Pulse: 85 85 88 77  Resp:  17 18 18   Temp:  98.2 F (36.8 C) 97.6 F (36.4 C) 97.6 F (36.4 C)  TempSrc:  Oral Oral Oral  SpO2: 98% 98% 95% 96%  Weight:      Height:       Body mass index is 22.57 kg/m.  Physical Exam Constitutional:      General: He is not in acute distress.  Appearance: He is well-developed.     Comments: Lying in bed with head of bed elevated; resting quietly.   Cardiovascular:     Rate and Rhythm: Normal rate and regular rhythm.     Heart sounds: Normal heart sounds.  Pulmonary:     Effort: Pulmonary effort is normal.     Breath sounds: Normal breath sounds.  Skin:    General: Skin is warm and dry.  Neurological:     Mental Status: He is alert and oriented to person, place, and time.     Lab Results Lab Results  Component Value Date   WBC 12.7 (H) 10/04/2018   HGB 7.4 (L) 10/04/2018   HCT 23.0 (L) 10/04/2018   MCV 70.8 (L) 10/04/2018   PLT 385 10/04/2018    Lab Results  Component Value Date   CREATININE 1.39 (H)  10/04/2018   BUN 19 10/04/2018   NA 134 (L) 10/04/2018   K 4.4 10/04/2018   CL 109 10/04/2018   CO2 17 (L) 10/04/2018    Lab Results  Component Value Date   ALT 8 09/30/2018   AST 12 (L) 09/30/2018   ALKPHOS 107 09/30/2018   BILITOT 1.1 09/30/2018     Microbiology: Recent Results (from the past 240 hour(s))  Culture, blood (Routine x 2)     Status: Abnormal   Collection Time: 09/30/18 12:54 AM   Specimen: BLOOD  Result Value Ref Range Status   Specimen Description   Final    BLOOD LEFT ANTECUBITAL Performed at [facility], [place] [address], [place], [place] [address]    Special Requests   Final    BOTTLES DRAWN AEROBIC AND ANAEROBIC Blood Culture results may not be optimal due to an inadequate volume of blood received in culture bottles Performed at [facility] [address] [place] [address], [place], [place] [address]    Culture  Setup Time   Final    GRAM POSITIVE COCCI IN CLUSTERS IN BOTH AEROBIC AND ANAEROBIC BOTTLES CRITICAL RESULT CALLED TO, READ BACK BY AND VERIFIED WITH: J. GRIMSLEY, PHARMD (WL) AT 2110 ON 09/30/18 BY C. JESSUP, MT.    Culture (A)  Final    STAPHYLOCOCCUS AUREUS SUSCEPTIBILITIES PERFORMED ON PREVIOUS CULTURE WITHIN THE LAST 5 DAYS. Performed at [facility], [place] [address], [place], [place] [address]    Report Status 10/02/2018 FINAL  Final  Culture, blood (Routine x 2)     Status: Abnormal   Collection Time: 09/30/18  1:30 AM   Specimen: BLOOD RIGHT FOREARM  Result Value Ref Range Status   Specimen Description   Final    BLOOD RIGHT FOREARM Performed at [facility] [address], [place], [place] [address]    Special Requests   Final    BOTTLES DRAWN AEROBIC AND ANAEROBIC Blood Culture adequate volume Performed at [facility] [address], [place], [place] [address]    Culture  Setup Time   Final    GRAM POSITIVE COCCI IN CLUSTERS IN BOTH AEROBIC AND ANAEROBIC  BOTTLES CRITICAL RESULT CALLED TO, READ BACK BY AND VERIFIED WITH: Lavell Luster, PHARMD (WL) AT 2110 ON 09/30/18 BY C. JESSUP, MT. Performed at [facility], [place] [address], [place], [place] [address]    Culture METHICILLIN RESISTANT STAPHYLOCOCCUS AUREUS (A)  Final   Report Status 10/02/2018 FINAL  Final   Organism ID, Bacteria METHICILLIN RESISTANT STAPHYLOCOCCUS AUREUS  Final      Susceptibility   Methicillin  resistant staphylococcus aureus - MIC*    CIPROFLOXACIN >=8 RESISTANT Resistant     ERYTHROMYCIN >=8 RESISTANT Resistant     GENTAMICIN <=0.5 SENSITIVE Sensitive     OXACILLIN >=4 RESISTANT Resistant     TETRACYCLINE <=1 SENSITIVE Sensitive     VANCOMYCIN 1 SENSITIVE Sensitive     TRIMETH/SULFA <=10 SENSITIVE Sensitive     CLINDAMYCIN <=0.25 SENSITIVE Sensitive     RIFAMPIN <=0.5 SENSITIVE Sensitive     Inducible Clindamycin NEGATIVE Sensitive     * METHICILLIN RESISTANT STAPHYLOCOCCUS AUREUS  Blood Culture ID Panel (Reflexed)     Status: Abnormal   Collection Time: 09/30/18  1:30 AM  Result Value Ref Range Status   Enterococcus species NOT DETECTED NOT DETECTED Final   Listeria monocytogenes NOT DETECTED NOT DETECTED Final   Staphylococcus species DETECTED (A) NOT DETECTED Final    Comment: CRITICAL RESULT CALLED TO, READ BACK BY AND VERIFIED WITH: J. GRIMSLEY, PHARMD (WL) AT 2110 ON 09/30/18 BY C. JESSUP, MT.    Staphylococcus aureus (BCID) DETECTED (A) NOT DETECTED Final    Comment: Methicillin (oxacillin)-resistant Staphylococcus aureus (MRSA). MRSA is predictably resistant to beta-lactam antibiotics (except ceftaroline). Preferred therapy is vancomycin unless clinically contraindicated. Patient requires contact precautions if  hospitalized. CRITICAL RESULT CALLED TO, READ BACK BY AND VERIFIED WITH: J. GRIMSLEY, PHARMD (WL) AT 2110 ON 09/30/18 BY C. JESSUP, MT.    Methicillin resistance DETECTED (A) NOT DETECTED Final    Comment: CRITICAL RESULT CALLED TO, READ BACK  BY AND VERIFIED WITH: J. GRIMSLEY, PHARMD (WL) AT 2110 ON 09/30/18 BY C. JESSUP, MT.    Streptococcus species NOT DETECTED NOT DETECTED Final   Streptococcus agalactiae NOT DETECTED NOT DETECTED Final   Streptococcus pneumoniae NOT DETECTED NOT DETECTED Final   Streptococcus pyogenes NOT DETECTED NOT DETECTED Final   Acinetobacter baumannii NOT DETECTED NOT DETECTED Final   Enterobacteriaceae species NOT DETECTED NOT DETECTED Final   Enterobacter cloacae complex NOT DETECTED NOT DETECTED Final   Escherichia coli NOT DETECTED NOT DETECTED Final   Klebsiella oxytoca NOT DETECTED NOT DETECTED Final   Klebsiella pneumoniae NOT DETECTED NOT DETECTED Final   Proteus species NOT DETECTED NOT DETECTED Final   Serratia marcescens NOT DETECTED NOT DETECTED Final   Haemophilus influenzae NOT DETECTED NOT DETECTED Final   Neisseria meningitidis NOT DETECTED NOT DETECTED Final   Pseudomonas aeruginosa NOT DETECTED NOT DETECTED Final   Candida albicans NOT DETECTED NOT DETECTED Final   Candida glabrata NOT DETECTED NOT DETECTED Final   Candida krusei NOT DETECTED NOT DETECTED Final   Candida parapsilosis NOT DETECTED NOT DETECTED Final   Candida tropicalis NOT DETECTED NOT DETECTED Final    Comment: Performed at [facility], [place]. [address] [place], [place] [address]  SARS Coronavirus 2 [facility] order, Performed in [facility] hospital lab) Nasopharyngeal Nasopharyngeal Swab     Status: None   Collection Time: 09/30/18  1:40 AM   Specimen: Nasopharyngeal Swab  Result Value Ref Range Status   SARS Coronavirus 2 NEGATIVE NEGATIVE Final    Comment: (NOTE) If result is NEGATIVE SARS-CoV-2 target nucleic acids are NOT DETECTED. The SARS-CoV-2 RNA is generally detectable in upper and lower  respiratory specimens during the acute phase of infection. The lowest  concentration of SARS-CoV-2 viral copies this assay can detect is 250  copies / mL. A negative result does not preclude SARS-CoV-2  infection  and should not be used as the sole basis for treatment or other  patient management  decisions.  A negative result may occur with  improper specimen collection / handling, submission of specimen other  than nasopharyngeal swab, presence of viral mutation(s) within the  areas targeted by this assay, and inadequate number of viral copies  (<250 copies / mL). A negative result must be combined with clinical  observations, patient history, and epidemiological information. If result is POSITIVE SARS-CoV-2 target nucleic acids are DETECTED. The SARS-CoV-2 RNA is generally detectable in upper and lower  respiratory specimens dur ing the acute phase of infection.  Positive  results are indicative of active infection with SARS-CoV-2.  Clinical  correlation with patient history and other diagnostic information is  necessary to determine patient infection status.  Positive results do  not rule out bacterial infection or co-infection with other viruses. If result is PRESUMPTIVE POSTIVE SARS-CoV-2 nucleic acids MAY BE PRESENT.   A presumptive positive result was obtained on the submitted specimen  and confirmed on repeat testing.  While 2019 novel coronavirus  (SARS-CoV-2) nucleic acids may be present in the submitted sample  additional confirmatory testing may be necessary for epidemiological  and / or clinical management purposes  to differentiate between  SARS-CoV-2 and other Sarbecovirus currently known to infect humans.  If clinically indicated additional testing with an alternate test  methodology [phone]) is advised. The SARS-CoV-2 RNA is generally  detectable in upper and lower respiratory sp ecimens during the acute  phase of infection. The expected result is Negative. Fact Sheet for Patients:  StrictlyIdeas.no Fact Sheet for Healthcare Providers: BankingDealers.co.za This test is not yet approved or cleared by the [place]  FDA and has been authorized for detection and/or diagnosis of SARS-CoV-2 by FDA under an Emergency Use Authorization (EUA).  This EUA will remain in effect (meaning this test can be used) for the duration of the COVID-19 declaration under Section 564(b)(1) of the Act, 21 U.S.C. section 360bbb-3(b)(1), unless the authorization is terminated or revoked sooner. Performed at [facility], [place] [address] [place], [place] [address]   MRSA PCR Screening     Status: None   Collection Time: 09/30/18  4:01 AM   Specimen: Nasal Mucosa; Nasopharyngeal  Result Value Ref Range Status   MRSA by PCR NEGATIVE NEGATIVE Final    Comment:        The GeneXpert MRSA Assay (FDA approved for NASAL specimens only), is one component of a comprehensive MRSA colonization surveillance program. It is not intended to diagnose MRSA infection nor to guide or monitor treatment for MRSA infections. Performed at [facility], [place] [address], [place], [place] [address]   Surgical pcr screen     Status: None   Collection Time: 10/02/18  7:31 AM   Specimen: Nasal Mucosa; Nasal Swab  Result Value Ref Range Status   MRSA, PCR NEGATIVE NEGATIVE Final   Staphylococcus aureus NEGATIVE NEGATIVE Final    Comment: (NOTE) The Xpert SA Assay (FDA approved for NASAL specimens in patients 56 years of age and older), is one component of a comprehensive surveillance program. It is not intended to diagnose infection nor to guide or monitor treatment. Performed at [facility], [place] [address], [place], [place] [address]   Culture, blood (routine x 2)     Status: None (Preliminary result)   Collection Time: 10/02/18  7:19 PM   Specimen: BLOOD LEFT HAND  Result Value Ref Range Status   Specimen Description BLOOD LEFT HAND  Final   Special Requests   Final  BOTTLES DRAWN AEROBIC AND ANAEROBIC Blood Culture results may not be optimal due to an inadequate volume of blood received in  culture bottles   Culture   Final    NO GROWTH < 24 HOURS Performed at [facility] [address], [place], [place] [address]    Report Status PENDING  Incomplete  Culture, blood (routine x 2)     Status: None (Preliminary result)   Collection Time: 10/02/18  9:02 PM   Specimen: BLOOD LEFT HAND  Result Value Ref Range Status   Specimen Description BLOOD LEFT HAND  Final   Special Requests   Final    BOTTLES DRAWN AEROBIC AND ANAEROBIC Blood Culture adequate volume   Culture   Final    NO GROWTH < 12 HOURS Performed at [facility], [place] [address], [place],  [address]    Report Status PENDING  Incomplete     Terri Piedra, [place] [facility] [phone] Pager  10/04/2018  10:51 AM

## 2018-10-04 NOTE — Progress Notes (Signed)
Pharmacy Antibiotic Note  Daniel Earnst is a 58 y.o. male admitted on 09/30/2018 with MRSA bacteremia and osteomyelitis.  Pharmacy has been consulted for vancomycin dosing.  AUC is well above goal at 915.  SCr has improved.  Plan: Change vancomycin to 1250mg  IV Q48H; of note ~1/3 of tonight's dose was given prior to labs and calculation, now stopped after communication w/ RN.  Height: 6\' 3"  (190.5 cm) Weight: 180 lb 8.9 oz (81.9 kg) IBW/kg (Calculated) : 84.5  Temp (24hrs), Avg:97.8 F (36.6 C), Min:97.6 F (36.4 C), Max:98 F (36.7 C)  Recent Labs  Lab 09/30/18 0054 10/01/18 0236 10/03/18 0913 10/04/18 0253 10/04/18 2310  WBC 25.0* 15.8* 13.3* 12.7*  --   CREATININE 1.42* 1.78* 1.73* 1.39*  --   LATICACIDVEN 1.4  --   --   --   --   VANCOTROUGH  --   --   --   --  29*  VANCOPEAK  --   --   --  46*  --     Estimated Creatinine Clearance: 67.9 mL/min (A) (by C-G formula based on SCr of 1.39 mg/dL (H)).    Allergies  Allergen Reactions  . Amoxicillin-Pot Clavulanate Nausea And Vomiting    Did it involve swelling of the face/tongue/throat, SOB, or low BP? No Did it involve sudden or severe rash/hives, skin peeling, or any reaction on the inside of your mouth or nose? No Did you need to seek medical attention at a hospital or doctor's office? No When did it last happen?6 months ago If all above answers are "NO", may proceed with cephalosporin use.     Thank you for allowing pharmacy to be a part of this patient's care.  Wynona Neat, PharmD, BCPS  10/04/2018 11:54 PM

## 2018-10-05 ENCOUNTER — Inpatient Hospital Stay (HOSPITAL_COMMUNITY): Payer: Medicaid Other | Admitting: Anesthesiology

## 2018-10-05 ENCOUNTER — Inpatient Hospital Stay (HOSPITAL_COMMUNITY): Payer: Medicaid Other

## 2018-10-05 ENCOUNTER — Other Ambulatory Visit: Payer: Self-pay | Admitting: Family

## 2018-10-05 ENCOUNTER — Encounter (HOSPITAL_COMMUNITY)
Admission: EM | Disposition: A | Discharge status: Home Health | Payer: Self-pay | Source: Home / Self Care | Attending: Internal Medicine

## 2018-10-05 ENCOUNTER — Encounter (HOSPITAL_COMMUNITY): Payer: Self-pay | Admitting: *Deleted

## 2018-10-05 DIAGNOSIS — R7881 Bacteremia: Secondary | ICD-10-CM

## 2018-10-05 DIAGNOSIS — I272 Pulmonary hypertension, unspecified: Secondary | ICD-10-CM

## 2018-10-05 DIAGNOSIS — E872 Acidosis: Secondary | ICD-10-CM

## 2018-10-05 DIAGNOSIS — I739 Peripheral vascular disease, unspecified: Secondary | ICD-10-CM

## 2018-10-05 DIAGNOSIS — A419 Sepsis, unspecified organism: Secondary | ICD-10-CM

## 2018-10-05 HISTORY — PX: TEE WITHOUT CARDIOVERSION: SHX5443

## 2018-10-05 LAB — BASIC METABOLIC PANEL
Anion gap: 8 (ref 5–15)
BUN: 18 mg/dL (ref 6–20)
CO2: 17 mmol/L — ABNORMAL LOW (ref 22–32)
Calcium: 7.7 mg/dL — ABNORMAL LOW (ref 8.9–10.3)
Chloride: 110 mmol/L (ref 98–111)
Creatinine, Ser: 1.55 mg/dL — ABNORMAL HIGH (ref 0.61–1.24)
GFR calc Af Amer: 57 mL/min — ABNORMAL LOW (ref >60)
GFR calc non Af Amer: 49 mL/min — ABNORMAL LOW (ref >60)
Glucose, Bld: 76 mg/dL (ref 70–99)
Potassium: 4.5 mmol/L (ref 3.5–5.1)
Sodium: 135 mmol/L (ref 135–145)

## 2018-10-05 LAB — CBC
HCT: 24.9 % — ABNORMAL LOW (ref 39.0–52.0)
Hemoglobin: 8.2 g/dL — ABNORMAL LOW (ref 13.0–17.0)
MCH: 23.3 pg — ABNORMAL LOW (ref 26.0–34.0)
MCHC: 32.9 g/dL (ref 30.0–36.0)
MCV: 70.7 fL — ABNORMAL LOW (ref 80.0–100.0)
Platelets: 272 10*3/uL (ref 150–400)
RBC: 3.52 MIL/uL — ABNORMAL LOW (ref 4.22–5.81)
RDW: 23 % — ABNORMAL HIGH (ref 11.5–15.5)
WBC: 11.4 10*3/uL — ABNORMAL HIGH (ref 4.0–10.5)
nRBC: 0 % (ref 0.0–0.2)

## 2018-10-05 LAB — GLUCOSE, CAPILLARY
Glucose-Capillary: 94 mg/dL (ref 70–99)
Glucose-Capillary: 81 mg/dL (ref 70–99)
Glucose-Capillary: 144 mg/dL — ABNORMAL HIGH (ref 70–99)
Glucose-Capillary: 192 mg/dL — ABNORMAL HIGH (ref 70–99)

## 2018-10-05 LAB — SURGICAL PATHOLOGY

## 2018-10-05 LAB — HEPARIN LEVEL (UNFRACTIONATED)
Heparin Unfractionated: 0.27 IU/mL — ABNORMAL LOW (ref 0.30–0.70)
Heparin Unfractionated: 0.43 IU/mL (ref 0.30–0.70)
Heparin Unfractionated: 0.52 IU/mL (ref 0.30–0.70)

## 2018-10-05 LAB — MAGNESIUM: Magnesium: 1.8 mg/dL (ref 1.7–2.4)

## 2018-10-05 SURGERY — ECHOCARDIOGRAM, TRANSESOPHAGEAL
Anesthesia: General

## 2018-10-05 MED ORDER — VANCOMYCIN HCL 10 G IV SOLR
1250.0000 mg | INTRAVENOUS | Status: DC
  Administered 2018-10-06 – 2018-10-08 (×2): 1250 mg via INTRAVENOUS
  Filled 2018-10-05 (×2): qty 1250

## 2018-10-05 MED ORDER — PHENYLEPHRINE 40 MCG/ML (10ML) SYRINGE FOR IV PUSH (FOR BLOOD PRESSURE SUPPORT)
PREFILLED_SYRINGE | INTRAVENOUS | Status: DC | PRN
  Administered 2018-10-05: 80 ug via INTRAVENOUS

## 2018-10-05 MED ORDER — SODIUM BICARBONATE-DEXTROSE 150-5 MEQ/L-% IV SOLN
150.0000 meq | INTRAVENOUS | Status: DC
  Administered 2018-10-05 – 2018-10-10 (×6): 150 meq via INTRAVENOUS
  Filled 2018-10-05 (×8): qty 1000

## 2018-10-05 MED ORDER — PROPOFOL 10 MG/ML IV BOLUS
INTRAVENOUS | Status: DC | PRN
  Administered 2018-10-05 (×2): 20 mg via INTRAVENOUS

## 2018-10-05 MED ORDER — PROPOFOL 500 MG/50ML IV EMUL
INTRAVENOUS | Status: DC | PRN
  Administered 2018-10-05: 75 ug/kg/min via INTRAVENOUS

## 2018-10-05 MED ORDER — SODIUM CHLORIDE 0.9 % IV SOLN: INTRAVENOUS | Status: DC

## 2018-10-05 MED ORDER — BUTAMBEN-TETRACAINE-BENZOCAINE 2-2-14 % EX AERO
INHALATION_SPRAY | CUTANEOUS | Status: DC | PRN
  Administered 2018-10-05: 2 via TOPICAL

## 2018-10-05 MED ORDER — LINEZOLID 600 MG PO TABS: 600.0000 mg | ORAL_TABLET | Freq: Two times a day (BID) | ORAL | 0 refills | Status: DC

## 2018-10-05 MED FILL — LINEZOLID 600 MG TABS: 600 | 14 days supply | Qty: 28 | Fill #0

## 2018-10-05 NOTE — Progress Notes (Signed)
  Echocardiogram Echocardiogram Transesophageal has been performed.  Daniel Moon 10/05/2018, 12:23 PM

## 2018-10-05 NOTE — Progress Notes (Signed)
PROGRESS NOTE    Comer Buyer  [id number] DOB: 09/18/1960 DOA: [id number] PCP: Sanjuana Letters, MD   Brief Narrative:  Daniel Moon is a 58 y.o. BM PMHx DM type II uncontrolled with complication, DM peripheral neuropathy, nonhealing left transmetatarsal amputation wound and limb threatening ischemia   Admitted with sepsis likely due to osteomyelitis. The patient is a poor historian but tells me he started having fevers today at home. He says he was feeling fine until today when he says his family heard him wake up yelling in his sleep. He was found to have a fever and his family called EMS. He last had balloon angioplasty in July and says he was given antibiotics after that procedure but didn't take them since they made him feel sick. Since that time, he has not seen a physician.   ED Course: He was found to be febrile, have a leukocytosis and XR of foot shows evidence of osteomyelitis. He was given IVF but gently due to his history of CHF, and his blood pressure is stable. He was given empiric vancomycin and zosyn.   Subjective: 9/18 A/O x4, patient much more pleasant today.  States pain now well controlled.    Assessment & Plan:   Principal Problem:   MRSA bacteremia Active Problems:   HTN (hypertension)   Diabetes ([name])   HLD (hyperlipidemia)   COPD (chronic obstructive pulmonary disease) ([name])   Sepsis ([name])   Open wound of left foot   Osteomyelitis (H[facility])   Microcytic anemia   PAD (peripheral artery disease) (H[facility])   Peripheral neuropathy   Cigarette smoker   S/P transmetatarsal amputation of foot, left (HCC)   Pressure injury of skin   Chronic renal insufficiency, stage 3 (moderate) (HCC)   Paroxysmal atrial fibrillation (H[facility])   Unintentional weight loss   Poor dentition  Sepsis secondary to LEFT lower extremity osteomyelitis/MRSA bacteremia -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 - Poorly controlled pain - Continue antibiotics per orthopedic surgery - 9/13 blood cultures  positive MRSA - Repeat blood cultures 9/15 and 9/16 pending - Hold on PICC line until new cultures, finalized or patient afebrile  LEFT ventricular thrombus insetting proximal atrial fibrillation/medication noncompliance - Continue heparin drip - 9/18 discussed case with cardiology watchman vs DOAC  Acute on CKD stage II (baseline Cr 1.4) Recent Labs  Lab 09/30/18 0054 10/01/18 0236 10/03/18 0913 10/04/18 0253 10/05/18 0323  CREATININE 1.42* 1.78* 1.73* 1.39* 1.55*  - At baseline  Hyperkalemia -Potassium previously 5.4 no EKG changes -Resolved  Metabolic acidosis -[id number] sodium bicarb 50 ml/hr  Iron deficiency anemia/anemia of chronic disease -Presented hemoglobin 6.9  - Appears multiple transfusions -Iron studies done on 09/30/2018 significant for iron deficiency -Hold off IV iron supplement in the setting of active infective process -Start p.o. ferrous sulfate 325 daily  Peripheral vascular disease ABI ordered and pending Hold off Plavix given need for full dose anticoagulation, could consider 81 aspirin at discharge  Chronic systolic CHF -EF 20 to [id number] -Strict in and out +6.8 L -Daily weight Filed Weights   09/30/18 0606 10/01/18 1930 10/05/18 1118  Weight: 75.6 kg 81.9 kg 81.9 kg  -Seen at [facility] -TTE remarkable for 2.6 x 2 cm LV thrombus - On heparin drip.  Next week will need to discuss with patient and NCM what medication patient can afford for long-term anticoagulation  Diabetes type 2 uncontrolled with complication -[id number] hemoglobin A1c= 8.4 - Moderate SSI -Lipid panel pending  Noncompliance medication - We will need to counsel patient  extensively concerning his noncompliance with medications prior to discharge.    DVT prophylaxis: Heparin drip Code Status: Full Family Communication: None Disposition Plan: TBD   Consultants:  Vascular surgery ID   Procedures/Significant Events:     I have personally reviewed and interpreted all  radiology studies and my findings are as above.  VENTILATOR SETTINGS:    Cultures   Antimicrobials:    Devices    LINES / TUBES:      Continuous Infusions: . sodium chloride 50 mL/hr at 10/05/18 0827  . sodium chloride    . heparin 2,300 Units/hr (10/05/18 0446)  . [START ON 10/06/2018] vancomycin       Objective: Vitals:   10/04/18 1305 10/04/18 1343 10/04/18 2035 10/05/18 0449  BP:  (!) 133/91 112/79 117/79  Pulse:  78 79 77  Resp:  18 18 20   Temp:  98 F (36.7 C) 97.9 F (36.6 C) 98.2 F (36.8 C)  TempSrc:  Oral Oral Oral  SpO2: 100% 100% 100% 100%  Weight:      Height:        Intake/Output Summary (Last 24 hours) at 10/05/2018 [id number] Last data filed at 10/05/2018 0827 Gross per 24 hour  Intake 100 ml  Output 450 ml  Net -350 ml   Filed Weights   09/30/18 0330 09/30/18 0606 10/01/18 1930  Weight: 75.6 kg 75.6 kg 81.9 kg   Physical Exam:  General: A/O x4, no acute respiratory distress Eyes: negative scleral hemorrhage, negative anisocoria, negative icterus ENT: Negative Runny nose, negative gingival bleeding, Neck:  Negative scars, masses, torticollis, lymphadenopathy, JVD Lungs: Clear to auscultation bilaterally without wheezes or crackles Cardiovascular: Regular rate and rhythm without murmur gallop or rub normal S1 and S2 Abdomen: negative abdominal pain, nondistended, positive soft, bowel sounds, no rebound, no ascites, no appreciable mass Extremities: LEFT BKA, covered and clean did not remove dressing  Skin: Negative rashes, lesions, ulcers Psychiatric:  Negative depression, negative anxiety, negative fatigue, negative mania  Central nervous system:  Cranial nerves II through XII intact, tongue/uvula midline, all extremities muscle strength 5/5, sensation intact throughout,  negative dysarthria, negative expressive aphasia, negative receptive aphasia.   .     Data Reviewed: Care during the described time interval was provided by me .  I  have reviewed this patient's available data, including medical history, events of note, physical examination, and all test results as part of my evaluation.   CBC: Recent Labs  Lab 09/30/18 0054 10/01/18 0236 10/03/18 0913 10/04/18 0253 10/05/18 0323  WBC 25.0* 15.8* 13.3* 12.7* 11.4*  NEUTROABS 22.2* 11.7*  --   --   --   HGB 6.9* 8.2* 8.0* 7.4* 8.2*  HCT 22.0* 26.2* 26.0* 23.0* 24.9*  MCV 68.8* 70.8* 73.0* 70.8* 70.7*  PLT 399 386 392 385 [id number]   Basic Metabolic Panel: Recent Labs  Lab 09/30/18 0054 10/01/18 0236 10/03/18 0913 10/04/18 0253 10/05/18 0323  NA 132* 134* 135 134* 135  K 4.6 5.4* 4.6 4.4 4.5  CL 103 102 109 109 110  CO2 22 21* 18* 17* 17*  GLUCOSE 111* 157* 119* 126* 76  BUN 24* 27* 23* 19 18  CREATININE 1.42* 1.78* 1.73* 1.39* 1.55*  CALCIUM 7.8* 7.6* 7.8* 7.6* 7.7*  MG  --   --   --   --  1.8   GFR: Estimated Creatinine Clearance: 60.9 mL/min (A) (by C-G formula based on SCr of 1.55 mg/dL (H)). Liver Function Tests: Recent Labs  Lab 09/30/18 0054  AST  12*  ALT 8  ALKPHOS 107  BILITOT 1.1  PROT 8.7*  ALBUMIN 1.9*   No results for input(s): LIPASE, AMYLASE in the last 168 hours. No results for input(s): AMMONIA in the last 168 hours. Coagulation Profile: Recent Labs  Lab 09/30/18 0054  INR 1.4*   Cardiac Enzymes: No results for input(s): CKTOTAL, CKMB, CKMBINDEX, TROPONINI in the last 168 hours. BNP (last 3 results) No results for input(s): PROBNP in the last 8760 hours. HbA1C: No results for input(s): HGBA1C in the last 72 hours. CBG: Recent Labs  Lab 10/04/18 1245 10/04/18 1620 10/04/18 2033 10/04/18 2209 10/05/18 0801  GLUCAP 86 126* 94 85 81   Lipid Profile: No results for input(s): CHOL, HDL, LDLCALC, TRIG, CHOLHDL, LDLDIRECT in the last 72 hours. Thyroid Function Tests: No results for input(s): TSH, T4TOTAL, FREET4, T3FREE, THYROIDAB in the last 72 hours. Anemia Panel: No results for input(s): VITAMINB12, FOLATE,  FERRITIN, TIBC, IRON, RETICCTPCT in the last 72 hours. Urine analysis:    Component Value Date/Time   COLORURINE AMBER (A) 09/30/2018 0051   APPEARANCEUR CLOUDY (A) 09/30/2018 0051   LABSPEC 1.024 09/30/2018 0051   PHURINE 5.0 09/30/2018 0051   GLUCOSEU NEGATIVE 09/30/2018 0051   HGBUR NEGATIVE 09/30/2018 0051   BILIRUBINUR NEGATIVE 09/30/2018 0051   KETONESUR NEGATIVE 09/30/2018 0051   PROTEINUR 100 (A) 09/30/2018 0051   NITRITE NEGATIVE 09/30/2018 0051   LEUKOCYTESUR NEGATIVE 09/30/2018 0051   Sepsis Labs: @LABRCNTIP (procalcitonin:4,lacticidven:4)  ) Recent Results (from the past 240 hour(s))  Culture, blood (Routine x 2)     Status: Abnormal   Collection Time: 09/30/18 12:54 AM   Specimen: BLOOD  Result Value Ref Range Status   Specimen Description   Final    BLOOD LEFT ANTECUBITAL Performed at [facility], [place] [address], [place], [place] [address]    Special Requests   Final    BOTTLES DRAWN AEROBIC AND ANAEROBIC Blood Culture results may not be optimal due to an inadequate volume of blood received in culture bottles Performed at [place] [place] [address], [place], [place] [address]    Culture  Setup Time   Final    GRAM POSITIVE COCCI IN CLUSTERS IN BOTH AEROBIC AND ANAEROBIC BOTTLES CRITICAL RESULT CALLED TO, READ BACK BY AND VERIFIED WITH: J. GRIMSLEY, PHARMD (WL) AT 2110 ON 09/30/18 BY C. JESSUP, MT.    Culture (A)  Final    STAPHYLOCOCCUS AUREUS SUSCEPTIBILITIES PERFORMED ON PREVIOUS CULTURE WITHIN THE LAST 5 DAYS. Performed at [facility], [place] [address] [place], [place] [address]    Report Status 10/02/2018 FINAL  Final  Culture, blood (Routine x 2)     Status: Abnormal   Collection Time: 09/30/18  1:30 AM   Specimen: BLOOD RIGHT FOREARM  Result Value Ref Range Status   Specimen Description   Final    BLOOD RIGHT FOREARM Performed at [facility] [address], [place], [place]  [address]    Special Requests   Final    BOTTLES DRAWN AEROBIC AND ANAEROBIC Blood Culture adequate volume Performed at [facility] [address], [place], [place] [address]    Culture  Setup Time   Final    GRAM POSITIVE COCCI IN CLUSTERS IN BOTH AEROBIC AND ANAEROBIC BOTTLES CRITICAL RESULT CALLED TO, READ BACK BY AND VERIFIED WITH: Lavell Luster, PHARMD (WL) AT 2110 ON 09/30/18 BY C. JESSUP, MT. Performed at [facility], [place],  [place] [address]    Culture METHICILLIN RESISTANT STAPHYLOCOCCUS AUREUS (A)  Final   Report Status 10/02/2018 FINAL  Final   Organism ID, Bacteria METHICILLIN RESISTANT STAPHYLOCOCCUS AUREUS  Final      Susceptibility   Methicillin resistant staphylococcus aureus - MIC*    CIPROFLOXACIN >=8 RESISTANT Resistant     ERYTHROMYCIN >=8 RESISTANT Resistant     GENTAMICIN <=0.5 SENSITIVE Sensitive     OXACILLIN >=4 RESISTANT Resistant     TETRACYCLINE <=1 SENSITIVE Sensitive     VANCOMYCIN 1 SENSITIVE Sensitive     TRIMETH/SULFA <=10 SENSITIVE Sensitive     CLINDAMYCIN <=0.25 SENSITIVE Sensitive     RIFAMPIN <=0.5 SENSITIVE Sensitive     Inducible Clindamycin NEGATIVE Sensitive     * METHICILLIN RESISTANT STAPHYLOCOCCUS AUREUS  Blood Culture ID Panel (Reflexed)     Status: Abnormal   Collection Time: 09/30/18  1:30 AM  Result Value Ref Range Status   Enterococcus species NOT DETECTED NOT DETECTED Final   Listeria monocytogenes NOT DETECTED NOT DETECTED Final   Staphylococcus species DETECTED (A) NOT DETECTED Final    Comment: CRITICAL RESULT CALLED TO, READ BACK BY AND VERIFIED WITH: J. GRIMSLEY, PHARMD (WL) AT 2110 ON 09/30/18 BY C. JESSUP, MT.    Staphylococcus aureus (BCID) DETECTED (A) NOT DETECTED Final    Comment: Methicillin (oxacillin)-resistant Staphylococcus aureus (MRSA). MRSA is predictably resistant to beta-lactam antibiotics (except ceftaroline). Preferred therapy is vancomycin unless clinically  contraindicated. Patient requires contact precautions if  hospitalized. CRITICAL RESULT CALLED TO, READ BACK BY AND VERIFIED WITH: J. GRIMSLEY, PHARMD (WL) AT 2110 ON 09/30/18 BY C. JESSUP, MT.    Methicillin resistance DETECTED (A) NOT DETECTED Final    Comment: CRITICAL RESULT CALLED TO, READ BACK BY AND VERIFIED WITH: J. GRIMSLEY, PHARMD (WL) AT 2110 ON 09/30/18 BY C. JESSUP, MT.    Streptococcus species NOT DETECTED NOT DETECTED Final   Streptococcus agalactiae NOT DETECTED NOT DETECTED Final   Streptococcus pneumoniae NOT DETECTED NOT DETECTED Final   Streptococcus pyogenes NOT DETECTED NOT DETECTED Final   Acinetobacter baumannii NOT DETECTED NOT DETECTED Final   Enterobacteriaceae species NOT DETECTED NOT DETECTED Final   Enterobacter cloacae complex NOT DETECTED NOT DETECTED Final   Escherichia coli NOT DETECTED NOT DETECTED Final   Klebsiella oxytoca NOT DETECTED NOT DETECTED Final   Klebsiella pneumoniae NOT DETECTED NOT DETECTED Final   Proteus species NOT DETECTED NOT DETECTED Final   Serratia marcescens NOT DETECTED NOT DETECTED Final   Haemophilus influenzae NOT DETECTED NOT DETECTED Final   Neisseria meningitidis NOT DETECTED NOT DETECTED Final   Pseudomonas aeruginosa NOT DETECTED NOT DETECTED Final   Candida albicans NOT DETECTED NOT DETECTED Final   Candida glabrata NOT DETECTED NOT DETECTED Final   Candida krusei NOT DETECTED NOT DETECTED Final   Candida parapsilosis NOT DETECTED NOT DETECTED Final   Candida tropicalis NOT DETECTED NOT DETECTED Final    Comment: Performed at [facility], [place]. [address], [place], [place] [address]  SARS Coronavirus 2 [facility] order, Performed in [facility] hospital lab) Nasopharyngeal Nasopharyngeal Swab     Status: None   Collection Time: 09/30/18  1:40 AM   Specimen: Nasopharyngeal Swab  Result Value Ref Range Status   SARS Coronavirus 2 NEGATIVE NEGATIVE Final    Comment: (NOTE) If result is NEGATIVE SARS-CoV-2 target  nucleic acids are NOT DETECTED. The SARS-CoV-2 RNA is generally detectable in upper and lower  respiratory specimens during the acute phase of infection. The lowest  concentration of SARS-CoV-2 viral copies this assay can detect is 250  copies / mL. A negative result does not preclude SARS-CoV-2 infection  and should not be used as the sole basis for treatment or other  patient management decisions.  A negative result may occur with  improper specimen collection / handling, submission of specimen other  than nasopharyngeal swab, presence of viral mutation(s) within the  areas targeted by this assay, and inadequate number of viral copies  (<250 copies / mL). A negative result must be combined with clinical  observations, patient history, and epidemiological information. If result is POSITIVE SARS-CoV-2 target nucleic acids are DETECTED. The SARS-CoV-2 RNA is generally detectable in upper and lower  respiratory specimens dur ing the acute phase of infection.  Positive  results are indicative of active infection with SARS-CoV-2.  Clinical  correlation with patient history and other diagnostic information is  necessary to determine patient infection status.  Positive results do  not rule out bacterial infection or co-infection with other viruses. If result is PRESUMPTIVE POSTIVE SARS-CoV-2 nucleic acids MAY BE PRESENT.   A presumptive positive result was obtained on the submitted specimen  and confirmed on repeat testing.  While 2019 novel coronavirus  (SARS-CoV-2) nucleic acids may be present in the submitted sample  additional confirmatory testing may be necessary for epidemiological  and / or clinical management purposes  to differentiate between  SARS-CoV-2 and other Sarbecovirus currently known to infect humans.  If clinically indicated additional testing with an alternate test  methodology [phone]) is advised. The SARS-CoV-2 RNA is generally  detectable in upper and lower  respiratory sp ecimens during the acute  phase of infection. The expected result is Negative. Fact Sheet for Patients:  StrictlyIdeas.no Fact Sheet for Healthcare Providers: BankingDealers.co.za This test is not yet approved or cleared by the [place] FDA and has been authorized for detection and/or diagnosis of SARS-CoV-2 by FDA under an Emergency Use Authorization (EUA).  This EUA will remain in effect (meaning this test can be used) for the duration of the COVID-19 declaration under Section 564(b)(1) of the Act, 21 U.S.C. section 360bbb-3(b)(1), unless the authorization is terminated or revoked sooner. Performed at [facility], [place] [address] [place], [place] [address]   MRSA PCR Screening     Status: None   Collection Time: 09/30/18  4:01 AM   Specimen: Nasal Mucosa; Nasopharyngeal  Result Value Ref Range Status   MRSA by PCR NEGATIVE NEGATIVE Final    Comment:        The GeneXpert MRSA Assay (FDA approved for NASAL specimens only), is one component of a comprehensive MRSA colonization surveillance program. It is not intended to diagnose MRSA infection nor to guide or monitor treatment for MRSA infections. Performed at [facility], [place] [address] [place], [place] [address]   Surgical pcr screen     Status: None   Collection Time: 10/02/18  7:31 AM   Specimen: Nasal Mucosa; Nasal Swab  Result Value Ref Range Status   MRSA, PCR NEGATIVE NEGATIVE Final   Staphylococcus aureus NEGATIVE NEGATIVE Final    Comment: (NOTE) The Xpert SA Assay (FDA approved for NASAL specimens in patients 63 years of age and older), is one component of a comprehensive surveillance program. It is not intended to diagnose infection nor to guide or monitor treatment. Performed at [facility], [place] [address], [place], [place] [address]   Culture, blood (routine x 2)     Status:  None (Preliminary  result)   Collection Time: 10/02/18  7:19 PM   Specimen: BLOOD LEFT HAND  Result Value Ref Range Status   Specimen Description BLOOD LEFT HAND  Final   Special Requests   Final    BOTTLES DRAWN AEROBIC AND ANAEROBIC Blood Culture results may not be optimal due to an inadequate volume of blood received in culture bottles   Culture   Final    NO GROWTH 2 DAYS Performed at [facility], [place] [address], [place], [place] [address]    Report Status PENDING  Incomplete  Culture, blood (routine x 2)     Status: None (Preliminary result)   Collection Time: 10/02/18  9:02 PM   Specimen: BLOOD LEFT HAND  Result Value Ref Range Status   Specimen Description BLOOD LEFT HAND  Final   Special Requests   Final    BOTTLES DRAWN AEROBIC AND ANAEROBIC Blood Culture adequate volume   Culture   Final    NO GROWTH 2 DAYS Performed at [facility], [place] [address], [place], [place] [address]    Report Status PENDING  Incomplete         Radiology Studies: No results found.      Scheduled Meds: . amLODipine  10 mg Oral Daily  . atorvastatin  40 mg Oral QHS  . carvedilol  12.5 mg Oral BID  . Chlorhexidine Gluconate Cloth  6 each Topical Daily  . ferrous sulfate  325 mg Oral Q breakfast  . gabapentin  300 mg Oral BID  . insulin aspart  0-15 Units Subcutaneous TID WC  . insulin NPH Human  10 Units Subcutaneous BID AC & HS  . morphine  15 mg Oral Q12H  . nicotine  21 mg Transdermal Daily  . polyethylene glycol  17 g Oral Daily  . senna-docusate  2 tablet Oral BID   Continuous Infusions: . sodium chloride 50 mL/hr at 10/05/18 0827  . sodium chloride    . heparin 2,300 Units/hr (10/05/18 0446)  . [START ON 10/06/2018] vancomycin       LOS: 5 days   The patient is critically ill with multiple organ systems failure and requires high complexity decision making for assessment and support, frequent evaluation and titration of therapies, application of advanced monitoring  technologies and extensive interpretation of multiple databases. Critical Care Time devoted to patient care services described in this note  Time spent: 40 minutes     WOODS, Geraldo Docker, MD [facility] Pager [phone]  If 7PM-7AM, please contact night-coverage [email or web address] Password [facility] 10/05/2018, 8:32 AM

## 2018-10-05 NOTE — Evaluation (Signed)
Physical Therapy Evaluation Patient Details Name: Daniel Moon MRN: [id number] DOB: 05-Feb-1960 Today's Date: 10/05/2018   History of Present Illness  Pt is a 58 y.o. male admitted 09/30/18 with nonhealing L transmet amputation wound; worked up for sepsis likely due to osteomyelitis. Echo 9/14 with LV thrombus; heparin level therapeutic range as of 9/18. 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 PMH includes CHF, HTN, Hep-C, DM, PVD.    Clinical Impression  Pt presents with an overall decrease in functional mobility secondary to above. PTA, pt reports mod indep with walking stick, lives with family. Initiated amputee education re: precautions, positioning, therex, and importance of mobility. Today's evaluation limited by pt's c/o 10/10 LLE pain and fatigue; pt requiring assist to reposition in bed. Able to perform L residual limb hip flexion/abd, adamant against attempting knee flex/ext or touching limb. Pt plans to return home with his mom and nephew. Demonstrates poor insight into deficits and skills needed to return home safely. Will follow acutely to address established goals.     Follow Up Recommendations Home health PT;Supervision/Assistance - 24 hour    Equipment Recommendations  Rolling walker with 5" wheels;3in1 (PT);Wheelchair (measurements PT);Wheelchair cushion (measurements PT)    Recommendations for Other Services       Precautions / Restrictions Precautions Precautions: Fall Restrictions Weight Bearing Restrictions: Yes LLE Weight Bearing: Non weight bearing      Mobility  Bed Mobility               General bed mobility comments: HHA to come to long sitting for repositioning; pt declined sitting EOB due to pain  Transfers                 General transfer comment: Pt declined due to pain; reports he has been transferring to [facility] with nursing staff (squat pivot)  Ambulation/Gait                Stairs            Wheelchair Mobility    Modified Rankin (Stroke  Patients Only)       Balance                                             Pertinent Vitals/Pain Pain Assessment: 0-10 Pain Score: 10-Worst pain ever Faces Pain Scale: Hurts even more Pain Location: L residual limb Pain Descriptors / Indicators: Operative site guarding;Grimacing Pain Intervention(s): Monitored during session;Repositioned;Premedicated before session    Home Living Family/patient expects to be discharged to:: Private residence Living Arrangements: Parent;Other relatives(mother, nephew) Available Help at Discharge: Family Type of Home: House Home Access: Stairs to enter Entrance Stairs-Rails: Right Entrance Stairs-Number of Steps: 3 Home Layout: One level Home Equipment: Other (comment)(walking stick)      Prior Function Level of Independence: Independent with assistive device(s)         Comments: Use of walking stick     Hand Dominance   Dominant Hand: Right    Extremity/Trunk Assessment   Upper Extremity Assessment Upper Extremity Assessment: Overall WFL for tasks assessed    Lower Extremity Assessment Lower Extremity Assessment: LLE deficits/detail LLE Deficits / Details: s/p L BKA; hip flex/abd at least 3/5, pt not willing to attempt knee flex/ext; resting L knee in extension LLE: Unable to fully assess due to pain    Cervical / Trunk Assessment Cervical / Trunk Assessment: Normal  Communication  Communication: No difficulties  Cognition Arousal/Alertness: Awake/alert Behavior During Therapy: WFL for tasks assessed/performed Overall Cognitive Status: No family/caregiver present to determine baseline cognitive functioning Area of Impairment: Safety/judgement;Problem solving                         Safety/Judgement: Decreased awareness of safety;Decreased awareness of deficits   Problem Solving: Slow processing General Comments: Appropriate conversation, although poor insight into deficits       General  Comments General comments (skin integrity, edema, etc.): Initiated amputee education including precautions/resting with knee extension, phantom limb pain, desensitization techniques (pt unwilling to touch LLE this session(, future prosthetic use (pt initially asking if we had prosthetic for him to use today)    Exercises Amputee Exercises Hip ABduction/ADduction: AROM;Left;Supine Hip Flexion/Marching: AROM;Left;Supine   Assessment/Plan    PT Assessment Patient needs continued PT services  PT Problem List Decreased strength;Decreased range of motion;Decreased activity tolerance;Decreased balance;Decreased mobility;Decreased knowledge of use of DME;Decreased safety awareness;Decreased knowledge of precautions;Pain       PT Treatment Interventions DME instruction;Gait training;Stair training;Functional mobility training;Therapeutic activities;Therapeutic exercise;Balance training;Patient/family education;Wheelchair mobility training    PT Goals (Current goals can be found in the Care Plan section)  Acute Rehab PT Goals Patient Stated Goal: go home PT Goal Formulation: With patient Time For Goal Achievement: 10/19/18 Potential to Achieve Goals: Good    Frequency Min 3X/week   Barriers to discharge Decreased caregiver support;Inaccessible home environment      Co-evaluation PT/OT/SLP Co-Evaluation/Treatment: Yes Reason for Co-Treatment: Necessary to address cognition/behavior during functional activity;For patient/therapist safety PT goals addressed during session: Strengthening/ROM OT goals addressed during session: ADL's and self-care       AM-PAC PT "6 Clicks" Mobility  Outcome Measure Help needed turning from your back to your side while in a flat bed without using bedrails?: A Little Help needed moving from lying on your back to sitting on the side of a flat bed without using bedrails?: A Little Help needed moving to and from a bed to a chair (including a wheelchair)?: A  Little Help needed standing up from a chair using your arms (e.g., wheelchair or bedside chair)?: A Lot Help needed to walk in hospital room?: A Lot Help needed climbing 3-5 steps with a railing? : Total 6 Click Score: 14    End of Session   Activity Tolerance: Patient limited by pain Patient left: in bed;with call bell/phone within reach;with bed alarm set Nurse Communication: Mobility status PT Visit Diagnosis: Other abnormalities of gait and mobility (R26.89);Pain Pain - Right/Left: Left Pain - part of body: Leg    Time: 1356-1420 PT Time Calculation (min) (ACUTE ONLY): 24 min   Charges:   PT Evaluation $PT Eval Moderate Complexity: 1 Mod         Mabeline Caras, PT, DPT Acute Rehabilitation Services  Pager [phone] Office [place] 10/05/2018, 3:22 PM

## 2018-10-05 NOTE — Evaluation (Signed)
Occupational Therapy Evaluation Patient Details Name: Daniel Moon MRN: [id number] DOB: October 03, 1960 Today's Date: 10/05/2018    History of Present Illness Pt is a 58 y.o. male admitted 09/30/18 with nonhealing L transmet amputation wound; worked up for sepsis likely due to osteomyelitis. Echo 9/14 with LV thrombus. 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 PMH includes CHF, HTN, Hep-C, DM, PVD.   Clinical Impression   Pt was independent with a walking stick prior to admission. Pt with increased L LE pain and fatigue following earlier TEE. Limited evaluation. Pt needs set up to total assist for ADL. He has been transferring with one person assist to [facility]. Pt with goal of going home with his mom and nephew. Decreased insight into what skills he will need in order to go home safely. Will follow acutely.    Follow Up Recommendations  Home health OT;Supervision/Assistance - 24 hour    Equipment Recommendations  3 in 1 bedside commode;Tub/shower bench;Wheelchair (measurements OT);Wheelchair cushion (measurements OT)    Recommendations for Other Services       Precautions / Restrictions Precautions Precautions: Fall Restrictions Weight Bearing Restrictions: Yes LLE Weight Bearing: Non weight bearing      Mobility Bed Mobility               General bed mobility comments: pt declined sitting at EOB  Transfers                 General transfer comment: deferred    Balance                                           ADL either performed or assessed with clinical judgement   ADL Overall ADL's : Needs assistance/impaired Eating/Feeding: Independent;Sitting   Grooming: Set up;Bed level   Upper Body Bathing: Bed level;Moderate assistance   Lower Body Bathing: Total assistance;Bed level   Upper Body Dressing : Minimal assistance;Bed level   Lower Body Dressing: Total assistance;Bed level                 General ADL Comments: pt has been transferring to [facility] with  nursing and one person assist, but pt declining OOB due to pain     Vision Baseline Vision/History: Wears glasses Wears Glasses: Reading only Patient Visual Report: No change from baseline       Perception     Praxis      Pertinent Vitals/Pain Pain Assessment: Faces Faces Pain Scale: Hurts even more Pain Location: L residual limb Pain Descriptors / Indicators: Operative site guarding;Grimacing Pain Intervention(s): Monitored during session;Repositioned     Hand Dominance Right   Extremity/Trunk Assessment Upper Extremity Assessment Upper Extremity Assessment: Overall WFL for tasks assessed   Lower Extremity Assessment Lower Extremity Assessment: LLE deficits/detail LLE Deficits / Details: s/p L BKA; pt not willing to move LLE or let it be moved   Cervical / Trunk Assessment Cervical / Trunk Assessment: Normal   Communication Communication Communication: No difficulties   Cognition Arousal/Alertness: Awake/alert Behavior During Therapy: WFL for tasks assessed/performed Overall Cognitive Status: No family/caregiver present to determine baseline cognitive functioning Area of Impairment: Safety/judgement;Problem solving                         Safety/Judgement: Decreased awareness of safety;Decreased awareness of deficits   Problem Solving: Slow processing General Comments: Appropriate conversation, although poor insight into deficits  General Comments       Exercises     Shoulder Instructions      Home Living Family/patient expects to be discharged to:: Private residence Living Arrangements: Parent;Other relatives(mother, nephew) Available Help at Discharge: Family Type of Home: House Home Access: Stairs to enter Technical brewer of Steps: 3 Entrance Stairs-Rails: Right Home Layout: One level     Bathroom Shower/Tub: Teacher, early years/pre: Standard     Home Equipment: Other (comment)(walking stick)          Prior  Functioning/Environment Level of Independence: Independent with assistive device(s)        Comments: Use of walking stick        OT Problem List: Decreased strength;Decreased activity tolerance;Impaired balance (sitting and/or standing);Decreased knowledge of use of DME or AE;Decreased cognition;Pain      OT Treatment/Interventions: Self-care/ADL training;DME and/or AE instruction;Patient/family education;Balance training;Therapeutic activities;Cognitive remediation/compensation    OT Goals(Current goals can be found in the care plan section) Acute Rehab OT Goals Patient Stated Goal: go home OT Goal Formulation: With patient Time For Goal Achievement: 10/19/18 Potential to Achieve Goals: Good ADL Goals Pt Will Perform Grooming: (P) with supervision;with set-up;sitting(at sink) Pt Will Perform Lower Body Bathing: (P) with supervision;with set-up;sitting/lateral leans Pt Will Perform Lower Body Dressing: (P) with supervision;with set-up;sitting/lateral leans Pt Will Transfer to Toilet: (P) with supervision;ambulating;bedside commode(over toilet) Pt Will Perform Toileting - Clothing Manipulation and hygiene: (P) with supervision;sit to/from stand Additional ADL Goal #1: (P) Pt will perform bed mobility independently.  OT Frequency: Min 2X/week   Barriers to D/C:            Co-evaluation PT/OT/SLP Co-Evaluation/Treatment: Yes Reason for Co-Treatment: Necessary to address cognition/behavior during functional activity   OT goals addressed during session: ADL's and self-care      AM-PAC OT "6 Clicks" Daily Activity     Outcome Measure Help from another person eating meals?: None Help from another person taking care of personal grooming?: None Help from another person toileting, which includes using toliet, bedpan, or urinal?: A Lot Help from another person bathing (including washing, rinsing, drying)?: A Lot Help from another person to put on and taking off regular upper body  clothing?: A Little Help from another person to put on and taking off regular lower body clothing?: Total 6 Click Score: 16   End of Session    Activity Tolerance: Patient limited by pain Patient left: in bed;with call bell/phone within reach  OT Visit Diagnosis: Pain                Time: [id number] OT Time Calculation (min): 21 min Charges:  OT General Charges $OT Visit: 1 Visit OT Evaluation $OT Eval Moderate Complexity: 1 Mod  [name], OTR/L Acute Rehabilitation Services Pager: [phone] Office: [phone] [name] 10/05/2018, 2:43 PM

## 2018-10-05 NOTE — Anesthesia Procedure Notes (Signed)
Procedure Name: MAC Date/Time: 10/05/2018 11:42 AM Performed by: Candis Shine, CRNA Pre-anesthesia Checklist: Patient identified, Emergency Drugs available, Suction available and Patient being monitored Patient Re-evaluated:Patient Re-evaluated prior to induction Oxygen Delivery Method: Nasal cannula Dental Injury: Teeth and Oropharynx as per pre-operative assessment

## 2018-10-05 NOTE — Progress Notes (Signed)
   VASCULAR SURGERY ASSESSMENT & PLAN:   POD 3 Left BKA: His left below the knee amputation site looks fine without erythema or drainage.  I have written for daily dressing changes.  ANTICOAGULATION:He is back on heparin for his ventricular thrombus.  I will check back on Monday.  If any problems over the weekend Dr. Juanda [place] is on call.   SUBJECTIVE:   No complaints this morning.  PHYSICAL EXAM:   Vitals:   10/04/18 1343 10/04/18 2035 10/05/18 0449 10/05/18 1118  BP: (!) 133/91 112/79 117/79 (!) 141/93  Pulse: 78 79 77   Resp: 18 18 20 20   Temp: 98 F (36.7 C) 97.9 F (36.6 C) 98.2 F (36.8 C) (!) 97.4 F (36.3 C)  TempSrc: Oral Oral Oral Temporal  SpO2: 100% 100% 100% 100%  Weight:    81.9 kg  Height:    6\' 3"  (1.905 m)   His left below the knee amputation dressing is clean and dry.  LABS:   Lab Results  Component Value Date   WBC 11.4 (H) 10/05/2018   HGB 8.2 (L) 10/05/2018   HCT 24.9 (L) 10/05/2018   MCV 70.7 (L) 10/05/2018   PLT 272 10/05/2018   Lab Results  Component Value Date   CREATININE 1.55 (H) 10/05/2018   Lab Results  Component Value Date   INR 1.4 (H) 09/30/2018   CBG (last 3)  Recent Labs    10/04/18 2033 10/04/18 2209 10/05/18 0801  GLUCAP 94 85 81    PROBLEM LIST:    Principal Problem:   MRSA bacteremia Active Problems:   HTN (hypertension)   Diabetes ([place])   HLD (hyperlipidemia)   COPD (chronic obstructive pulmonary disease) (HCC)   Sepsis (HCC)   Open wound of left foot   Osteomyelitis (HCC)   Microcytic anemia   PAD (peripheral artery disease) (HCC)   Peripheral neuropathy   Cigarette smoker   S/P transmetatarsal amputation of foot, left (HCC)   Pressure injury of skin   Chronic renal insufficiency, stage 3 (moderate) (HCC)   Paroxysmal atrial fibrillation (HCC)   Unintentional weight loss   Poor dentition   CURRENT MEDS:   . [MAR Hold] amLODipine  10 mg Oral Daily  . [MAR Hold] atorvastatin  40 mg  Oral QHS  . [MAR Hold] carvedilol  12.5 mg Oral BID  . [MAR Hold] Chlorhexidine Gluconate Cloth  6 each Topical Daily  . [MAR Hold] ferrous sulfate  325 mg Oral Q breakfast  . [MAR Hold] gabapentin  300 mg Oral BID  . [MAR Hold] insulin aspart  0-15 Units Subcutaneous TID WC  . [MAR Hold] insulin NPH Human  10 Units Subcutaneous BID AC & HS  . [MAR Hold] morphine  15 mg Oral Q12H  . [MAR Hold] nicotine  21 mg Transdermal Daily  . [MAR Hold] polyethylene glycol  17 g Oral Daily  . [MAR Hold] senna-docusate  2 tablet Oral BID    Deitra Mayo Beeper: [id number] Office: [phone] 10/05/2018

## 2018-10-05 NOTE — Progress Notes (Signed)
Vancomycin trough level drawn at 2310, IV vancomycin given at 2319. Pharmacist called RN to stop infusion.

## 2018-10-05 NOTE — Progress Notes (Signed)
ANTICOAGULATION CONSULT NOTE - Follow Up Consult  Pharmacy Consult for Heparin Indication: LV thrombus  Allergies  Allergen Reactions  . Amoxicillin-Pot Clavulanate Nausea And Vomiting    Did it involve swelling of the face/tongue/throat, SOB, or low BP? No Did it involve sudden or severe rash/hives, skin peeling, or any reaction on the inside of your mouth or nose? No Did you need to seek medical attention at a hospital or doctor's office? No When did it last happen?6 months ago If all above answers are "NO", may proceed with cephalosporin use.    Patient Measurements: Height: 6\' 3"  (190.5 cm) Weight: 180 lb 8.9 oz (81.9 kg) IBW/kg (Calculated) : 84.5 Heparin Dosing Weight: 81.9 kg  Vital Signs: Temp: 98.2 F (36.8 C) (09/18 1300) Temp Source: Oral (09/18 1300) BP: 147/92 (09/18 1300) Pulse Rate: 82 (09/18 1239)  Labs: Recent Labs    10/03/18 0913  10/04/18 0253  10/04/18 1957 10/05/18 0323 10/05/18 1301  HGB 8.0*  --  7.4*  --   --  8.2*  --   HCT 26.0*  --  23.0*  --   --  24.9*  --   PLT 392  --  385  --   --  272  --   HEPARINUNFRC <0.10*   < > 0.12*   < > 0.42 0.27* 0.43  CREATININE 1.73*  --  1.39*  --   --  1.55*  --    < > = values in this interval not displayed.    Estimated Creatinine Clearance: 60.9 mL/min (A) (by C-G formula based on SCr of 1.55 mg/dL (H)).  Assessment: 58 yr old male started on IV heparin for LV thrombus.  .  Heparin level was therapeutic (0.43 units/ml) at 2300 units/hr.     Goal of Therapy:  Heparin level 0.3-0.7 units/ml Monitor platelets by anticoagulation protocol: Yes   Plan:  Continue heparin infusion at 2300 units/hr Check confirmatory heparin level in 6 hours Monitor daily heparin level, CBC Monitor for signs/sx of bleeding F/u for conversion to oral therapy  Tyjon Bowen A. Levada Dy, PharmD, BCPS, FNKF Clinical Pharmacist [place] Please utilize Amion for appropriate phone number to reach the unit pharmacist ([place])   10/05/2018,1:49 PM

## 2018-10-05 NOTE — Anesthesia Postprocedure Evaluation (Signed)
Anesthesia Post Note  Patient: Daniel Moon  Procedure(s) Performed: TRANSESOPHAGEAL ECHOCARDIOGRAM (TEE) (N/A )     Patient location during evaluation: PACU Anesthesia Type: General Level of consciousness: sedated Pain management: pain level controlled Vital Signs Assessment: post-procedure vital signs reviewed and stable Respiratory status: spontaneous breathing and respiratory function stable Cardiovascular status: stable Postop Assessment: no apparent nausea or vomiting Anesthetic complications: no    Last Vitals:  Vitals:   10/05/18 1229 10/05/18 1239  BP: 121/86 138/89  Pulse: 72 82  Resp: 18 20  Temp: 36.6 C   SpO2: 100% 98%    Last Pain:  Vitals:   10/05/18 1239  TempSrc:   PainSc: 0-No pain                 Draydon Clairmont DANIEL

## 2018-10-05 NOTE — Interval H&P Note (Signed)
History and Physical Interval Note:  10/05/2018 11:27 AM  Daniel Moon  has presented today for surgery, with the diagnosis of BACTEREMIA.  The various methods of treatment have been discussed with the patient and family. After consideration of risks, benefits and other options for treatment, the patient has consented to  Procedure(s): TRANSESOPHAGEAL ECHOCARDIOGRAM (TEE) (N/A) as a surgical intervention.  The patient's history has been reviewed, patient examined, no change in status, stable for surgery.  I have reviewed the patient's chart and labs.  Questions were answered to the patient's satisfaction.     [employer]

## 2018-10-05 NOTE — Transfer of Care (Signed)
Immediate Anesthesia Transfer of Care Note  Patient: Daniel Moon  Procedure(s) Performed: TRANSESOPHAGEAL ECHOCARDIOGRAM (TEE) (N/A )  Patient Location: Endoscopy Unit  Anesthesia Type:MAC  Level of Consciousness: drowsy and patient cooperative  Airway & Oxygen Therapy: Patient Spontanous Breathing  Post-op Assessment: Report given to RN, Post -op Vital signs reviewed and stable and Patient moving all extremities X 4  Post vital signs: Reviewed and stable  Last Vitals:  Vitals Value Taken Time  BP    Temp    Pulse    Resp    SpO2      Last Pain:  Vitals:   10/05/18 1118  TempSrc: Temporal  PainSc: 9       Patients Stated Pain Goal: 2 (86/76/72 0947)  Complications: No apparent anesthesia complications

## 2018-10-05 NOTE — Progress Notes (Signed)
    [facility] has been requested to perform a transesophageal echocardiogram on Mr. Daniel Moon for Bacteremia.  After careful review of history and examination, the risks and benefits of transesophageal echocardiogram have been explained including risks of esophageal damage, perforation (1:10,000 risk), bleeding, pharyngeal hematoma as well as other potential complications associated with conscious sedation including aspiration, arrhythmia, respiratory failure and death. Alternatives to treatment were discussed, questions were answered. Patient is willing to proceed.  TEE - Dr. Marlou Porch @ 1245. Keep NPO. Meds with sips.   Leanor Kail, PA-C 10/05/2018 9:25 AM

## 2018-10-05 NOTE — Plan of Care (Signed)
  Problem: Health Behavior/Discharge Planning: Goal: Ability to manage health-related needs will improve Outcome: Progressing   

## 2018-10-05 NOTE — Progress Notes (Signed)
OT Cancellation Note  Patient Details Name: Daniel Moon MRN: [id number] DOB: 01/22/1960   Cancelled Treatment:    Reason Eval/Treat Not Completed: Patient at procedure or test/ unavailable  [name] 10/05/2018, 11:20 AM  [name], OTR/L Acute Rehabilitation Services Pager: [phone] Office: [phone]

## 2018-10-05 NOTE — Progress Notes (Signed)
[facility] for Infectious Disease  Date of Admission:  09/30/2018     Total days of antibiotics 7         ASSESSMENT:  Mr. 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 TEE completed with no evidence of vegetation or endocarditis with severe tricuspid valve regurgitation and pulmonary hypertension. Repeat blood cultures showing clearance of bacteremia. Given source control obtained through surgery will plan for 2 weeks of oral therapy with Zyvox 600 mg bid.   PLAN:  1. Continue vancomycin while in hospital. 2. Start Zyvox 600 mg bid twice daily. Transition pharmacy has provided medication and at bedside.  3. Follow up in ID office in 3 weeks for repeat cultures.    ID will sign off and be available for the remainder of hospitalization as needed.   Principal Problem:   MRSA bacteremia Active Problems:   HTN (hypertension)   Diabetes ([place])   HLD (hyperlipidemia)   COPD (chronic obstructive pulmonary disease) (HCC)   Sepsis (HCC)   Open wound of left foot   Osteomyelitis (HCC)   Microcytic anemia   PAD (peripheral artery disease) (HCC)   Peripheral neuropathy   Cigarette smoker   S/P transmetatarsal amputation of foot, left (HCC)   Pressure injury of skin   Chronic renal insufficiency, stage 3 (moderate) (HCC)   Paroxysmal atrial fibrillation (HCC)   Unintentional weight loss   Poor dentition   . amLODipine  10 mg Oral Daily  . atorvastatin  40 mg Oral QHS  . carvedilol  12.5 mg Oral BID  . Chlorhexidine Gluconate Cloth  6 each Topical Daily  . ferrous sulfate  325 mg Oral Q breakfast  . gabapentin  300 mg Oral BID  . insulin aspart  0-15 Units Subcutaneous TID WC  . insulin NPH Human  10 Units Subcutaneous BID AC & HS  . morphine  15 mg Oral Q12H  . nicotine  21 mg Transdermal Daily  . polyethylene glycol  17 g Oral Daily  . senna-docusate  2 tablet Oral BID    SUBJECTIVE:  Afebrile overnight with no acute  events. TEE negative for endocarditis. Feeling okay today. Eating lunch during visit.   Allergies  Allergen Reactions  . Amoxicillin-Pot Clavulanate Nausea And Vomiting    Did it involve swelling of the face/tongue/throat, SOB, or low BP? No Did it involve sudden or severe rash/hives, skin peeling, or any reaction on the inside of your mouth or nose? No Did you need to seek medical attention at a hospital or doctor's office? No When did it last happen?6 months ago If all above answers are "NO", may proceed with cephalosporin use.     Review of Systems: Review of Systems  Constitutional: Negative for chills, fever and weight loss.  Respiratory: Negative for cough, shortness of breath and wheezing.   Cardiovascular: Negative for chest pain and leg swelling.  Gastrointestinal: Negative for abdominal pain, constipation, diarrhea, nausea and vomiting.  Skin: Negative for rash.      OBJECTIVE: Vitals:   10/05/18 1210 10/05/18 1219 10/05/18 1229 10/05/18 1239  BP: 111/63 111/80 121/86 138/89  Pulse: 79 79 72 82  Resp: 20 18 18 20   Temp:   97.9 F (36.6 C)   TempSrc:   Temporal   SpO2: 100% 95% 100% 98%  Weight:      Height:       Body mass index is 22.57 kg/m.  Physical Exam Constitutional:  General: He is not in acute distress.    Appearance: He is well-developed.     Comments: Lying in bed with head of bed elevated eating lunch; pleasant.   Cardiovascular:     Rate and Rhythm: Normal rate and regular rhythm.     Heart sounds: Murmur present.  Pulmonary:     Effort: Pulmonary effort is normal.     Breath sounds: Normal breath sounds.  Musculoskeletal:     Comments: Left BKA dressing is clean and dry. Site appears without evidence of infection.  Skin:    General: Skin is warm and dry.  Neurological:     Mental Status: He is alert and oriented to person, place, and time.  Psychiatric:        Mood and Affect: Mood normal.     Lab Results Lab Results   Component Value Date   WBC 11.4 (H) 10/05/2018   HGB 8.2 (L) 10/05/2018   HCT 24.9 (L) 10/05/2018   MCV 70.7 (L) 10/05/2018   PLT 272 10/05/2018    Lab Results  Component Value Date   CREATININE 1.55 (H) 10/05/2018   BUN 18 10/05/2018   NA 135 10/05/2018   K 4.5 10/05/2018   CL 110 10/05/2018   CO2 17 (L) 10/05/2018    Lab Results  Component Value Date   ALT 8 09/30/2018   AST 12 (L) 09/30/2018   ALKPHOS 107 09/30/2018   BILITOT 1.1 09/30/2018     Microbiology: Recent Results (from the past 240 hour(s))  Culture, blood (Routine x 2)     Status: Abnormal   Collection Time: 09/30/18 12:54 AM   Specimen: BLOOD  Result Value Ref Range Status   Specimen Description   Final    BLOOD LEFT ANTECUBITAL Performed at [facility], [place] [address] [place], [place] [address]    Special Requests   Final    BOTTLES DRAWN AEROBIC AND ANAEROBIC Blood Culture results may not be optimal due to an inadequate volume of blood received in culture bottles Performed at [facility] [address] [place], [place] [address]    Culture  Setup Time   Final    GRAM POSITIVE COCCI IN CLUSTERS IN BOTH AEROBIC AND ANAEROBIC BOTTLES CRITICAL RESULT CALLED TO, READ BACK BY AND VERIFIED WITH: J. GRIMSLEY, PHARMD (WL) AT 2110 ON 09/30/18 BY C. JESSUP, MT.    Culture (A)  Final    STAPHYLOCOCCUS AUREUS SUSCEPTIBILITIES PERFORMED ON PREVIOUS CULTURE WITHIN THE LAST 5 DAYS. Performed at [facility], [place] [address], [place], [place] [address]    Report Status 10/02/2018 FINAL  Final  Culture, blood (Routine x 2)     Status: Abnormal   Collection Time: 09/30/18  1:30 AM   Specimen: BLOOD RIGHT FOREARM  Result Value Ref Range Status   Specimen Description   Final    BLOOD RIGHT FOREARM Performed at [facility] [address] [place], [place] [address]    Special Requests   Final    BOTTLES DRAWN AEROBIC AND ANAEROBIC Blood Culture  adequate volume Performed at [facility] [address], [place], [place] [address]    Culture  Setup Time   Final    GRAM POSITIVE COCCI IN CLUSTERS IN BOTH AEROBIC AND ANAEROBIC BOTTLES CRITICAL RESULT CALLED TO, READ BACK BY AND VERIFIED WITH: Lavell Luster, PHARMD (WL) AT 2110 ON 09/30/18 BY C. JESSUP, MT. Performed at [facility], [place]  [address], [place], [place] [address]    Culture METHICILLIN RESISTANT STAPHYLOCOCCUS AUREUS (A)  Final   Report Status 10/02/2018 FINAL  Final   Organism ID, Bacteria METHICILLIN RESISTANT STAPHYLOCOCCUS AUREUS  Final      Susceptibility   Methicillin resistant staphylococcus aureus - MIC*    CIPROFLOXACIN >=8 RESISTANT Resistant     ERYTHROMYCIN >=8 RESISTANT Resistant     GENTAMICIN <=0.5 SENSITIVE Sensitive     OXACILLIN >=4 RESISTANT Resistant     TETRACYCLINE <=1 SENSITIVE Sensitive     VANCOMYCIN 1 SENSITIVE Sensitive     TRIMETH/SULFA <=10 SENSITIVE Sensitive     CLINDAMYCIN <=0.25 SENSITIVE Sensitive     RIFAMPIN <=0.5 SENSITIVE Sensitive     Inducible Clindamycin NEGATIVE Sensitive     * METHICILLIN RESISTANT STAPHYLOCOCCUS AUREUS  Blood Culture ID Panel (Reflexed)     Status: Abnormal   Collection Time: 09/30/18  1:30 AM  Result Value Ref Range Status   Enterococcus species NOT DETECTED NOT DETECTED Final   Listeria monocytogenes NOT DETECTED NOT DETECTED Final   Staphylococcus species DETECTED (A) NOT DETECTED Final    Comment: CRITICAL RESULT CALLED TO, READ BACK BY AND VERIFIED WITH: J. GRIMSLEY, PHARMD (WL) AT 2110 ON 09/30/18 BY C. JESSUP, MT.    Staphylococcus aureus (BCID) DETECTED (A) NOT DETECTED Final    Comment: Methicillin (oxacillin)-resistant Staphylococcus aureus (MRSA). MRSA is predictably resistant to beta-lactam antibiotics (except ceftaroline). Preferred therapy is vancomycin unless clinically contraindicated. Patient requires contact precautions if  hospitalized. CRITICAL RESULT CALLED TO,  READ BACK BY AND VERIFIED WITH: J. GRIMSLEY, PHARMD (WL) AT 2110 ON 09/30/18 BY C. JESSUP, MT.    Methicillin resistance DETECTED (A) NOT DETECTED Final    Comment: CRITICAL RESULT CALLED TO, READ BACK BY AND VERIFIED WITH: J. GRIMSLEY, PHARMD (WL) AT 2110 ON 09/30/18 BY C. JESSUP, MT.    Streptococcus species NOT DETECTED NOT DETECTED Final   Streptococcus agalactiae NOT DETECTED NOT DETECTED Final   Streptococcus pneumoniae NOT DETECTED NOT DETECTED Final   Streptococcus pyogenes NOT DETECTED NOT DETECTED Final   Acinetobacter baumannii NOT DETECTED NOT DETECTED Final   Enterobacteriaceae species NOT DETECTED NOT DETECTED Final   Enterobacter cloacae complex NOT DETECTED NOT DETECTED Final   Escherichia coli NOT DETECTED NOT DETECTED Final   Klebsiella oxytoca NOT DETECTED NOT DETECTED Final   Klebsiella pneumoniae NOT DETECTED NOT DETECTED Final   Proteus species NOT DETECTED NOT DETECTED Final   Serratia marcescens NOT DETECTED NOT DETECTED Final   Haemophilus influenzae NOT DETECTED NOT DETECTED Final   Neisseria meningitidis NOT DETECTED NOT DETECTED Final   Pseudomonas aeruginosa NOT DETECTED NOT DETECTED Final   Candida albicans NOT DETECTED NOT DETECTED Final   Candida glabrata NOT DETECTED NOT DETECTED Final   Candida krusei NOT DETECTED NOT DETECTED Final   Candida parapsilosis NOT DETECTED NOT DETECTED Final   Candida tropicalis NOT DETECTED NOT DETECTED Final    Comment: Performed at [facility], [name]. [address] [place], [place] [address]  SARS Coronavirus 2 [facility] order, Performed in [facility] hospital lab) Nasopharyngeal Nasopharyngeal Swab     Status: None   Collection Time: 09/30/18  1:40 AM   Specimen: Nasopharyngeal Swab  Result Value Ref Range Status   SARS Coronavirus 2 NEGATIVE NEGATIVE Final    Comment: (NOTE) If result is NEGATIVE SARS-CoV-2 target nucleic acids are NOT DETECTED. The SARS-CoV-2 RNA is generally detectable in upper and lower   respiratory specimens during the acute phase of infection.  The lowest  concentration of SARS-CoV-2 viral copies this assay can detect is 250  copies / mL. A negative result does not preclude SARS-CoV-2 infection  and should not be used as the sole basis for treatment or other  patient management decisions.  A negative result may occur with  improper specimen collection / handling, submission of specimen other  than nasopharyngeal swab, presence of viral mutation(s) within the  areas targeted by this assay, and inadequate number of viral copies  (<250 copies / mL). A negative result must be combined with clinical  observations, patient history, and epidemiological information. If result is POSITIVE SARS-CoV-2 target nucleic acids are DETECTED. The SARS-CoV-2 RNA is generally detectable in upper and lower  respiratory specimens dur ing the acute phase of infection.  Positive  results are indicative of active infection with SARS-CoV-2.  Clinical  correlation with patient history and other diagnostic information is  necessary to determine patient infection status.  Positive results do  not rule out bacterial infection or co-infection with other viruses. If result is PRESUMPTIVE POSTIVE SARS-CoV-2 nucleic acids MAY BE PRESENT.   A presumptive positive result was obtained on the submitted specimen  and confirmed on repeat testing.  While 2019 novel coronavirus  (SARS-CoV-2) nucleic acids may be present in the submitted sample  additional confirmatory testing may be necessary for epidemiological  and / or clinical management purposes  to differentiate between  SARS-CoV-2 and other Sarbecovirus currently known to infect humans.  If clinically indicated additional testing with an alternate test  methodology [phone]) is advised. The SARS-CoV-2 RNA is generally  detectable in upper and lower respiratory sp ecimens during the acute  phase of infection. The expected result is Negative. Fact  Sheet for Patients:  StrictlyIdeas.no Fact Sheet for Healthcare Providers: BankingDealers.co.za This test is not yet approved or cleared by the [place] FDA and has been authorized for detection and/or diagnosis of SARS-CoV-2 by FDA under an Emergency Use Authorization (EUA).  This EUA will remain in effect (meaning this test can be used) for the duration of the COVID-19 declaration under Section 564(b)(1) of the Act, 21 U.S.C. section 360bbb-3(b)(1), unless the authorization is terminated or revoked sooner. Performed at [facility], [place] [address], [place], [place] [address]   MRSA PCR Screening     Status: None   Collection Time: 09/30/18  4:01 AM   Specimen: Nasal Mucosa; Nasopharyngeal  Result Value Ref Range Status   MRSA by PCR NEGATIVE NEGATIVE Final    Comment:        The GeneXpert MRSA Assay (FDA approved for NASAL specimens only), is one component of a comprehensive MRSA colonization surveillance program. It is not intended to diagnose MRSA infection nor to guide or monitor treatment for MRSA infections. Performed at [facility], [place] [address] [place], [place] [address]   Surgical pcr screen     Status: None   Collection Time: 10/02/18  7:31 AM   Specimen: Nasal Mucosa; Nasal Swab  Result Value Ref Range Status   MRSA, PCR NEGATIVE NEGATIVE Final   Staphylococcus aureus NEGATIVE NEGATIVE Final    Comment: (NOTE) The Xpert SA Assay (FDA approved for NASAL specimens in patients 40 years of age and older), is one component of a comprehensive surveillance program. It is not intended to diagnose infection nor to guide or monitor treatment. Performed at [facility], [place] [address] [place], [place] [address]   Culture, blood (routine x 2)  Status: None (Preliminary result)   Collection Time: 10/02/18  7:19 PM   Specimen: BLOOD LEFT HAND  Result Value Ref Range  Status   Specimen Description BLOOD LEFT HAND  Final   Special Requests   Final    BOTTLES DRAWN AEROBIC AND ANAEROBIC Blood Culture results may not be optimal due to an inadequate volume of blood received in culture bottles   Culture   Final    NO GROWTH 2 DAYS Performed at [facility], [place] [address], [place], [place] [address]    Report Status PENDING  Incomplete  Culture, blood (routine x 2)     Status: None (Preliminary result)   Collection Time: 10/02/18  9:02 PM   Specimen: BLOOD LEFT HAND  Result Value Ref Range Status   Specimen Description BLOOD LEFT HAND  Final   Special Requests   Final    BOTTLES DRAWN AEROBIC AND ANAEROBIC Blood Culture adequate volume   Culture   Final    NO GROWTH 2 DAYS Performed at [facility], [place] [address] [place], [place] [address]    Report Status PENDING  Incomplete     Terri Piedra, [place] [facility] [phone] Pager  10/05/2018  1:10 PM

## 2018-10-05 NOTE — Progress Notes (Signed)
ANTICOAGULATION CONSULT NOTE - Follow Up Consult  Pharmacy Consult for heparin Indication: LV thrombus  Labs: Recent Labs    10/03/18 0913  10/04/18 0253 10/04/18 1008 10/04/18 1957 10/05/18 0323  HGB 8.0*  --  7.4*  --   --  8.2*  HCT 26.0*  --  23.0*  --   --  24.9*  PLT 392  --  385  --   --  272  HEPARINUNFRC <0.10*   < > 0.12* 0.26* 0.42 0.27*  CREATININE 1.73*  --  1.39*  --   --  1.55*   < > = values in this interval not displayed.    Assessment: 58yo male subtherapeutic on heparin after one level at goal; no gtt issues or signs of bleeding per RN.  Goal of Therapy:  Heparin level 0.3-0.7 units/ml   Plan:  Will increase heparin gtt by 10% to 2300 units/hr and check level in 6 hours.    Wynona Neat, PharmD, BCPS  10/05/2018,4:39 AM

## 2018-10-05 NOTE — Anesthesia Preprocedure Evaluation (Signed)
Anesthesia Evaluation  Patient identified by MRN, date of birth, ID band Patient awake    Reviewed: Allergy & Precautions, NPO status , Patient's Chart, lab work & pertinent test results  Airway Mallampati: II  TM Distance: >3 FB Neck ROM: Full    Dental  (+) Dental Advisory Given   Pulmonary COPD, Current Smoker,    breath sounds clear to auscultation       Cardiovascular hypertension, Pt. on medications + Past MI, + Peripheral Vascular Disease and +CHF   Rhythm:Irregular Rate:Normal  EF 20-25%. Mod/sev MR and Mod TR   Neuro/Psych negative neurological ROS     GI/Hepatic negative GI ROS, (+) Hepatitis -, C  Endo/Other  diabetes, Type 2  Renal/GU CRFRenal disease     Musculoskeletal   Abdominal   Peds  Hematology  (+) anemia ,   Anesthesia Other Findings   Reproductive/Obstetrics                             Lab Results  Component Value Date   WBC 11.4 (H) 10/05/2018   HGB 8.2 (L) 10/05/2018   HCT 24.9 (L) 10/05/2018   MCV 70.7 (L) 10/05/2018   PLT 272 10/05/2018   Lab Results  Component Value Date   CREATININE 1.55 (H) 10/05/2018   BUN 18 10/05/2018   NA 135 10/05/2018   K 4.5 10/05/2018   CL 110 10/05/2018   CO2 17 (L) 10/05/2018    Anesthesia Physical  Anesthesia Plan  ASA: III  Anesthesia Plan: General   Post-op Pain Management:    Induction: Intravenous  PONV Risk Score and Plan: 0 and Propofol infusion and Treatment may vary due to age or medical condition  Airway Management Planned: Mask  Additional Equipment:   Intra-op Plan:   Post-operative Plan:   Informed Consent: I have reviewed the patients History and Physical, chart, labs and discussed the procedure including the risks, benefits and alternatives for the proposed anesthesia with the patient or authorized representative who has indicated his/her understanding and acceptance.     Dental advisory  given  Plan Discussed with: CRNA  Anesthesia Plan Comments:         Anesthesia Quick Evaluation

## 2018-10-05 NOTE — CV Procedure (Signed)
   Transesophageal Echocardiogram  Indications: Bacteremia  Time out performed Propofol per anesthesia  Findings:  Left Ventricle: EF 30-35% with large spherical fixed apical thrombus (25 x 28mm)  Mitral Valve: Mild MR  Aortic Valve: No AI  Tricuspid Valve: Severe TR, pulm HTN severe  Left Atrium: No thrombus  No vegetations, no endocarditis  Candee Furbish, MD

## 2018-10-05 NOTE — Progress Notes (Signed)
ANTICOAGULATION CONSULT NOTE - Follow Up Consult  Pharmacy Consult for Heparin Indication: LV thrombus  Allergies  Allergen Reactions  . Amoxicillin-Pot Clavulanate Nausea And Vomiting    Did it involve swelling of the face/tongue/throat, SOB, or low BP? No Did it involve sudden or severe rash/hives, skin peeling, or any reaction on the inside of your mouth or nose? No Did you need to seek medical attention at a hospital or doctor's office? No When did it last happen?6 months ago If all above answers are "NO", may proceed with cephalosporin use.    Patient Measurements: Height: 6\' 3"  (190.5 cm) Weight: 180 lb 8.9 oz (81.9 kg) IBW/kg (Calculated) : 84.5 Heparin Dosing Weight: 81.9 kg  Vital Signs: Temp: 98.2 F (36.8 C) (09/18 1300) Temp Source: Oral (09/18 1300) BP: 147/92 (09/18 1300) Pulse Rate: 82 (09/18 1239)  Labs: Recent Labs    10/03/18 0913  10/04/18 0253  10/05/18 0323 10/05/18 1301 10/05/18 1912  HGB 8.0*  --  7.4*  --  8.2*  --   --   HCT 26.0*  --  23.0*  --  24.9*  --   --   PLT 392  --  385  --  272  --   --   HEPARINUNFRC <0.10*   < > 0.12*   < > 0.27* 0.43 0.52  CREATININE 1.73*  --  1.39*  --  1.55*  --   --    < > = values in this interval not displayed.    Estimated Creatinine Clearance: 60.9 mL/min (A) (by C-G formula based on SCr of 1.55 mg/dL (H)).  Assessment: 58 yr old male started on IV heparin for LV thrombus.  .  Heparin level was therapeutic (0.43 units/ml) at 2300 units/hr earlier today (13:01 PM).  Confirmatory heparin level drawn ~6 hrs after the 13:01 PM level was 0.52 units/ml, which remains within the desired goal range for this patient.   Per RN, no issues with IV or signs/sx of bleeding observed.  Goal of Therapy:  Heparin level 0.3-0.7 units/ml Monitor platelets by anticoagulation protocol: Yes   Plan:  Continue heparin infusion at 2300 units/hr Monitor daily heparin level, CBC Monitor for signs/sx of bleeding FU for  conversion to oral anticoagulant therapy  Gillermina Hu, PharmD, BCPS, [facility] Clinical Pharmacist 10/05/2018,7:55 PM

## 2018-10-06 ENCOUNTER — Encounter (HOSPITAL_COMMUNITY): Payer: Self-pay | Admitting: Cardiology

## 2018-10-06 DIAGNOSIS — G6282 Radiation-induced polyneuropathy: Secondary | ICD-10-CM

## 2018-10-06 DIAGNOSIS — I5022 Chronic systolic (congestive) heart failure: Secondary | ICD-10-CM

## 2018-10-06 DIAGNOSIS — K089 Disorder of teeth and supporting structures, unspecified: Secondary | ICD-10-CM

## 2018-10-06 LAB — BASIC METABOLIC PANEL
Anion gap: 10 (ref 5–15)
BUN: 15 mg/dL (ref 6–20)
CO2: 19 mmol/L — ABNORMAL LOW (ref 22–32)
Calcium: 8 mg/dL — ABNORMAL LOW (ref 8.9–10.3)
Chloride: 106 mmol/L (ref 98–111)
Creatinine, Ser: 1.4 mg/dL — ABNORMAL HIGH (ref 0.61–1.24)
GFR calc Af Amer: >60 mL/min (ref >60)
GFR calc non Af Amer: 55 mL/min — ABNORMAL LOW (ref >60)
Glucose, Bld: 134 mg/dL — ABNORMAL HIGH (ref 70–99)
Potassium: 4.2 mmol/L (ref 3.5–5.1)
Sodium: 135 mmol/L (ref 135–145)

## 2018-10-06 LAB — CBC
HCT: 26.8 % — ABNORMAL LOW (ref 39.0–52.0)
Hemoglobin: 8.2 g/dL — ABNORMAL LOW (ref 13.0–17.0)
MCH: 22.7 pg — ABNORMAL LOW (ref 26.0–34.0)
MCHC: 30.6 g/dL (ref 30.0–36.0)
MCV: 74 fL — ABNORMAL LOW (ref 80.0–100.0)
Platelets: 417 10*3/uL — ABNORMAL HIGH (ref 150–400)
RBC: 3.62 MIL/uL — ABNORMAL LOW (ref 4.22–5.81)
RDW: 24.4 % — ABNORMAL HIGH (ref 11.5–15.5)
WBC: 11.3 10*3/uL — ABNORMAL HIGH (ref 4.0–10.5)
nRBC: 0 % (ref 0.0–0.2)

## 2018-10-06 LAB — GLUCOSE, CAPILLARY
Glucose-Capillary: 138 mg/dL — ABNORMAL HIGH (ref 70–99)
Glucose-Capillary: 116 mg/dL — ABNORMAL HIGH (ref 70–99)
Glucose-Capillary: 169 mg/dL — ABNORMAL HIGH (ref 70–99)

## 2018-10-06 LAB — PHOSPHORUS: Phosphorus: 3.2 mg/dL (ref 2.5–4.6)

## 2018-10-06 LAB — LIPID PANEL
Cholesterol: 97 mg/dL (ref 0–200)
HDL: 18 mg/dL — ABNORMAL LOW (ref >40)
LDL Cholesterol: 65 mg/dL (ref 0–99)
Total CHOL/HDL Ratio: 5.4 RATIO
Triglycerides: 71 mg/dL (ref <150)
VLDL: 14 mg/dL (ref 0–40)

## 2018-10-06 LAB — MAGNESIUM: Magnesium: 1.8 mg/dL (ref 1.7–2.4)

## 2018-10-06 LAB — HEPARIN LEVEL (UNFRACTIONATED): Heparin Unfractionated: 0.39 IU/mL (ref 0.30–0.70)

## 2018-10-06 MED ORDER — GABAPENTIN 300 MG PO CAPS
300.0000 mg | ORAL_CAPSULE | Freq: Three times a day (TID) | ORAL | Status: DC
  Administered 2018-10-06 – 2018-10-10 (×12): 300 mg via ORAL
  Filled 2018-10-06 (×12): qty 1

## 2018-10-06 MED ORDER — MORPHINE SULFATE (PF) 2 MG/ML IV SOLN
2.0000 mg | Freq: Once | INTRAVENOUS | Status: AC
  Administered 2018-10-06: 2 mg via INTRAVENOUS
  Filled 2018-10-06: qty 1

## 2018-10-06 MED ORDER — DULOXETINE HCL 30 MG PO CPEP
30.0000 mg | ORAL_CAPSULE | Freq: Every day | ORAL | Status: DC
  Administered 2018-10-06 – 2018-10-09 (×4): 30 mg via ORAL
  Filled 2018-10-06 (×5): qty 1

## 2018-10-06 NOTE — Progress Notes (Signed)
Patient helped to bathroom and started complaining of severe stump pain 10/10. Percocet 2 tabs  and Morphine 4 mg IV given simultaneously. He continues to to c/o pain, MD on call paged with no additional pain medicine to be given at this time.

## 2018-10-06 NOTE — Progress Notes (Signed)
PT Cancellation Note  Patient Details Name: Daniel Moon MRN: [id number] DOB: 1960-10-04   Cancelled Treatment:    Reason Eval/Treat Not Completed: (P) Pain limiting ability to participate(Pt reports he pain is not managed and this is hindering his ability to get up and participate.  Pt educated extensively on benefits of mobility and risks of immobility.  Family at bedside and no help to persuade pt to participate.)   Novelle Addair Eli Hose 10/06/2018, 3:59 PM  Governor Rooks, PTA Acute Rehabilitation Services Pager [phone] Office [phone]

## 2018-10-06 NOTE — Progress Notes (Signed)
PROGRESS NOTE    Daniel Moon  [id number] DOB: [birth year] DOA: [id number] PCP: Sanjuana Letters, MD   Brief Narrative:  Daniel Moon is a 58 y.o. BM PMHx DM type II uncontrolled with complication, DM peripheral neuropathy, nonhealing left transmetatarsal amputation wound and limb threatening ischemia   Admitted with sepsis likely due to osteomyelitis. The patient is a poor historian but tells me he started having fevers today at home. He says he was feeling fine until today when he says his family heard him wake up yelling in his sleep. He was found to have a fever and his family called EMS. He last had balloon angioplasty in July and says he was given antibiotics after that procedure but didn't take them since they made him feel sick. Since that time, he has not seen a physician.   ED Course: He was found to be febrile, have a leukocytosis and XR of foot shows evidence of osteomyelitis. He was given IVF but gently due to his history of CHF, and his blood pressure is stable. He was given empiric vancomycin and zosyn.   Subjective: 9/19 A/O x4, patient extremely agitated stating at 0 500 pain was uncontrolled after he used the bathroom on bedside commode.  Patient describes the pain as electric with uncontrolled fluttering of his stump.  Staff complains patient became belligerent cursing at them.   Assessment & Plan:   Principal Problem:   MRSA bacteremia Active Problems:   HTN (hypertension)   Diabetes ([place])   HLD (hyperlipidemia)   COPD (chronic obstructive pulmonary disease) ([place])   Sepsis ([place])   Open wound of left foot   Osteomyelitis (HCC)   Microcytic anemia   PAD (peripheral artery disease) (HCC)   Peripheral neuropathy   Cigarette smoker   S/P transmetatarsal amputation of foot, left (HCC)   Pressure injury of skin   Chronic renal insufficiency, stage 3 (moderate) (HCC)   Paroxysmal atrial fibrillation (HCC)   Unintentional weight loss   Poor dentition   Sepsis secondary to LEFT lower extremity osteomyelitis/MRSA bacteremia -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78* 1.73* 1.39* 1.55*  - At baseline  Hyperkalemia -Potassium previously 5.4 no EKG changes -Resolved  Metabolic acidosis -[id number] sodium bicarb 50 ml/hr  Iron deficiency anemia/anemia of chronic disease -Presented hemoglobin 6.9  - Appears multiple transfusions -Iron studies done on 09/30/2018 significant for iron deficiency -Hold off IV iron supplement in the setting of active infective process -Start p.o. ferrous sulfate 325 daily  Peripheral vascular disease ABI ordered and pending Hold off Plavix given need for full dose anticoagulation, could consider 81 aspirin at discharge  Chronic systolic CHF -EF 20 to [id number] -Strict in and out +6.8 L -Daily weight Filed Weights   09/30/18 0606 10/01/18 1930 10/05/18 1118  Weight: 75.6 kg 81.9 kg 81.9 kg  -Se[facility] at [facility] -TTE remarkable for 2.6 x 2 cm LV thrombus - On heparin drip.  Next week will need to discuss with patient  and NCM what medication patient can afford for long-term anticoagulation  Diabetes type 2 uncontrolled with complication -[id number] hemoglobin A1c= 8.4 - Moderate SSI -Lipid panel pending  Noncompliance medication - We will need to counsel patient extensively concerning his noncompliance with medications prior to discharge.   Belligerence toward staff - Counseled patient at length with his wife present that is belligerent and cursing at staff members would not be tolerated.  If it continues to occur patient will be discharged.   DVT prophylaxis: Heparin drip Code Status: Full Family Communication: 9/19 wife present at bedside Disposition Plan: TBD   Consultants:  Vascular surgery ID Cardiology   Procedures/Significant Events:  9/15 s/p LEFT BKA   I have personally reviewed and interpreted all radiology studies and my findings are as above.  VENTILATOR SETTINGS:    Cultures 9/13 blood LEFT antecubital positive MRSA 9/13 blood RIGHT forearm positive MRSA 9/13 MRSA by PCR negative 9/15 blood LEFT hand NGTD x2     Antimicrobials: Anti-infectives (From admission, onward)   Start     Stop   10/06/18 1000  vancomycin (VANCOCIN) 1,250 mg in sodium chloride 0.9 % 250 mL IVPB         10/05/18 0000  linezolid (ZYVOX) 600 MG tablet     10/19/18 2359   10/02/18 1530  piperacillin-tazobactam (ZOSYN) IVPB 3.375 g  Status:  Discontinued     10/02/18 1520   10/02/18 1530  piperacillin-tazobactam (ZOSYN) IVPB 3.375 g     10/02/18 1535   10/01/18 2200  vancomycin (VANCOCIN) 1,250 mg in sodium chloride 0.9 % 250 mL IVPB  Status:  Discontinued     10/05/18 0000   09/30/18 2200  vancomycin (VANCOCIN) 1,750 mg in sodium chloride 0.9 % 500 mL IVPB  Status:  Discontinued     10/01/18 1035   09/30/18 0600  piperacillin-tazobactam (ZOSYN) IVPB 3.375 g  Status:  Discontinued     10/02/18 1012   09/30/18 0145  piperacillin-tazobactam (ZOSYN) IVPB 3.375 g     09/30/18 0203   09/30/18 0145  vancomycin (VANCOCIN) 1,750 mg in sodium chloride 0.9 % 500 mL IVPB     09/30/18 0206       Devices    LINES / TUBES:      Continuous Infusions: . sodium chloride    . heparin 2,300 Units/hr (10/05/18 2214)  . sodium bicarbonate 150 mEq in dextrose 5% 1000 mL 150 mEq (10/05/18 1311)  . vancomycin        Objective: Vitals:   10/05/18 1239 10/05/18 1300 10/05/18 2228 10/06/18 0447  BP: 138/89 (!) 147/92 127/79 139/86  Pulse: 82  88 87  Resp: 20 18 18 19   Temp:  98.2 F (36.8 C) 98.7 F (37.1 C) 97.6 F (36.4 C)  TempSrc:  Oral Oral Oral  SpO2: 98% 96% 95% 100%  Weight:      Height:        Intake/Output Summary (Last 24 hours) at 10/06/2018 0900 Last data filed at 10/06/2018 0500 Gross per 24 hour  Intake 1050 ml  Output 550 ml  Net 500 ml   Filed Weights   09/30/18 0606 10/01/18 1930 10/05/18 1118  Weight: 75.6 kg 81.9 kg 81.9 kg   Physical Exam:  General: A/O x4, no acute respiratory distress Eyes: negative scleral hemorrhage, negative anisocoria, negative icterus ENT: Negative Runny nose, negative gingival bleeding, Neck:  Negative scars, masses, torticollis, lymphadenopathy, JVD Lungs: Clear to auscultation bilaterally without wheezes or crackles Cardiovascular: Regular rate and rhythm without  murmur gallop or rub normal S1 and S2 Abdomen: negative abdominal pain, nondistended, positive soft, bowel sounds, no rebound, no ascites, no appreciable mass Extremities: LEFT BKA covered and clean negative sign of infection.  Did not remove dressing Skin: Negative rashes, lesions, ulcers Psychiatric:  Negative depression, negative anxiety, negative fatigue, negative mania  Central nervous system:  Cranial nerves II through XII intact, tongue/uvula midline, all extremities muscle strength 5/5, sensation intact throughout, negative dysarthria, negative expressive aphasia, negative receptive aphasia.   .     Data Reviewed: Care during the described time interval was provided by me .  I have reviewed this patient's available data, including medical history, events of note, physical examination, and all test results as part of my evaluation.   CBC: Recent Labs  Lab 09/30/18 0054 10/01/18 0236 10/03/18 0913 10/04/18 0253 10/05/18 0323  WBC 25.0* 15.8* 13.3* 12.7* 11.4*   NEUTROABS 22.2* 11.7*  --   --   --   HGB 6.9* 8.2* 8.0* 7.4* 8.2*  HCT 22.0* 26.2* 26.0* 23.0* 24.9*  MCV 68.8* 70.8* 73.0* 70.8* 70.7*  PLT 399 386 392 385 [id number]   Basic Metabolic Panel: Recent Labs  Lab 09/30/18 0054 10/01/18 0236 10/03/18 0913 10/04/18 0253 10/05/18 0323  NA 132* 134* 135 134* 135  K 4.6 5.4* 4.6 4.4 4.5  CL 103 102 109 109 110  CO2 22 21* 18* 17* 17*  GLUCOSE 111* 157* 119* 126* 76  BUN 24* 27* 23* 19 18  CREATININE 1.42* 1.78* 1.73* 1.39* 1.55*  CALCIUM 7.8* 7.6* 7.8* 7.6* 7.7*  MG  --   --   --   --  1.8   GFR: Estimated Creatinine Clearance: 60.9 mL/min (A) (by C-G formula based on SCr of 1.55 mg/dL (H)). Liver Function Tests: Recent Labs  Lab 09/30/18 0054  AST 12*  ALT 8  ALKPHOS 107  BILITOT 1.1  PROT 8.7*  ALBUMIN 1.9*   No results for input(s): LIPASE, AMYLASE in the last 168 hours. No results for input(s): AMMONIA in the last 168 hours. Coagulation Profile: Recent Labs  Lab 09/30/18 0054  INR 1.4*   Cardiac Enzymes: No results for input(s): CKTOTAL, CKMB, CKMBINDEX, TROPONINI in the last 168 hours. BNP (last 3 results) No results for input(s): PROBNP in the last 8760 hours. HbA1C: No results for input(s): HGBA1C in the last 72 hours. CBG: Recent Labs  Lab 10/04/18 2033 10/04/18 2209 10/05/18 0801 10/05/18 1732 10/05/18 2216  GLUCAP 94 85 81 144* 192*   Lipid Profile: No results for input(s): CHOL, HDL, LDLCALC, TRIG, CHOLHDL, LDLDIRECT in the last 72 hours. Thyroid Function Tests: No results for input(s): TSH, T4TOTAL, FREET4, T3FREE, THYROIDAB in the last 72 hours. Anemia Panel: No results for input(s): VITAMINB12, FOLATE, FERRITIN, TIBC, IRON, RETICCTPCT in the last 72 hours. Urine analysis:    Component Value Date/Time   COLORURINE AMBER (A) 09/30/2018 0051   APPEARANCEUR CLOUDY (A) 09/30/2018 0051   LABSPEC 1.024 09/30/2018 0051   PHURINE 5.0 09/30/2018 0051   GLUCOSEU NEGATIVE 09/30/2018 0051   HGBUR  NEGATIVE 09/30/2018 0051   BILIRUBINUR NEGATIVE 09/30/2018 0051   KETONESUR NEGATIVE 09/30/2018 0051   PROTEINUR 100 (A) 09/30/2018 0051   NITRITE NEGATIVE 09/30/2018 0051   LEUKOCYTESUR NEGATIVE 09/30/2018 0051   Sepsis Labs: @LABRCNTIP (procalcitonin:4,lacticidven:4)  ) Recent Results (from the past 240 hour(s))  Culture, blood (Routine x 2)     Status: Abnormal   Collection Time: 09/30/18 12:54 AM   Specimen: BLOOD  Result Value Ref  Range Status   Specimen Description   Final    BLOOD LEFT ANTECUBITAL Performed at [facility] [address], [place], [place] [address]    Special Requests   Final    BOTTLES DRAWN AEROBIC AND ANAEROBIC Blood Culture results may not be optimal due to an inadequate volume of blood received in culture bottles Performed at [facility] [address], [place], [place] [address]    Culture  Setup Time   Final    GRAM POSITIVE COCCI IN CLUSTERS IN BOTH AEROBIC AND ANAEROBIC BOTTLES CRITICAL RESULT CALLED TO, READ BACK BY AND VERIFIED WITH: J. GRIMSLEY, PHARMD (WL) AT 2110 ON 09/30/18 BY C. JESSUP, MT.    Culture (A)  Final    STAPHYLOCOCCUS AUREUS SUSCEPTIBILITIES PERFORMED ON PREVIOUS CULTURE WITHIN THE LAST 5 DAYS. Performed at [facility], [place] [address], [place], [place] [address]    Report Status 10/02/2018 FINAL  Final  Culture, blood (Routine x 2)     Status: Abnormal   Collection Time: 09/30/18  1:30 AM   Specimen: BLOOD RIGHT FOREARM  Result Value Ref Range Status   Specimen Description   Final    BLOOD RIGHT FOREARM Performed at [facility] [address] [place], [place] [address]    Special Requests   Final    BOTTLES DRAWN AEROBIC AND ANAEROBIC Blood Culture adequate volume Performed at [name] [address] [place], [place] [address]    Culture  Setup Time   Final    GRAM POSITIVE COCCI IN CLUSTERS IN BOTH AEROBIC AND ANAEROBIC  BOTTLES CRITICAL RESULT CALLED TO, READ BACK BY AND VERIFIED WITH: Lavell Luster, PHARMD (WL) AT 2110 ON 09/30/18 BY C. JESSUP, MT. Performed at [facility], [place] [address] [place], [place] [address]    Culture METHICILLIN RESISTANT STAPHYLOCOCCUS AUREUS (A)  Final   Report Status 10/02/2018 FINAL  Final   Organism ID, Bacteria METHICILLIN RESISTANT STAPHYLOCOCCUS AUREUS  Final      Susceptibility   Methicillin resistant staphylococcus aureus - MIC*    CIPROFLOXACIN >=8 RESISTANT Resistant     ERYTHROMYCIN >=8 RESISTANT Resistant     GENTAMICIN <=0.5 SENSITIVE Sensitive     OXACILLIN >=4 RESISTANT Resistant     TETRACYCLINE <=1 SENSITIVE Sensitive     VANCOMYCIN 1 SENSITIVE Sensitive     TRIMETH/SULFA <=10 SENSITIVE Sensitive     CLINDAMYCIN <=0.25 SENSITIVE Sensitive     RIFAMPIN <=0.5 SENSITIVE Sensitive     Inducible Clindamycin NEGATIVE Sensitive     * METHICILLIN RESISTANT STAPHYLOCOCCUS AUREUS  Blood Culture ID Panel (Reflexed)     Status: Abnormal   Collection Time: 09/30/18  1:30 AM  Result Value Ref Range Status   Enterococcus species NOT DETECTED NOT DETECTED Final   Listeria monocytogenes NOT DETECTED NOT DETECTED Final   Staphylococcus species DETECTED (A) NOT DETECTED Final    Comment: CRITICAL RESULT CALLED TO, READ BACK BY AND VERIFIED WITH: J. GRIMSLEY, PHARMD (WL) AT 2110 ON 09/30/18 BY C. JESSUP, MT.    Staphylococcus aureus (BCID) DETECTED (A) NOT DETECTED Final    Comment: Methicillin (oxacillin)-resistant Staphylococcus aureus (MRSA). MRSA is predictably resistant to beta-lactam antibiotics (except ceftaroline). Preferred therapy is vancomycin unless clinically contraindicated. Patient requires contact precautions if  hospitalized. CRITICAL RESULT CALLED TO, READ BACK BY AND VERIFIED WITH: J. GRIMSLEY, PHARMD (WL) AT 2110 ON 09/30/18 BY C. JESSUP, MT.    Methicillin resistance DETECTED (A) NOT  DETECTED Final    Comment: CRITICAL RESULT CALLED TO, READ BACK  BY AND VERIFIED WITH: J. GRIMSLEY, PHARMD (WL) AT 2110 ON 09/30/18 BY C. JESSUP, MT.    Streptococcus species NOT DETECTED NOT DETECTED Final   Streptococcus agalactiae NOT DETECTED NOT DETECTED Final   Streptococcus pneumoniae NOT DETECTED NOT DETECTED Final   Streptococcus pyogenes NOT DETECTED NOT DETECTED Final   Acinetobacter baumannii NOT DETECTED NOT DETECTED Final   Enterobacteriaceae species NOT DETECTED NOT DETECTED Final   Enterobacter cloacae complex NOT DETECTED NOT DETECTED Final   Escherichia coli NOT DETECTED NOT DETECTED Final   Klebsiella oxytoca NOT DETECTED NOT DETECTED Final   Klebsiella pneumoniae NOT DETECTED NOT DETECTED Final   Proteus species NOT DETECTED NOT DETECTED Final   Serratia marcescens NOT DETECTED NOT DETECTED Final   Haemophilus influenzae NOT DETECTED NOT DETECTED Final   Neisseria meningitidis NOT DETECTED NOT DETECTED Final   Pseudomonas aeruginosa NOT DETECTED NOT DETECTED Final   Candida albicans NOT DETECTED NOT DETECTED Final   Candida glabrata NOT DETECTED NOT DETECTED Final   Candida krusei NOT DETECTED NOT DETECTED Final   Candida parapsilosis NOT DETECTED NOT DETECTED Final   Candida tropicalis NOT DETECTED NOT DETECTED Final    Comment: Performed at [facility], [place] [address] [place], [place] [address]  SARS Coronavirus 2 [facility] order, Performed in [facility] hospital lab) Nasopharyngeal Nasopharyngeal Swab     Status: None   Collection Time: 09/30/18  1:40 AM   Specimen: Nasopharyngeal Swab  Result Value Ref Range Status   SARS Coronavirus 2 NEGATIVE NEGATIVE Final    Comment: (NOTE) If result is NEGATIVE SARS-CoV-2 target nucleic acids are NOT DETECTED. The SARS-CoV-2 RNA is generally detectable in upper and lower  respiratory specimens during the acute phase of infection. The lowest  concentration of SARS-CoV-2 viral copies this assay can detect is 250  copies / mL. A negative result does not preclude SARS-CoV-2  infection  and should not be used as the sole basis for treatment or other  patient management decisions.  A negative result may occur with  improper specimen collection / handling, submission of specimen other  than nasopharyngeal swab, presence of viral mutation(s) within the  areas targeted by this assay, and inadequate number of viral copies  (<250 copies / mL). A negative result must be combined with clinical  observations, patient history, and epidemiological information. If result is POSITIVE SARS-CoV-2 target nucleic acids are DETECTED. The SARS-CoV-2 RNA is generally detectable in upper and lower  respiratory specimens dur ing the acute phase of infection.  Positive  results are indicative of active infection with SARS-CoV-2.  Clinical  correlation with patient history and other diagnostic information is  necessary to determine patient infection status.  Positive results do  not rule out bacterial infection or co-infection with other viruses. If result is PRESUMPTIVE POSTIVE SARS-CoV-2 nucleic acids MAY BE PRESENT.   A presumptive positive result was obtained on the submitted specimen  and confirmed on repeat testing.  While 2019 novel coronavirus  (SARS-CoV-2) nucleic acids may be present in the submitted sample  additional confirmatory testing may be necessary for epidemiological  and / or clinical management purposes  to differentiate between  SARS-CoV-2 and other Sarbecovirus currently known to infect humans.  If clinically indicated additional testing with an alternate test  methodology [phone]) is advised. The SARS-CoV-2 RNA is generally  detectable in upper and lower respiratory sp ecimens during the acute  phase of  infection. The expected result is Negative. Fact Sheet for Patients:  StrictlyIdeas.no Fact Sheet for Healthcare Providers: BankingDealers.co.za This test is not yet approved or cleared by the [place]  FDA and has been authorized for detection and/or diagnosis of SARS-CoV-2 by FDA under an Emergency Use Authorization (EUA).  This EUA will remain in effect (meaning this test can be used) for the duration of the COVID-19 declaration under Section 564(b)(1) of the Act, 21 U.S.C. section 360bbb-3(b)(1), unless the authorization is terminated or revoked sooner. Performed at [facility], [place] [address], [place], [place] [address]   MRSA PCR Screening     Status: None   Collection Time: 09/30/18  4:01 AM   Specimen: Nasal Mucosa; Nasopharyngeal  Result Value Ref Range Status   MRSA by PCR NEGATIVE NEGATIVE Final    Comment:        The GeneXpert MRSA Assay (FDA approved for NASAL specimens only), is one component of a comprehensive MRSA colonization surveillance program. It is not intended to diagnose MRSA infection nor to guide or monitor treatment for MRSA infections. Performed at [facility], [place] [address], [place], [place] [address]   Surgical pcr screen     Status: None   Collection Time: 10/02/18  7:31 AM   Specimen: Nasal Mucosa; Nasal Swab  Result Value Ref Range Status   MRSA, PCR NEGATIVE NEGATIVE Final   Staphylococcus aureus NEGATIVE NEGATIVE Final    Comment: (NOTE) The Xpert SA Assay (FDA approved for NASAL specimens in patients 43 years of age and older), is one component of a comprehensive surveillance program. It is not intended to diagnose infection nor to guide or monitor treatment. Performed at [facility], [place] [address], [place], [place] [address]   Culture, blood (routine x 2)     Status: None (Preliminary result)   Collection Time: 10/02/18  7:19 PM   Specimen: BLOOD LEFT HAND  Result Value Ref Range Status   Specimen Description BLOOD LEFT HAND  Final   Special Requests   Final    BOTTLES DRAWN AEROBIC AND ANAEROBIC Blood Culture results may not be optimal due to an inadequate volume of blood received in  culture bottles   Culture   Final    NO GROWTH 3 DAYS Performed at [facility], [place] [address] [place], [place] [address]    Report Status PENDING  Incomplete  Culture, blood (routine x 2)     Status: None (Preliminary result)   Collection Time: 10/02/18  9:02 PM   Specimen: BLOOD LEFT HAND  Result Value Ref Range Status   Specimen Description BLOOD LEFT HAND  Final   Special Requests   Final    BOTTLES DRAWN AEROBIC AND ANAEROBIC Blood Culture adequate volume   Culture   Final    NO GROWTH 3 DAYS Performed at [facility], [place] [address], [place],  [address]    Report Status PENDING  Incomplete         Radiology Studies: No results found.      Scheduled Meds: . amLODipine  10 mg Oral Daily  . atorvastatin  40 mg Oral QHS  . carvedilol  12.5 mg Oral BID  . Chlorhexidine Gluconate Cloth  6 each Topical Daily  . ferrous sulfate  325 mg Oral Q breakfast  . gabapentin  300 mg Oral BID  . insulin aspart  0-15 Units Subcutaneous TID WC  . insulin NPH Human  10 Units  Subcutaneous BID AC & HS  . morphine  15 mg Oral Q12H  . nicotine  21 mg Transdermal Daily  . polyethylene glycol  17 g Oral Daily  . senna-docusate  2 tablet Oral BID   Continuous Infusions: . sodium chloride    . heparin 2,300 Units/hr (10/05/18 2214)  . sodium bicarbonate 150 mEq in dextrose 5% 1000 mL 150 mEq (10/05/18 1311)  . vancomycin       LOS: 6 days   The patient is critically ill with multiple organ systems failure and requires high complexity decision making for assessment and support, frequent evaluation and titration of therapies, application of advanced monitoring technologies and extensive interpretation of multiple databases. Critical Care Time devoted to patient care services described in this note  Time spent: 40 minutes     Lonie Newsham, Geraldo Docker, MD [facility] Pager [phone]  If 7PM-7AM, please contact night-coverage [email or web address] Password [email or web address]  10/06/2018, 9:00 AM

## 2018-10-06 NOTE — Progress Notes (Signed)
Very upset this morning . Using profanities to explain his dissatisfaction with his care last night and this hospital. Extra dose of Morphine given due to complain of unreleived pain. Was on the phone with family most of the time since this morning. I listened to his concern and demands. Will continue to monitor his pain.

## 2018-10-06 NOTE — Progress Notes (Signed)
Patient continues to c/o severe pain, MD on call paged again with new order.

## 2018-10-06 NOTE — Progress Notes (Signed)
ANTICOAGULATION CONSULT NOTE - Follow Up Consult  Pharmacy Consult for Heparin Indication: LV thrombus  Allergies  Allergen Reactions  . Amoxicillin-Pot Clavulanate Nausea And Vomiting    Did it involve swelling of the face/tongue/throat, SOB, or low BP? No Did it involve sudden or severe rash/hives, skin peeling, or any reaction on the inside of your mouth or nose? No Did you need to seek medical attention at a hospital or doctor's office? No When did it last happen?6 months ago If all above answers are "NO", may proceed with cephalosporin use.    Patient Measurements: Height: 6\' 3"  (190.5 cm) Weight: 180 lb 8.9 oz (81.9 kg) IBW/kg (Calculated) : 84.5 Heparin Dosing Weight: 81.9 kg  Vital Signs: Temp: 97.6 F (36.4 C) (09/19 0447) Temp Source: Oral (09/19 0447) BP: 139/86 (09/19 0447) Pulse Rate: 87 (09/19 0447)  Labs: Recent Labs    10/04/18 0253  10/05/18 0323 10/05/18 1301 10/05/18 1912 10/06/18 0925  HGB 7.4*  --  8.2*  --   --  8.2*  HCT 23.0*  --  24.9*  --   --  26.8*  PLT 385  --  272  --   --  417*  HEPARINUNFRC 0.12*   < > 0.27* 0.43 0.52 0.39  CREATININE 1.39*  --  1.55*  --   --  1.40*   < > = values in this interval not displayed.    Estimated Creatinine Clearance: 67.4 mL/min (A) (by C-G formula based on SCr of 1.4 mg/dL (H)).  Assessment: 58 yr old male started on IV heparin for LV thrombus.  . Heparin level therapeutic, CBC stable  Goal of Therapy:  Heparin level 0.3-0.7 units/ml Monitor platelets by anticoagulation protocol: Yes   Plan:  Continue heparin infusion at 2300 units/hr Monitor daily heparin level, CBC Monitor for signs/sx of bleeding FU for conversion to oral anticoagulant therapy  Thank you Anette Guarneri, PharmD Clinical Pharmacist 10/06/2018,11:04 AM

## 2018-10-07 DIAGNOSIS — D500 Iron deficiency anemia secondary to blood loss (chronic): Secondary | ICD-10-CM

## 2018-10-07 DIAGNOSIS — Z9119 Patient's noncompliance with other medical treatment and regimen: Secondary | ICD-10-CM

## 2018-10-07 DIAGNOSIS — D638 Anemia in other chronic diseases classified elsewhere: Secondary | ICD-10-CM

## 2018-10-07 DIAGNOSIS — E7800 Pure hypercholesterolemia, unspecified: Secondary | ICD-10-CM

## 2018-10-07 DIAGNOSIS — I513 Intracardiac thrombosis, not elsewhere classified: Secondary | ICD-10-CM

## 2018-10-07 LAB — CULTURE, BLOOD (ROUTINE X 2)
Culture: NO GROWTH
Culture: NO GROWTH
Special Requests: ADEQUATE

## 2018-10-07 LAB — CBC
HCT: 27 % — ABNORMAL LOW (ref 39.0–52.0)
Hemoglobin: 8.3 g/dL — ABNORMAL LOW (ref 13.0–17.0)
MCH: 22.7 pg — ABNORMAL LOW (ref 26.0–34.0)
MCHC: 30.7 g/dL (ref 30.0–36.0)
MCV: 73.8 fL — ABNORMAL LOW (ref 80.0–100.0)
Platelets: 454 10*3/uL — ABNORMAL HIGH (ref 150–400)
RBC: 3.66 MIL/uL — ABNORMAL LOW (ref 4.22–5.81)
RDW: 24.5 % — ABNORMAL HIGH (ref 11.5–15.5)
WBC: 10.8 10*3/uL — ABNORMAL HIGH (ref 4.0–10.5)
nRBC: 0 % (ref 0.0–0.2)

## 2018-10-07 LAB — BASIC METABOLIC PANEL
Anion gap: 9 (ref 5–15)
BUN: 14 mg/dL (ref 6–20)
CO2: 23 mmol/L (ref 22–32)
Calcium: 8 mg/dL — ABNORMAL LOW (ref 8.9–10.3)
Chloride: 104 mmol/L (ref 98–111)
Creatinine, Ser: 1.4 mg/dL — ABNORMAL HIGH (ref 0.61–1.24)
GFR calc Af Amer: >60 mL/min (ref >60)
GFR calc non Af Amer: 55 mL/min — ABNORMAL LOW (ref >60)
Glucose, Bld: 120 mg/dL — ABNORMAL HIGH (ref 70–99)
Potassium: 4.2 mmol/L (ref 3.5–5.1)
Sodium: 136 mmol/L (ref 135–145)

## 2018-10-07 LAB — GLUCOSE, CAPILLARY
Glucose-Capillary: 113 mg/dL — ABNORMAL HIGH (ref 70–99)
Glucose-Capillary: 124 mg/dL — ABNORMAL HIGH (ref 70–99)
Glucose-Capillary: 95 mg/dL (ref 70–99)
Glucose-Capillary: 159 mg/dL — ABNORMAL HIGH (ref 70–99)

## 2018-10-07 LAB — MAGNESIUM: Magnesium: 1.8 mg/dL (ref 1.7–2.4)

## 2018-10-07 LAB — HEPARIN LEVEL (UNFRACTIONATED): Heparin Unfractionated: 0.32 IU/mL (ref 0.30–0.70)

## 2018-10-07 NOTE — Progress Notes (Signed)
Physical Therapy Treatment Patient Details Name: Daniel Moon MRN: [id number] DOB: 1960/06/08 Today's Date: 10/07/2018    History of Present Illness Pt is a 58 y.o. male admitted 09/30/18 with nonhealing L transmet amputation wound; worked up for sepsis likely due to osteomyelitis. Echo 9/14 with LV thrombus; heparin level therapeutic range as of 9/18. 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 PMH includes CHF, HTN, Hep-C, DM, PVD.   PT Comments    Pt finally agreeable to OOB mobility with PT. Able to stand and hop on RLE with RW short distance from bed<>recliner; pt requiring minA to stand from lower surface heights. Limited by c/o L residual limb pain when in dependent position. Reinforced educ re: positioning (especially resting L knee in extension), therex, and importance of mobility. Pt adamant about returning home instead of SNF/CIR; reports family able to provide necessary support. Will require rolling walker, bedside commode and wheelchair for safe mobility at home.   Follow Up Recommendations  Home health PT;Supervision/Assistance - 24 hour(adamantly declining CIR/SNF)     Equipment Recommendations  Rolling walker with 5" wheels;3in1 (PT);Wheelchair (measurements PT);Wheelchair cushion (measurements PT)    Recommendations for Other Services       Precautions / Restrictions Precautions Precautions: Fall    Mobility  Bed Mobility Overal bed mobility: Modified Independent             General bed mobility comments: Mod indep with HOB elevated  Transfers Overall transfer level: Needs assistance Equipment used: Rolling walker (2 wheeled) Transfers: Sit to/from Stand Sit to Stand: Min guard;From elevated surface;Min assist         General transfer comment: Min guard to stand to RW from elevated bed height; when standing to RW from lower recliner height, minA to maintain balance when transitioning UE support from armrests to RW  Ambulation/Gait Ambulation/Gait assistance: Min guard Gait  Distance (Feet): 2 Feet Assistive device: Rolling walker (2 wheeled)   Gait velocity: Decreased   General Gait Details: Hops forward and pivotal hops on RLE from bed<>recliner with RW and min guard for balance; pt limited by pain   Stairs             Wheelchair Mobility    Modified Rankin (Stroke Patients Only)       Balance Overall balance assessment: Needs assistance   Sitting balance-[name]: Good       Standing balance-[name]: Poor Standing balance comment: Reliant on UE support                            Cognition Arousal/Alertness: Awake/alert Behavior During Therapy: WFL for tasks assessed/performed Overall Cognitive Status: No family/caregiver present to determine baseline cognitive functioning Area of Impairment: Safety/judgement;Problem solving                         Safety/Judgement: Decreased awareness of safety;Decreased awareness of deficits   Problem Solving: Slow processing General Comments: Appropriate conversation, although poor insight into deficits       Exercises      General Comments General comments (skin integrity, edema, etc.): Reinforced educ re: positioning (especially resting knee in extension), therex and importance of mobility. Pt continues to decline post-acute rehab, states family will be able to assist; plans to have family bump him up 2 steps into home in w/c      Pertinent Vitals/Pain Pain Assessment: Faces Faces Pain Scale: Hurts little more Pain Location: L residual limb Pain Descriptors /  Indicators: Operative site guarding;Grimacing Pain Intervention(s): Monitored during session;Premedicated before session    Home Living                      Prior Function            PT Goals (current goals can now be found in the care plan section) Progress towards PT goals: Progressing toward goals    Frequency    Min 3X/week      PT Plan Current plan remains appropriate     Co-evaluation              AM-PAC PT "6 Clicks" Mobility   Outcome Measure  Help needed turning from your back to your side while in a flat bed without using bedrails?: None Help needed moving from lying on your back to sitting on the side of a flat bed without using bedrails?: None Help needed moving to and from a bed to a chair (including a wheelchair)?: A Little Help needed standing up from a chair using your arms (e.g., wheelchair or bedside chair)?: A Little Help needed to walk in hospital room?: A Little Help needed climbing 3-5 steps with a railing? : Total 6 Click Score: 18    End of Session   Activity Tolerance: Patient limited by pain Patient left: in bed;with call bell/phone within reach;with bed alarm set Nurse Communication: Mobility status PT Visit Diagnosis: Other abnormalities of gait and mobility (R26.89);Pain Pain - Right/Left: Left Pain - part of body: Leg     Time: [id number] PT Time Calculation (min) (ACUTE ONLY): 18 min  Charges:  $Therapeutic Activity: 8-22 mins                    Mabeline Caras, PT, DPT Acute Rehabilitation Services  Pager [phone] Office [place] 10/07/2018, 5:08 PM

## 2018-10-07 NOTE — Progress Notes (Signed)
PROGRESS NOTE    Daniel Moon  [id number] DOB: 1961-01-07 DOA: [id number] PCP: Sanjuana Letters, MD   Brief Narrative:  Daniel Moon is a 58 y.o. BM PMHx DM type II uncontrolled with complication, DM peripheral neuropathy, nonhealing left transmetatarsal amputation wound and limb threatening ischemia   Admitted with sepsis likely due to osteomyelitis. The patient is a poor historian but tells me he started having fevers today at home. He says he was feeling fine until today when he says his family heard him wake up yelling in his sleep. He was found to have a fever and his family called EMS. He last had balloon angioplasty in July and says he was given antibiotics after that procedure but didn't take them since they made him feel sick. Since that time, he has not seen a physician.   ED Course: He was found to be febrile, have a leukocytosis and XR of foot shows evidence of osteomyelitis. He was given IVF but gently due to his history of CHF, and his blood pressure is stable. He was given empiric vancomycin and zosyn.   Subjective: 9/20 last 24 hours afebrile, A/O x4, patient sleepy.  States pain well controlled.  Agrees to work with PT.   Assessment & Plan:   Principal Problem:   MRSA bacteremia Active Problems:   HTN (hypertension)   Diabetes ([place])   HLD (hyperlipidemia)   COPD (chronic obstructive pulmonary disease) (HCC)   Sepsis ([place])   Open wound of left foot   Osteomyelitis (HCC)   Microcytic anemia   PAD (peripheral artery disease) (HCC)   Peripheral neuropathy   Cigarette smoker   S/P transmetatarsal amputation of foot, left (HCC)   Pressure injury of skin   Chronic renal insufficiency, stage 3 (moderate) (HCC)   Paroxysmal atrial fibrillation (HCC)   Unintentional weight loss   Poor dentition  Sepsis secondary to LEFT lower extremity osteomyelitis/MRSA bacteremia -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  Acute on CKD stage II (baseline Cr 1.4) Recent Labs  Lab 10/03/18 0913 10/04/18 0253 10/05/18 0323 10/06/18 0925 10/07/18 0359  CREATININE 1.73* 1.39* 1.55* 1.40* 1.40*  - At baseline  Hyperkalemia -Potassium previously 5.4 no EKG changes -Resolved  Metabolic acidosis -[id number] sodium bicarb 50 ml/hr -Resolving  Iron deficiency anemia/anemia of chronic disease -Presented hemoglobin 6.9  - Appears multiple transfusions -Iron studies done on 09/30/2018 significant for iron deficiency -Hold off IV iron supplement in the setting of active infective process -Start p.o. ferrous sulfate 325 daily Recent Labs  Lab 10/03/18 0913 10/04/18 0253 10/05/18 0323 10/06/18 0925 10/07/18 0359  HGB 8.0* 7.4* 8.2* 8.2* 8.3*    Peripheral vascular disease ABI ordered and pending Hold off Plavix given need for full dose anticoagulation, could consider 81 aspirin at discharge  Chronic systolic CHF -EF 20 to [id number] -Strict in and out +8.8 L -Daily weight Filed Weights   10/01/18 1930 10/05/18 1118 10/06/18 2201  Weight: 81.9 kg 81.9 kg 82.1 kg  -Seen at [facility] -TTE remarkable for 2.6 x 2 cm LV thrombus - On heparin drip.  Next week will need to discuss with  patient and NCM what medication patient can afford for long-term anticoagulation  Diabetes type 2 uncontrolled with complication -[id number] hemoglobin A1c= 8.4 - Moderate SSI  HLD - 9/19 LDL 71 -Continue Lipitor 40 mg daily  Noncompliance medication - We will need to counsel patient extensively concerning his noncompliance with medications prior  to discharge.  Belligerence toward staff - Counseled patient at length with his wife present that is belligerent and cursing at staff members would not be tolerated.  If it continues to occur patient will be discharged.  Goals of Care  -9/20 Consult to PT/OT placed;  eval for CIR vs SNF     DVT prophylaxis: Heparin drip Code Status: Full Family Communication: 9/20  None  Disposition Plan: TBD     Consultants:  Vascular surgery ID Cardiology    Procedures/Significant Events:  9/15 s/p LEFT BKA   I have personally reviewed and interpreted all radiology studies and my findings are as above.  VENTILATOR SETTINGS:    Cultures 9/13 blood LEFT antecubital positive MRSA 9/13 blood RIGHT forearm positive MRSA 9/13 MRSA by PCR negative 9/15 blood LEFT hand NGTD x2     Antimicrobials: Anti-infectives (From admission, onward)   Start     Stop   10/06/18 1000  vancomycin (VANCOCIN) 1,250 mg in sodium chloride 0.9 % 250 mL IVPB         10/05/18 0000  linezolid (ZYVOX) 600 MG tablet     10/19/18 2359   10/02/18 1530  piperacillin-tazobactam (ZOSYN) IVPB 3.375 g  Status:  Discontinued     10/02/18 1520   10/02/18 1530  piperacillin-tazobactam (ZOSYN) IVPB 3.375 g     10/02/18 1535   10/01/18 2200  vancomycin (VANCOCIN) 1,250 mg in sodium chloride 0.9 % 250 mL IVPB  Status:  Discontinued     10/05/18 0000   09/30/18 2200  vancomycin (VANCOCIN) 1,750 mg in sodium chloride 0.9 % 500 mL IVPB  Status:  Discontinued     10/01/18 1035   09/30/18 0600  piperacillin-tazobactam (ZOSYN) IVPB 3.375 g  Status:  Discontinued     10/02/18 1012   09/30/18 0145  piperacillin-tazobactam (ZOSYN) IVPB 3.375 g     09/30/18 0203   09/30/18 0145  vancomycin (VANCOCIN) 1,750 mg in sodium chloride 0.9 % 500 mL IVPB     09/30/18 0206       Devices    LINES / TUBES:      Continuous Infusions: . sodium chloride    . heparin 2,300 Units/hr (10/07/18 0951)  . sodium bicarbonate 150  mEq in dextrose 5% 1000 mL 150 mEq (10/07/18 0951)  . vancomycin 1,250 mg (10/06/18 1006)     Objective: Vitals:   10/06/18 0447 10/06/18 1616 10/06/18 2201 10/07/18 0506  BP: 139/86 122/76 (!) 132/101 (!) 141/96  Pulse: 87 77 85 87  Resp: 19 18 19 18   Temp: 97.6 F (36.4 C) 97.6 F (36.4 C) 98.4 F (36.9 C) 98.2 F (36.8 C)  TempSrc: Oral Axillary Oral Oral  SpO2: 100% 93% 95% 98%  Weight:   82.1 kg   Height:        Intake/Output Summary (Last 24 hours) at 10/07/2018 1030 Last data filed at 10/07/2018 0755 Gross per 24 hour  Intake 2033.59 ml  Output 1400 ml  Net 633.59 ml   Filed Weights   10/01/18 1930 10/05/18 1118 10/06/18 2201  Weight: 81.9 kg 81.9 kg 82.1 kg   Physical Exam:  General: Sleepy but arousable, A/O x4, no acute respiratory  distress Eyes: negative scleral hemorrhage, negative anisocoria, negative icterus ENT: Negative Runny nose, negative gingival bleeding, Neck:  Negative scars, masses, torticollis, lymphadenopathy, JVD Lungs: Clear to auscultation bilaterally without wheezes or crackles Cardiovascular: Regular rate and rhythm, positive holosystolic murmur, normal S1 and S2 Abdomen: negative abdominal pain, nondistended, positive soft, bowel sounds, no rebound, no ascites, no appreciable mass Extremities: LEFT BKA covered and clean negative sign of infection, did not remove dressing Skin: Negative rashes, lesions, ulcers Psychiatric:  Negative depression, negative anxiety, negative fatigue, negative mania  Central nervous system:  Cranial nerves II through XII intact, tongue/uvula midline, all extremities muscle strength 5/5, sensation intact throughout, negative dysarthria, negative expressive aphasia, negative receptive aphasia.   .     Data Reviewed: Care during the described time interval was provided by me .  I have reviewed this patient's available data, including medical history, events of note, physical examination, and all test results as  part of my evaluation.   CBC: Recent Labs  Lab 10/01/18 0236 10/03/18 0913 10/04/18 0253 10/05/18 0323 10/06/18 0925 10/07/18 0359  WBC 15.8* 13.3* 12.7* 11.4* 11.3* 10.8*  NEUTROABS 11.7*  --   --   --   --   --   HGB 8.2* 8.0* 7.4* 8.2* 8.2* 8.3*  HCT 26.2* 26.0* 23.0* 24.9* 26.8* 27.0*  MCV 70.8* 73.0* 70.8* 70.7* 74.0* 73.8*  PLT 386 392 385 272 417* [id number]*   Basic Metabolic Panel: Recent Labs  Lab 10/03/18 0913 10/04/18 0253 10/05/18 0323 10/06/18 0925 10/07/18 0359  NA 135 134* 135 135 136  K 4.6 4.4 4.5 4.2 4.2  CL 109 109 110 106 104  CO2 18* 17* 17* 19* 23  GLUCOSE 119* 126* 76 134* 120*  BUN 23* 19 18 15 14   CREATININE 1.73* 1.39* 1.55* 1.40* 1.40*  CALCIUM 7.8* 7.6* 7.7* 8.0* 8.0*  MG  --   --  1.8 1.8 1.8  PHOS  --   --   --  3.2  --    GFR: Estimated Creatinine Clearance: 67.6 mL/min (A) (by C-G formula based on SCr of 1.4 mg/dL (H)). Liver Function Tests: No results for input(s): AST, ALT, ALKPHOS, BILITOT, PROT, ALBUMIN in the last 168 hours. No results for input(s): LIPASE, AMYLASE in the last 168 hours. No results for input(s): AMMONIA in the last 168 hours. Coagulation Profile: No results for input(s): INR, PROTIME in the last 168 hours. Cardiac Enzymes: No results for input(s): CKTOTAL, CKMB, CKMBINDEX, TROPONINI in the last 168 hours. BNP (last 3 results) No results for input(s): PROBNP in the last 8760 hours. HbA1C: No results for input(s): HGBA1C in the last 72 hours. CBG: Recent Labs  Lab 10/05/18 2216 10/06/18 1218 10/06/18 1702 10/06/18 2158 10/07/18 0751  GLUCAP 192* 138* 116* 169* 113*   Lipid Profile: Recent Labs    10/06/18 0925  CHOL 97  HDL 18*  LDLCALC 65  TRIG 71  CHOLHDL 5.4   Thyroid Function Tests: No results for input(s): TSH, T4TOTAL, FREET4, T3FREE, THYROIDAB in the last 72 hours. Anemia Panel: No results for input(s): VITAMINB12, FOLATE, FERRITIN, TIBC, IRON, RETICCTPCT in the last 72 hours. Urine  analysis:    Component Value Date/Time   COLORURINE AMBER (A) 09/30/2018 0051   APPEARANCEUR CLOUDY (A) 09/30/2018 0051   LABSPEC 1.024 09/30/2018 0051   PHURINE 5.0 09/30/2018 0051   GLUCOSEU NEGATIVE 09/30/2018 0051   HGBUR NEGATIVE 09/30/2018 0051   BILIRUBINUR NEGATIVE 09/30/2018 [place] 09/30/2018 0051   PROTEINUR  100 (A) 09/30/2018 0051   NITRITE NEGATIVE 09/30/2018 0051   LEUKOCYTESUR NEGATIVE 09/30/2018 0051   Sepsis Labs: @LABRCNTIP (procalcitonin:4,lacticidven:4)  ) Recent Results (from the past 240 hour(s))  Culture, blood (Routine x 2)     Status: Abnormal   Collection Time: 09/30/18 12:54 AM   Specimen: BLOOD  Result Value Ref Range Status   Specimen Description   Final    BLOOD LEFT ANTECUBITAL Performed at [facility] [address], [place], [place] [address]    Special Requests   Final    BOTTLES DRAWN AEROBIC AND ANAEROBIC Blood Culture results may not be optimal due to an inadequate volume of blood received in culture bottles Performed at [facility] [address], [place], [place] [address]    Culture  Setup Time   Final    GRAM POSITIVE COCCI IN CLUSTERS IN BOTH AEROBIC AND ANAEROBIC BOTTLES CRITICAL RESULT CALLED TO, READ BACK BY AND VERIFIED WITH: J. GRIMSLEY, PHARMD (WL) AT 2110 ON 09/30/18 BY C. JESSUP, MT.    Culture (A)  Final    STAPHYLOCOCCUS AUREUS SUSCEPTIBILITIES PERFORMED ON PREVIOUS CULTURE WITHIN THE LAST 5 DAYS. Performed at [facility], [place] [address], [place], [place] [address]    Report Status 10/02/2018 FINAL  Final  Culture, blood (Routine x 2)     Status: Abnormal   Collection Time: 09/30/18  1:30 AM   Specimen: BLOOD RIGHT FOREARM  Result Value Ref Range Status   Specimen Description   Final    BLOOD RIGHT FOREARM Performed at [facility] [address], [place], [place] [address]    Special Requests   Final    BOTTLES DRAWN AEROBIC AND  ANAEROBIC Blood Culture adequate volume Performed at [facility] [address], [place], [place] [address]    Culture  Setup Time   Final    GRAM POSITIVE COCCI IN CLUSTERS IN BOTH AEROBIC AND ANAEROBIC BOTTLES CRITICAL RESULT CALLED TO, READ BACK BY AND VERIFIED WITH: Lavell Luster, PHARMD (WL) AT 2110 ON 09/30/18 BY C. JESSUP, MT. Performed at [facility], [place] [address] [place],  [address]    Culture METHICILLIN RESISTANT STAPHYLOCOCCUS AUREUS (A)  Final   Report Status 10/02/2018 FINAL  Final   Organism ID, Bacteria METHICILLIN RESISTANT STAPHYLOCOCCUS AUREUS  Final      Susceptibility   Methicillin resistant staphylococcus aureus - MIC*    CIPROFLOXACIN >=8 RESISTANT Resistant     ERYTHROMYCIN >=8 RESISTANT Resistant     GENTAMICIN <=0.5 SENSITIVE Sensitive     OXACILLIN >=4 RESISTANT Resistant     TETRACYCLINE <=1 SENSITIVE Sensitive     VANCOMYCIN 1 SENSITIVE Sensitive     TRIMETH/SULFA <=10 SENSITIVE Sensitive     CLINDAMYCIN <=0.25 SENSITIVE Sensitive     RIFAMPIN <=0.5 SENSITIVE Sensitive     Inducible Clindamycin NEGATIVE Sensitive     * METHICILLIN RESISTANT STAPHYLOCOCCUS AUREUS  Blood Culture ID Panel (Reflexed)     Status: Abnormal   Collection Time: 09/30/18  1:30 AM  Result Value Ref Range Status   Enterococcus species NOT DETECTED NOT DETECTED Final   Listeria monocytogenes NOT DETECTED NOT DETECTED Final   Staphylococcus species DETECTED (A) NOT DETECTED Final    Comment: CRITICAL RESULT CALLED TO, READ BACK BY AND VERIFIED WITH: J. GRIMSLEY, PHARMD (WL) AT 2110 ON 09/30/18 BY C. JESSUP, MT.    Staphylococcus aureus (BCID) DETECTED (A) NOT DETECTED Final    Comment: Methicillin (  oxacillin)-resistant Staphylococcus aureus (MRSA). MRSA is predictably resistant to beta-lactam antibiotics (except ceftaroline). Preferred therapy is vancomycin unless clinically contraindicated. Patient requires contact precautions if  hospitalized.  CRITICAL RESULT CALLED TO, READ BACK BY AND VERIFIED WITH: J. GRIMSLEY, PHARMD (WL) AT 2110 ON 09/30/18 BY C. JESSUP, MT.    Methicillin resistance DETECTED (A) NOT DETECTED Final    Comment: CRITICAL RESULT CALLED TO, READ BACK BY AND VERIFIED WITH: J. GRIMSLEY, PHARMD (WL) AT 2110 ON 09/30/18 BY C. JESSUP, MT.    Streptococcus species NOT DETECTED NOT DETECTED Final   Streptococcus agalactiae NOT DETECTED NOT DETECTED Final   Streptococcus pneumoniae NOT DETECTED NOT DETECTED Final   Streptococcus pyogenes NOT DETECTED NOT DETECTED Final   Acinetobacter baumannii NOT DETECTED NOT DETECTED Final   Enterobacteriaceae species NOT DETECTED NOT DETECTED Final   Enterobacter cloacae complex NOT DETECTED NOT DETECTED Final   Escherichia coli NOT DETECTED NOT DETECTED Final   Klebsiella oxytoca NOT DETECTED NOT DETECTED Final   Klebsiella pneumoniae NOT DETECTED NOT DETECTED Final   Proteus species NOT DETECTED NOT DETECTED Final   Serratia marcescens NOT DETECTED NOT DETECTED Final   Haemophilus influenzae NOT DETECTED NOT DETECTED Final   Neisseria meningitidis NOT DETECTED NOT DETECTED Final   Pseudomonas aeruginosa NOT DETECTED NOT DETECTED Final   Candida albicans NOT DETECTED NOT DETECTED Final   Candida glabrata NOT DETECTED NOT DETECTED Final   Candida krusei NOT DETECTED NOT DETECTED Final   Candida parapsilosis NOT DETECTED NOT DETECTED Final   Candida tropicalis NOT DETECTED NOT DETECTED Final    Comment: Performed at [facility], [place]. [address], [place], [place] [address]  SARS Coronavirus 2 [facility] order, Performed in [facility] hospital lab) Nasopharyngeal Nasopharyngeal Swab     Status: None   Collection Time: 09/30/18  1:40 AM   Specimen: Nasopharyngeal Swab  Result Value Ref Range Status   SARS Coronavirus 2 NEGATIVE NEGATIVE Final    Comment: (NOTE) If result is NEGATIVE SARS-CoV-2 target nucleic acids are NOT DETECTED. The SARS-CoV-2 RNA is generally  detectable in upper and lower  respiratory specimens during the acute phase of infection. The lowest  concentration of SARS-CoV-2 viral copies this assay can detect is 250  copies / mL. A negative result does not preclude SARS-CoV-2 infection  and should not be used as the sole basis for treatment or other  patient management decisions.  A negative result may occur with  improper specimen collection / handling, submission of specimen other  than nasopharyngeal swab, presence of viral mutation(s) within the  areas targeted by this assay, and inadequate number of viral copies  (<250 copies / mL). A negative result must be combined with clinical  observations, patient history, and epidemiological information. If result is POSITIVE SARS-CoV-2 target nucleic acids are DETECTED. The SARS-CoV-2 RNA is generally detectable in upper and lower  respiratory specimens dur ing the acute phase of infection.  Positive  results are indicative of active infection with SARS-CoV-2.  Clinical  correlation with patient history and other diagnostic information is  necessary to determine patient infection status.  Positive results do  not rule out bacterial infection or co-infection with other viruses. If result is PRESUMPTIVE POSTIVE SARS-CoV-2 nucleic acids MAY BE PRESENT.   A presumptive positive result was obtained on the submitted specimen  and confirmed on repeat testing.  While 2019 novel coronavirus  (SARS-CoV-2) nucleic acids may be present in the submitted sample  additional confirmatory testing may be necessary for  epidemiological  and / or clinical management purposes  to differentiate between  SARS-CoV-2 and other Sarbecovirus currently known to infect humans.  If clinically indicated additional testing with an alternate test  methodology [phone]) is advised. The SARS-CoV-2 RNA is generally  detectable in upper and lower respiratory sp ecimens during the acute  phase of infection. The  expected result is Negative. Fact Sheet for Patients:  StrictlyIdeas.no Fact Sheet for Healthcare Providers: BankingDealers.co.za This test is not yet approved or cleared by the [place] FDA and has been authorized for detection and/or diagnosis of SARS-CoV-2 by FDA under an Emergency Use Authorization (EUA).  This EUA will remain in effect (meaning this test can be used) for the duration of the COVID-19 declaration under Section 564(b)(1) of the Act, 21 U.S.C. section 360bbb-3(b)(1), unless the authorization is terminated or revoked sooner. Performed at [facility], [place] [address], [place], [place] [address]   MRSA PCR Screening     Status: None   Collection Time: 09/30/18  4:01 AM   Specimen: Nasal Mucosa; Nasopharyngeal  Result Value Ref Range Status   MRSA by PCR NEGATIVE NEGATIVE Final    Comment:        The GeneXpert MRSA Assay (FDA approved for NASAL specimens only), is one component of a comprehensive MRSA colonization surveillance program. It is not intended to diagnose MRSA infection nor to guide or monitor treatment for MRSA infections. Performed at [facility], [place] [address], [place], [place] [address]   Surgical pcr screen     Status: None   Collection Time: 10/02/18  7:31 AM   Specimen: Nasal Mucosa; Nasal Swab  Result Value Ref Range Status   MRSA, PCR NEGATIVE NEGATIVE Final   Staphylococcus aureus NEGATIVE NEGATIVE Final    Comment: (NOTE) The Xpert SA Assay (FDA approved for NASAL specimens in patients 89 years of age and older), is one component of a comprehensive surveillance program. It is not intended to diagnose infection nor to guide or monitor treatment. Performed at [facility], [place] [address], [place], [place] [address]   Culture, blood (routine x 2)     Status: None (Preliminary result)   Collection Time: 10/02/18  7:19 PM   Specimen: BLOOD LEFT  HAND  Result Value Ref Range Status   Specimen Description BLOOD LEFT HAND  Final   Special Requests   Final    BOTTLES DRAWN AEROBIC AND ANAEROBIC Blood Culture results may not be optimal due to an inadequate volume of blood received in culture bottles   Culture   Final    NO GROWTH 4 DAYS Performed at [facility], [place] [address] [place], [place] [address]    Report Status PENDING  Incomplete  Culture, blood (routine x 2)     Status: None (Preliminary result)   Collection Time: 10/02/18  9:02 PM   Specimen: BLOOD LEFT HAND  Result Value Ref Range Status   Specimen Description BLOOD LEFT HAND  Final   Special Requests   Final    BOTTLES DRAWN AEROBIC AND ANAEROBIC Blood Culture adequate volume   Culture   Final    NO GROWTH 4 DAYS Performed at [facility], [place] [address] [place],  [address]    Report Status PENDING  Incomplete         Radiology Studies: No results found.      Scheduled Meds: . amLODipine  10 mg Oral Daily  . atorvastatin  40  mg Oral QHS  . carvedilol  12.5 mg Oral BID  . Chlorhexidine Gluconate Cloth  6 each Topical Daily  . DULoxetine  30 mg Oral Daily  . ferrous sulfate  325 mg Oral Q breakfast  . gabapentin  300 mg Oral TID  . insulin aspart  0-15 Units Subcutaneous TID WC  . insulin NPH Human  10 Units Subcutaneous BID AC & HS  . morphine  15 mg Oral Q12H  . nicotine  21 mg Transdermal Daily  . polyethylene glycol  17 g Oral Daily  . senna-docusate  2 tablet Oral BID   Continuous Infusions: . sodium chloride    . heparin 2,300 Units/hr (10/07/18 0951)  . sodium bicarbonate 150 mEq in dextrose 5% 1000 mL 150 mEq (10/07/18 0951)  . vancomycin 1,250 mg (10/06/18 1006)     LOS: 7 days   The patient is critically ill with multiple organ systems failure and requires high complexity decision making for assessment and support, frequent evaluation and titration of therapies, application of advanced monitoring technologies  and extensive interpretation of multiple databases. Critical Care Time devoted to patient care services described in this note  Time spent: 40 minutes     Keslee Harrington, Geraldo Docker, MD [facility] Pager [phone]  If 7PM-7AM, please contact night-coverage [email or web address] Password [email or web address] 10/07/2018, 10:30 AM

## 2018-10-07 NOTE — Progress Notes (Addendum)
Pharmacy Antibiotic Note  Daniel Moon is a 58 y.o. male admitted on 09/30/2018 with MRSA bacteremia and osteomyelitis. Continues on Vancomycin  Last levels 9/17 Scr stable  Plan: Continue Vancomycin 1250 mg iv Q 48 hours Planning to switch to Zyvox at discharge?  Height: 6\' 3"  (190.5 cm) Weight: 181 lb (82.1 kg) IBW/kg (Calculated) : 84.5  Temp (24hrs), Avg:98.1 F (36.7 C), Min:97.6 F (36.4 C), Max:98.4 F (36.9 C)  Recent Labs  Lab 10/03/18 0913 10/04/18 0253 10/04/18 2310 10/05/18 0323 10/06/18 0925 10/07/18 0359  WBC 13.3* 12.7*  --  11.4* 11.3* 10.8*  CREATININE 1.73* 1.39*  --  1.55* 1.40* 1.40*  VANCOTROUGH  --   --  29*  --   --   --   VANCOPEAK  --  46*  --   --   --   --     Estimated Creatinine Clearance: 67.6 mL/min (A) (by C-G formula based on SCr of 1.4 mg/dL (H)).    Allergies  Allergen Reactions  . Amoxicillin-Pot Clavulanate Nausea And Vomiting    Did it involve swelling of the face/tongue/throat, SOB, or low BP? No Did it involve sudden or severe rash/hives, skin peeling, or any reaction on the inside of your mouth or nose? No Did you need to seek medical attention at a hospital or doctor's office? No When did it last happen?6 months ago If all above answers are "NO", may proceed with cephalosporin use.     Thank you for allowing pharmacy to be a part of this patient's care. Anette Guarneri, PharmD  10/07/2018 7:46 AM

## 2018-10-07 NOTE — Progress Notes (Signed)
ANTICOAGULATION CONSULT NOTE - Follow Up Consult  Pharmacy Consult for Heparin Indication: LV thrombus  Allergies  Allergen Reactions  . Amoxicillin-Pot Clavulanate Nausea And Vomiting    Did it involve swelling of the face/tongue/throat, SOB, or low BP? No Did it involve sudden or severe rash/hives, skin peeling, or any reaction on the inside of your mouth or nose? No Did you need to seek medical attention at a hospital or doctor's office? No When did it last happen?6 months ago If all above answers are "NO", may proceed with cephalosporin use.    Patient Measurements: Height: 6\' 3"  (190.5 cm) Weight: 181 lb (82.1 kg) IBW/kg (Calculated) : 84.5 Heparin Dosing Weight: 81.9 kg  Vital Signs: Temp: 98.2 F (36.8 C) (09/20 0506) Temp Source: Oral (09/20 0506) BP: 141/96 (09/20 0506) Pulse Rate: 87 (09/20 0506)  Labs: Recent Labs    10/05/18 0323  10/05/18 1912 10/06/18 0925 10/07/18 0359  HGB 8.2*  --   --  8.2* 8.3*  HCT 24.9*  --   --  26.8* 27.0*  PLT 272  --   --  417* 454*  HEPARINUNFRC 0.27*   < > 0.52 0.39 0.32  CREATININE 1.55*  --   --  1.40* 1.40*   < > = values in this interval not displayed.    Estimated Creatinine Clearance: 67.6 mL/min (A) (by C-G formula based on SCr of 1.4 mg/dL (H)).  Assessment: 58 yr old male started on IV heparin for LV thrombus.  . Heparin level therapeutic, CBC stable  Goal of Therapy:  Heparin level 0.3-0.7 units/ml Monitor platelets by anticoagulation protocol: Yes   Plan:  Continue heparin infusion at 2300 units/hr Monitor daily heparin level, CBC Monitor for signs/sx of bleeding FU for conversion to oral anticoagulant therapy  Thank you Anette Guarneri, PharmD Clinical Pharmacist 10/07/2018,7:45 AM

## 2018-10-08 DIAGNOSIS — G589 Mononeuropathy, unspecified: Secondary | ICD-10-CM

## 2018-10-08 DIAGNOSIS — J430 Unilateral pulmonary emphysema [MacLeod's syndrome]: Secondary | ICD-10-CM

## 2018-10-08 DIAGNOSIS — Z765 Malingerer [conscious simulation]: Secondary | ICD-10-CM

## 2018-10-08 LAB — GLUCOSE, CAPILLARY
Glucose-Capillary: 115 mg/dL — ABNORMAL HIGH (ref 70–99)
Glucose-Capillary: 118 mg/dL — ABNORMAL HIGH (ref 70–99)
Glucose-Capillary: 117 mg/dL — ABNORMAL HIGH (ref 70–99)
Glucose-Capillary: 115 mg/dL — ABNORMAL HIGH (ref 70–99)

## 2018-10-08 LAB — BASIC METABOLIC PANEL
Anion gap: 10 (ref 5–15)
BUN: 11 mg/dL (ref 6–20)
CO2: 21 mmol/L — ABNORMAL LOW (ref 22–32)
Calcium: 8.1 mg/dL — ABNORMAL LOW (ref 8.9–10.3)
Chloride: 103 mmol/L (ref 98–111)
Creatinine, Ser: 1.27 mg/dL — ABNORMAL HIGH (ref 0.61–1.24)
GFR calc Af Amer: >60 mL/min (ref >60)
GFR calc non Af Amer: >60 mL/min (ref >60)
Glucose, Bld: 133 mg/dL — ABNORMAL HIGH (ref 70–99)
Potassium: 3.9 mmol/L (ref 3.5–5.1)
Sodium: 134 mmol/L — ABNORMAL LOW (ref 135–145)

## 2018-10-08 LAB — CBC
HCT: 27.7 % — ABNORMAL LOW (ref 39.0–52.0)
Hemoglobin: 8.6 g/dL — ABNORMAL LOW (ref 13.0–17.0)
MCH: 22.8 pg — ABNORMAL LOW (ref 26.0–34.0)
MCHC: 31 g/dL (ref 30.0–36.0)
MCV: 73.5 fL — ABNORMAL LOW (ref 80.0–100.0)
Platelets: 419 10*3/uL — ABNORMAL HIGH (ref 150–400)
RBC: 3.77 MIL/uL — ABNORMAL LOW (ref 4.22–5.81)
RDW: 24.5 % — ABNORMAL HIGH (ref 11.5–15.5)
WBC: 9.5 10*3/uL (ref 4.0–10.5)
nRBC: 0 % (ref 0.0–0.2)

## 2018-10-08 LAB — HEPARIN LEVEL (UNFRACTIONATED): Heparin Unfractionated: 0.41 IU/mL (ref 0.30–0.70)

## 2018-10-08 LAB — MAGNESIUM: Magnesium: 1.7 mg/dL (ref 1.7–2.4)

## 2018-10-08 MED ORDER — HALOPERIDOL LACTATE 5 MG/ML IJ SOLN
2.0000 mg | Freq: Four times a day (QID) | INTRAMUSCULAR | Status: DC
  Administered 2018-10-08 – 2018-10-10 (×8): 2 mg via INTRAVENOUS
  Filled 2018-10-08 (×8): qty 1

## 2018-10-08 MED ORDER — MORPHINE SULFATE (PF) 2 MG/ML IV SOLN
1.0000 mg | INTRAVENOUS | Status: DC | PRN
  Administered 2018-10-08 (×2): 2 mg via INTRAVENOUS
  Administered 2018-10-08: 3 mg via INTRAVENOUS
  Administered 2018-10-09: 14:00:00 2 mg via INTRAVENOUS
  Administered 2018-10-09: 04:00:00 3 mg via INTRAVENOUS
  Administered 2018-10-09: 2 mg via INTRAVENOUS
  Administered 2018-10-10 (×3): 3 mg via INTRAVENOUS
  Filled 2018-10-08 (×3): qty 2
  Filled 2018-10-08: qty 1
  Filled 2018-10-08: qty 2
  Filled 2018-10-08: qty 1
  Filled 2018-10-08: qty 2
  Filled 2018-10-08 (×2): qty 1

## 2018-10-08 NOTE — Progress Notes (Signed)
Pt medicated with 4mg  IV morphine at 0513 for left leg pain. Marland Kitchen He called back 5-10 minutes later screaming that he was in pain. Medicated with 1mg  Morphine at 0528 for the maximum dose ordered of 5mg . He was medicated with 2 Percocet at 0348. Pt continues to yell that he is in pain. He states that he needs Methadone. He says he has been to the [facility] clinic in the past.  Notified X. Blount. No new orders at this time.

## 2018-10-08 NOTE — Progress Notes (Signed)
   VASCULAR SURGERY ASSESSMENT & PLAN:   POD6Left BKA:His left below the knee amputation site looks fine without erythema or drainage. Continue daily dressing changes.  ANTICOAGULATION:He is back on heparin for his ventricular thrombus.  SUBJECTIVE:   He says he is not getting enough pain medicine.  He has MS Contin, Percocet, and morphine ordered.  PHYSICAL EXAM:   Vitals:   10/07/18 0506 10/07/18 1559 10/07/18 2106 10/08/18 0642  BP: (!) 141/96 (!) 136/92 133/89 (!) 141/95  Pulse: 87 85 84 85  Resp: 18 18 18 18   Temp: 98.2 F (36.8 C) 97.9 F (36.6 C) (!) 97.5 F (36.4 C) 97.9 F (36.6 C)  TempSrc: Oral Oral Oral Oral  SpO2: 98% 97% 97% 94%  Weight:      Height:       His left below the knee amputation site is healing fine.  LABS:   Lab Results  Component Value Date   WBC 9.5 10/08/2018   HGB 8.6 (L) 10/08/2018   HCT 27.7 (L) 10/08/2018   MCV 73.5 (L) 10/08/2018   PLT 419 (H) 10/08/2018   Lab Results  Component Value Date   CREATININE 1.27 (H) 10/08/2018   Lab Results  Component Value Date   INR 1.4 (H) 09/30/2018   CBG (last 3)  Recent Labs    10/07/18 1649 10/07/18 2131 10/08/18 0804  GLUCAP 95 159* 115*    PROBLEM LIST:    Principal Problem:   MRSA bacteremia Active Problems:   HTN (hypertension)   Diabetes ([place])   HLD (hyperlipidemia)   COPD (chronic obstructive pulmonary disease) (H[facility])   Sepsis ([place])   Open wound of left foot   Osteomyelitis (H[facility])   Microcytic anemia   PAD (peripheral artery disease) (H[facility])   Peripheral neuropathy   Cigarette smoker   S/P transmetatarsal amputation of foot, left (HCC)   Pressure injury of skin   Chronic renal insufficiency, stage 3 (moderate) (HCC)   Paroxysmal atrial fibrillation (H[facility])   Unintentional weight loss   Poor dentition   CURRENT MEDS:   . amLODipine  10 mg Oral Daily  . atorvastatin  40 mg Oral QHS  . carvedilol  12.5 mg Oral BID  . Chlorhexidine Gluconate Cloth  6 each  Topical Daily  . DULoxetine  30 mg Oral Daily  . ferrous sulfate  325 mg Oral Q breakfast  . gabapentin  300 mg Oral TID  . haloperidol lactate  2 mg Intravenous Q6H  . insulin aspart  0-15 Units Subcutaneous TID WC  . insulin NPH Human  10 Units Subcutaneous BID AC & HS  . morphine  15 mg Oral Q12H  . nicotine  21 mg Transdermal Daily  . polyethylene glycol  17 g Oral Daily  . senna-docusate  2 tablet Oral BID    Deitra Mayo Beeper: [id number] Office: [phone] 10/08/2018

## 2018-10-08 NOTE — TOC Progression Note (Signed)
Transition of Care [facility]) - Progression Note    Patient Details  Name: Daniel Moon MRN: [id number] Date of Birth: May 04, 1960  [facility] CM/SW Contact  Jacalyn Lefevre Edson Snowball, RN Phone Number: 10/08/2018, 12:22 PM  Clinical Narrative:     See previous note.   Working on [facility] and PT. Will need orders and face to face .  Patient has no preference in home health agency.   Mateo Flow with [facility] declined referral.  Tommi Rumps with [facility] declined referral.  Expected Discharge Plan: [place] Barriers to Discharge: Continued Medical Work up  Expected Discharge Plan and Services Expected Discharge Plan: [place] Choice: [place] arrangements for the past 2 months: Single Family Home                                       Social Determinants of Health (SDOH) Interventions    Readmission Risk Interventions No flowsheet data found.

## 2018-10-08 NOTE — Progress Notes (Signed)
PROGRESS NOTE    Homas Franssen  [id number] DOB: 01-31-1960 DOA: [id number] PCP: Sanjuana Letters, MD   Brief Narrative:  Daniel Moon is a 58 y.o. BM PMHx DM type II uncontrolled with complication, DM peripheral neuropathy, nonhealing left transmetatarsal amputation wound and limb threatening ischemia   Admitted with sepsis likely due to osteomyelitis. The patient is a poor historian but tells me he started having fevers today at home. He says he was feeling fine until today when he says his family heard him wake up yelling in his sleep. He was found to have a fever and his family called EMS. He last had balloon angioplasty in July and says he was given antibiotics after that procedure but didn't take them since they made him feel sick. Since that time, he has not seen a physician.   ED Course: He was found to be febrile, have a leukocytosis and XR of foot shows evidence of osteomyelitis. He was given IVF but gently due to his history of CHF, and his blood pressure is stable. He was given empiric vancomycin and zosyn.   Subjective: 9/21 patient constantly states he is in 10 out of 10 pain yelling and screaming, requesting methadone.  However unable to stay awake even to take his oral medications.  While myself and RN present patient would place p.o. on his mouth fall asleep and have to be awoken to be prompted to finish taking his medication at which point he would again scream that he was in terrible pain before nodding off to sleep again.  Does not participate with PT/OT.  Asked to describe his pain, refuses to describe type of pain then drifts off to sleep.    Assessment & Plan:   Principal Problem:   MRSA bacteremia Active Problems:   HTN (hypertension)   Diabetes ([place])   HLD (hyperlipidemia)   COPD (chronic obstructive pulmonary disease) (HCC)   Sepsis ([place])   Open wound of left foot   Osteomyelitis (HCC)   Microcytic anemia   PAD (peripheral artery disease) (HCC)  Peripheral neuropathy   Cigarette smoker   S/P transmetatarsal amputation of foot, left (HCC)   Pressure injury of skin   Chronic renal insufficiency, stage 3 (moderate) (HCC)   Paroxysmal atrial fibrillation (HCC)   Unintentional weight loss   Poor dentition  Sepsis secondary to LEFT lower extremity osteomyelitis/MRSA bacteremia -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 - Patient unable to stay awake long enough to take p.o. medication yet states in terrible pain.  Sleeps throughout the day refusing to participate PT/OT. - Upon observation patient will either be lying in bed comfortably or sleep. - Unable to describe what his pain feels like and again demands methadone or increasing amounts of morphine.  LEFT ventricular thrombus insetting proximal atrial fibrillation/medication noncompliance - Continue heparin drip - Patient will require long-term DOAC, will discuss case with cardiology on 9/21.  Acute on CKD stage II (baseline Cr 1.4) Recent Labs  Lab 10/04/18 0253 10/05/18 0323 10/06/18 0925 10/07/18 0359 10/08/18 0228  CREATININE 1.39* 1.55* 1.40* 1.40* 1.27*  - At baseline  Hyperkalemia -Resolved  Metabolic acidosis -[id number] sodium bicarb 50 ml/hr -Improving continue drip  Iron deficiency anemia/anemia of chronic disease -Presented hemoglobin 6.9  - Appears multiple transfusions -Iron studies done on 09/30/2018 significant for iron deficiency -Hold off IV iron supplement in the setting of active infective process -Start p.o. ferrous sulfate 325 daily Recent Labs  Lab 10/04/18 0253 10/05/18 0323 10/06/18 0925  10/07/18 0359 10/08/18 0228  HGB 7.4* 8.2* 8.2* 8.3* 8.6*   Peripheral vascular disease ABI ordered and pending Hold off Plavix given need for full dose anticoagulation, could consider 81 aspirin at discharge  Chronic systolic CHF -EF 20 to [id number] -Strict in and out +8.5 L -Daily weight Filed Weights   10/01/18 1930 10/05/18 1118 10/06/18 2201  Weight: 81.9 kg 81.9 kg 82.1 kg  -[facility] at [facility] -TTE remarkable for 2.6 x 2 cm LV thrombus - On heparin drip.  Next week will need to discuss with patient and NCM what medication patient can afford for long-term anticoagulation  Diabetes type 2 uncontrolled with complication -[id number] hemoglobin A1c= 8.4 - Moderate SSI  HLD - 9/19 LDL 71 -Continue Lipitor 40 mg daily  Noncompliance medication - We will need to counsel patient extensively concerning his noncompliance with medications prior to discharge. -Noncompliance with PT/OT, drug-seeking behavior.  Patient poor candidate for rehab.  Belligerence toward staff - Counseled patient at length with his wife present that is belligerent and cursing at staff members would not be tolerated.  If it continues to occur patient will be discharged. -9/21 Haldol 2 mg QID.  Mood stabilization.  Patient extremely belligerent towards staff, doubt will make any significant progress toward placement (CIR vs SNF) since he refuses to participate.  Patient wants to be totally knocked out all day long.   Goals of Care  -9/20 Consult to PT/OT placed;  eval for CIR vs SNF -9/21 consult to NCM; patient noncompliant with PT/OT, patient drug-seeking.  Patient wants enough narcotics to keep him sleep throughout the day.  Poor candidate for rehab begin search for LTAC    DVT prophylaxis: Heparin drip Code Status: Full Family Communication: 9/20  None  Disposition Plan: TBD     Consultants:  Vascular surgery ID Cardiology    Procedures/Significant Events:  9/15 s/p LEFT BKA   I have  personally reviewed and interpreted all radiology studies and my findings are as above.  VENTILATOR SETTINGS:    Cultures 9/13 blood LEFT antecubital positive MRSA 9/13 blood RIGHT forearm positive MRSA 9/13 MRSA by PCR negative 9/15 blood LEFT hand NGTD x2     Antimicrobials: Anti-infectives (From admission, onward)   Start     Stop   10/06/18 1000  vancomycin (VANCOCIN) 1,250 mg in sodium chloride 0.9 % 250 mL IVPB         10/05/18 0000  linezolid (ZYVOX) 600 MG tablet     10/19/18 2359   10/02/18 1530  piperacillin-tazobactam (ZOSYN) IVPB 3.375 g  Status:  Discontinued     10/02/18 1520   10/02/18 1530  piperacillin-tazobactam (ZOSYN) IVPB 3.375 g     10/02/18 1535   10/01/18 2200  vancomycin (VANCOCIN) 1,250 mg in sodium chloride 0.9 % 250 mL IVPB  Status:  Discontinued     10/05/18 0000   09/30/18 2200  vancomycin (VANCOCIN) 1,750 mg in sodium chloride 0.9 % 500 mL IVPB  Status:  Discontinued     10/01/18 1035   09/30/18 0600  piperacillin-tazobactam (ZOSYN) IVPB 3.375 g  Status:  Discontinued     10/02/18 1012   09/30/18 0145  piperacillin-tazobactam (ZOSYN) IVPB 3.375 g  09/30/18 0203   09/30/18 0145  vancomycin (VANCOCIN) 1,750 mg in sodium chloride 0.9 % 500 mL IVPB     09/30/18 0206       Devices    LINES / TUBES:      Continuous Infusions:  sodium chloride     heparin 2,300 Units/hr (10/07/18 2241)   sodium bicarbonate 150 mEq in dextrose 5% 1000 mL 150 mEq (10/08/18 0732)   vancomycin 1,250 mg (10/06/18 1006)     Objective: Vitals:   10/07/18 0506 10/07/18 1559 10/07/18 2106 10/08/18 0642  BP: (!) 141/96 (!) 136/92 133/89 (!) 141/95  Pulse: 87 85 84 85  Resp: 18 18 18 18   Temp: 98.2 F (36.8 C) 97.9 F (36.6 C) (!) 97.5 F (36.4 C) 97.9 F (36.6 C)  TempSrc: Oral Oral Oral Oral  SpO2: 98% 97% 97% 94%  Weight:      Height:        Intake/Output Summary (Last 24 hours) at 10/08/2018 0817 Last data filed at 10/08/2018 0500 Gross  per 24 hour  Intake 840 ml  Output 650 ml  Net 190 ml   Filed Weights   10/01/18 1930 10/05/18 1118 10/06/18 2201  Weight: 81.9 kg 81.9 kg 82.1 kg    Physical Exam:  General: A/O x4, upon entry into the room hollering in pain, unable to identify/describe pain nor can patient stay awake long enough to take his pain medication.  No acute respiratory distress Eyes: negative scleral hemorrhage, negative anisocoria, negative icterus ENT: Negative Runny nose, negative gingival bleeding, Neck:  Negative scars, masses, torticollis, lymphadenopathy, JVD Lungs: Clear to auscultation bilaterally without wheezes or crackles Cardiovascular: Regular rate and rhythm without murmur gallop or rub normal S1 and S2 Abdomen: negative abdominal pain, nondistended, positive soft, bowel sounds, no rebound, no ascites, no appreciable mass Extremities: LEFT BKA covered and clean negative sign of infection, did not remove dressing  Skin: Negative rashes, lesions, ulcers Psychiatric:  Negative depression, negative anxiety, negative fatigue, negative mania, EXTREMELY POOR UNDERSTANDING current condition. Central nervous system:  Cranial nerves II through XII intact, tongue/uvula midline, all extremities muscle strength 5/5, sensation intact throughout, negative dysarthria, negative expressive aphasia, negative receptive aphasia. .     Data Reviewed: Care during the described time interval was provided by me .  I have reviewed this patient's available data, including medical history, events of note, physical examination, and all test results as part of my evaluation.   CBC: Recent Labs  Lab 10/04/18 0253 10/05/18 0323 10/06/18 0925 10/07/18 0359 10/08/18 0228  WBC 12.7* 11.4* 11.3* 10.8* 9.5  HGB 7.4* 8.2* 8.2* 8.3* 8.6*  HCT 23.0* 24.9* 26.8* 27.0* 27.7*  MCV 70.8* 70.7* 74.0* 73.8* 73.5*  PLT 385 272 417* 454* 123XX123*   Basic Metabolic Panel: Recent Labs  Lab 10/04/18 0253 10/05/18 0323  10/06/18 0925 10/07/18 0359 10/08/18 0228  NA 134* 135 135 136 134*  K 4.4 4.5 4.2 4.2 3.9  CL 109 110 106 104 103  CO2 17* 17* 19* 23 21*  GLUCOSE 126* 76 134* 120* 133*  BUN 19 18 15 14 11   CREATININE 1.39* 1.55* 1.40* 1.40* 1.27*  CALCIUM 7.6* 7.7* 8.0* 8.0* 8.1*  MG  --  1.8 1.8 1.8 1.7  PHOS  --   --  3.2  --   --    GFR: Estimated Creatinine Clearance: 74.5 mL/min (A) (by C-G formula based on SCr of 1.27 mg/dL (H)). Liver Function Tests: No results for input(s): AST, ALT, ALKPHOS,  BILITOT, PROT, ALBUMIN in the last 168 hours. No results for input(s): LIPASE, AMYLASE in the last 168 hours. No results for input(s): AMMONIA in the last 168 hours. Coagulation Profile: No results for input(s): INR, PROTIME in the last 168 hours. Cardiac Enzymes: No results for input(s): CKTOTAL, CKMB, CKMBINDEX, TROPONINI in the last 168 hours. BNP (last 3 results) No results for input(s): PROBNP in the last 8760 hours. HbA1C: No results for input(s): HGBA1C in the last 72 hours. CBG: Recent Labs  Lab 10/07/18 0751 10/07/18 1204 10/07/18 1649 10/07/18 2131 10/08/18 0804  GLUCAP 113* 124* 95 159* 115*   Lipid Profile: Recent Labs    10/06/18 0925  CHOL 97  HDL 18*  LDLCALC 65  TRIG 71  CHOLHDL 5.4   Thyroid Function Tests: No results for input(s): TSH, T4TOTAL, FREET4, T3FREE, THYROIDAB in the last 72 hours. Anemia Panel: No results for input(s): VITAMINB12, FOLATE, FERRITIN, TIBC, IRON, RETICCTPCT in the last 72 hours. Urine analysis:    Component Value Date/Time   COLORURINE AMBER (A) 09/30/2018 0051   APPEARANCEUR CLOUDY (A) 09/30/2018 0051   LABSPEC 1.024 09/30/2018 0051   PHURINE 5.0 09/30/2018 0051   GLUCOSEU NEGATIVE 09/30/2018 0051   HGBUR NEGATIVE 09/30/2018 0051   BILIRUBINUR NEGATIVE 09/30/2018 0051   KETONESUR NEGATIVE 09/30/2018 0051   PROTEINUR 100 (A) 09/30/2018 0051   NITRITE NEGATIVE 09/30/2018 0051   LEUKOCYTESUR NEGATIVE 09/30/2018 0051   Sepsis  Labs: @LABRCNTIP (procalcitonin:4,lacticidven:4)  ) Recent Results (from the past 240 hour(s))  Culture, blood (Routine x 2)     Status: Abnormal   Collection Time: 09/30/18 12:54 AM   Specimen: BLOOD  Result Value Ref Range Status   Specimen Description   Final    BLOOD LEFT ANTECUBITAL Performed at [facility], [place] [address] [place], [place] [address]    Special Requests   Final    BOTTLES DRAWN AEROBIC AND ANAEROBIC Blood Culture results may not be optimal due to an inadequate volume of blood received in culture bottles Performed at [place] [address], [place], [place] [address]    Culture  Setup Time   Final    GRAM POSITIVE COCCI IN CLUSTERS IN BOTH AEROBIC AND ANAEROBIC BOTTLES CRITICAL RESULT CALLED TO, READ BACK BY AND VERIFIED WITH: J. GRIMSLEY, PHARMD (WL) AT 2110 ON 09/30/18 BY C. JESSUP, MT.    Culture (A)  Final    STAPHYLOCOCCUS AUREUS SUSCEPTIBILITIES PERFORMED ON PREVIOUS CULTURE WITHIN THE LAST 5 DAYS. Performed at [facility], [place] [address], [place], [place] [address]    Report Status 10/02/2018 FINAL  Final  Culture, blood (Routine x 2)     Status: Abnormal   Collection Time: 09/30/18  1:30 AM   Specimen: BLOOD RIGHT FOREARM  Result Value Ref Range Status   Specimen Description   Final    BLOOD RIGHT FOREARM Performed at [facility] [address] [place], [place] [address]    Special Requests   Final    BOTTLES DRAWN AEROBIC AND ANAEROBIC Blood Culture adequate volume Performed at [facility] [address] [place], [place] [address]    Culture  Setup Time   Final    GRAM POSITIVE COCCI IN CLUSTERS IN BOTH AEROBIC AND ANAEROBIC BOTTLES CRITICAL RESULT CALLED TO, READ BACK BY AND VERIFIED WITH: Lavell Luster, PHARMD (WL) AT 2110 ON 09/30/18 BY C. JESSUP, MT. Performed at [facility], [place] [address] [place], [place] [address]  Culture METHICILLIN  RESISTANT STAPHYLOCOCCUS AUREUS (A)  Final   Report Status 10/02/2018 FINAL  Final   Organism ID, Bacteria METHICILLIN RESISTANT STAPHYLOCOCCUS AUREUS  Final      Susceptibility   Methicillin resistant staphylococcus aureus - MIC*    CIPROFLOXACIN >=8 RESISTANT Resistant     ERYTHROMYCIN >=8 RESISTANT Resistant     GENTAMICIN <=0.5 SENSITIVE Sensitive     OXACILLIN >=4 RESISTANT Resistant     TETRACYCLINE <=1 SENSITIVE Sensitive     VANCOMYCIN 1 SENSITIVE Sensitive     TRIMETH/SULFA <=10 SENSITIVE Sensitive     CLINDAMYCIN <=0.25 SENSITIVE Sensitive     RIFAMPIN <=0.5 SENSITIVE Sensitive     Inducible Clindamycin NEGATIVE Sensitive     * METHICILLIN RESISTANT STAPHYLOCOCCUS AUREUS  Blood Culture ID Panel (Reflexed)     Status: Abnormal   Collection Time: 09/30/18  1:30 AM  Result Value Ref Range Status   Enterococcus species NOT DETECTED NOT DETECTED Final   Listeria monocytogenes NOT DETECTED NOT DETECTED Final   Staphylococcus species DETECTED (A) NOT DETECTED Final    Comment: CRITICAL RESULT CALLED TO, READ BACK BY AND VERIFIED WITH: J. GRIMSLEY, PHARMD (WL) AT 2110 ON 09/30/18 BY C. JESSUP, MT.    Staphylococcus aureus (BCID) DETECTED (A) NOT DETECTED Final    Comment: Methicillin (oxacillin)-resistant Staphylococcus aureus (MRSA). MRSA is predictably resistant to beta-lactam antibiotics (except ceftaroline). Preferred therapy is vancomycin unless clinically contraindicated. Patient requires contact precautions if  hospitalized. CRITICAL RESULT CALLED TO, READ BACK BY AND VERIFIED WITH: J. GRIMSLEY, PHARMD (WL) AT 2110 ON 09/30/18 BY C. JESSUP, MT.    Methicillin resistance DETECTED (A) NOT DETECTED Final    Comment: CRITICAL RESULT CALLED TO, READ BACK BY AND VERIFIED WITH: J. GRIMSLEY, PHARMD (WL) AT 2110 ON 09/30/18 BY C. JESSUP, MT.    Streptococcus species NOT DETECTED NOT DETECTED Final   Streptococcus agalactiae NOT DETECTED NOT DETECTED Final   Streptococcus pneumoniae  NOT DETECTED NOT DETECTED Final   Streptococcus pyogenes NOT DETECTED NOT DETECTED Final   Acinetobacter baumannii NOT DETECTED NOT DETECTED Final   Enterobacteriaceae species NOT DETECTED NOT DETECTED Final   Enterobacter cloacae complex NOT DETECTED NOT DETECTED Final   Escherichia coli NOT DETECTED NOT DETECTED Final   Klebsiella oxytoca NOT DETECTED NOT DETECTED Final   Klebsiella pneumoniae NOT DETECTED NOT DETECTED Final   Proteus species NOT DETECTED NOT DETECTED Final   Serratia marcescens NOT DETECTED NOT DETECTED Final   Haemophilus influenzae NOT DETECTED NOT DETECTED Final   Neisseria meningitidis NOT DETECTED NOT DETECTED Final   Pseudomonas aeruginosa NOT DETECTED NOT DETECTED Final   Candida albicans NOT DETECTED NOT DETECTED Final   Candida glabrata NOT DETECTED NOT DETECTED Final   Candida krusei NOT DETECTED NOT DETECTED Final   Candida parapsilosis NOT DETECTED NOT DETECTED Final   Candida tropicalis NOT DETECTED NOT DETECTED Final    Comment: Performed at [facility], [place]. [address] [place], [place] [address]  SARS Coronavirus 2 [facility] order, Performed in [facility] hospital lab) Nasopharyngeal Nasopharyngeal Swab     Status: None   Collection Time: 09/30/18  1:40 AM   Specimen: Nasopharyngeal Swab  Result Value Ref Range Status   SARS Coronavirus 2 NEGATIVE NEGATIVE Final    Comment: (NOTE) If result is NEGATIVE SARS-CoV-2 target nucleic acids are NOT DETECTED. The SARS-CoV-2 RNA is generally detectable in upper and lower  respiratory specimens during the acute phase of infection. The lowest  concentration of SARS-CoV-2 viral  copies this assay can detect is 250  copies / mL. A negative result does not preclude SARS-CoV-2 infection  and should not be used as the sole basis for treatment or other  patient management decisions.  A negative result may occur with  improper specimen collection / handling, submission of specimen other  than nasopharyngeal  swab, presence of viral mutation(s) within the  areas targeted by this assay, and inadequate number of viral copies  (<250 copies / mL). A negative result must be combined with clinical  observations, patient history, and epidemiological information. If result is POSITIVE SARS-CoV-2 target nucleic acids are DETECTED. The SARS-CoV-2 RNA is generally detectable in upper and lower  respiratory specimens dur ing the acute phase of infection.  Positive  results are indicative of active infection with SARS-CoV-2.  Clinical  correlation with patient history and other diagnostic information is  necessary to determine patient infection status.  Positive results do  not rule out bacterial infection or co-infection with other viruses. If result is PRESUMPTIVE POSTIVE SARS-CoV-2 nucleic acids MAY BE PRESENT.   A presumptive positive result was obtained on the submitted specimen  and confirmed on repeat testing.  While 2019 novel coronavirus  (SARS-CoV-2) nucleic acids may be present in the submitted sample  additional confirmatory testing may be necessary for epidemiological  and / or clinical management purposes  to differentiate between  SARS-CoV-2 and other Sarbecovirus currently known to infect humans.  If clinically indicated additional testing with an alternate test  methodology [phone]) is advised. The SARS-CoV-2 RNA is generally  detectable in upper and lower respiratory sp ecimens during the acute  phase of infection. The expected result is Negative. Fact Sheet for Patients:  StrictlyIdeas.no Fact Sheet for Healthcare Providers: BankingDealers.co.za This test is not yet approved or cleared by the [place] FDA and has been authorized for detection and/or diagnosis of SARS-CoV-2 by FDA under an Emergency Use Authorization (EUA).  This EUA will remain in effect (meaning this test can be used) for the duration of the COVID-19 declaration  under Section 564(b)(1) of the Act, 21 U.S.C. section 360bbb-3(b)(1), unless the authorization is terminated or revoked sooner. Performed at [facility], [place] [address], [place], [place] [address]   MRSA PCR Screening     Status: None   Collection Time: 09/30/18  4:01 AM   Specimen: Nasal Mucosa; Nasopharyngeal  Result Value Ref Range Status   MRSA by PCR NEGATIVE NEGATIVE Final    Comment:        The GeneXpert MRSA Assay (FDA approved for NASAL specimens only), is one component of a comprehensive MRSA colonization surveillance program. It is not intended to diagnose MRSA infection nor to guide or monitor treatment for MRSA infections. Performed at [facility], [place] [address] [place], [place] [address]   Surgical pcr screen     Status: None   Collection Time: 10/02/18  7:31 AM   Specimen: Nasal Mucosa; Nasal Swab  Result Value Ref Range Status   MRSA, PCR NEGATIVE NEGATIVE Final   Staphylococcus aureus NEGATIVE NEGATIVE Final    Comment: (NOTE) The Xpert SA Assay (FDA approved for NASAL specimens in patients 20 years of age and older), is one component of a comprehensive surveillance program. It is not intended to diagnose infection nor to guide or monitor treatment. Performed at [facility], [place] [address], [place], [place] [address]   Culture, blood (routine x 2)     Status: None  Collection Time: 10/02/18  7:19 PM   Specimen: BLOOD LEFT HAND  Result Value Ref Range Status   Specimen Description BLOOD LEFT HAND  Final   Special Requests   Final    BOTTLES DRAWN AEROBIC AND ANAEROBIC Blood Culture results may not be optimal due to an inadequate volume of blood received in culture bottles   Culture   Final    NO GROWTH 5 DAYS Performed at [facility], [place] [address] [place], [place] [address]    Report Status 10/07/2018 FINAL  Final  Culture, blood (routine x 2)     Status: None   Collection Time: 10/02/18   9:02 PM   Specimen: BLOOD LEFT HAND  Result Value Ref Range Status   Specimen Description BLOOD LEFT HAND  Final   Special Requests   Final    BOTTLES DRAWN AEROBIC AND ANAEROBIC Blood Culture adequate volume   Culture   Final    NO GROWTH 5 DAYS Performed at [facility], [place] [address], [place], [place] [address]    Report Status 10/07/2018 FINAL  Final         Radiology Studies: No results found.      Scheduled Meds:  amLODipine  10 mg Oral Daily   atorvastatin  40 mg Oral QHS   carvedilol  12.5 mg Oral BID   Chlorhexidine Gluconate Cloth  6 each Topical Daily   DULoxetine  30 mg Oral Daily   ferrous sulfate  325 mg Oral Q breakfast   gabapentin  300 mg Oral TID   insulin aspart  0-15 Units Subcutaneous TID WC   insulin NPH Human  10 Units Subcutaneous BID AC & HS   morphine  15 mg Oral Q12H   nicotine  21 mg Transdermal Daily   polyethylene glycol  17 g Oral Daily   senna-docusate  2 tablet Oral BID   Continuous Infusions:  sodium chloride     heparin 2,300 Units/hr (10/07/18 2241)   sodium bicarbonate 150 mEq in dextrose 5% 1000 mL 150 mEq (10/08/18 0732)   vancomycin 1,250 mg (10/06/18 1006)     LOS: 8 days   The patient is critically ill with multiple organ systems failure and requires high complexity decision making for assessment and support, frequent evaluation and titration of therapies, application of advanced monitoring technologies and extensive interpretation of multiple databases. Critical Care Time devoted to patient care services described in this note  Time spent: 40 minutes     Belynda Pagaduan, Geraldo Docker, MD [facility] Pager [phone]  If 7PM-7AM, please contact night-coverage [email or web address] Password [facility] 10/08/2018, 8:17 AM

## 2018-10-08 NOTE — Progress Notes (Signed)
OT Cancellation Note  Patient Details Name: Daniel Moon MRN: [id number] DOB: [birth year]   Cancelled Treatment:     Reason Eval/Treat Not Completed: Pt declining OT session this AM d/t too much pain in LLE. Will follow-up for OT treatment as schedule permits    Aileen Pilot, [facility] 10/08/2018, 8:43 AM

## 2018-10-08 NOTE — Progress Notes (Signed)
ANTICOAGULATION CONSULT NOTE - Follow Up Consult  Pharmacy Consult for Heparin Indication: LV thrombus  Allergies  Allergen Reactions  . Amoxicillin-Pot Clavulanate Nausea And Vomiting    Did it involve swelling of the face/tongue/throat, SOB, or low BP? No Did it involve sudden or severe rash/hives, skin peeling, or any reaction on the inside of your mouth or nose? No Did you need to seek medical attention at a hospital or doctor's office? No When did it last happen?6 months ago If all above answers are "NO", may proceed with cephalosporin use.    Patient Measurements: Height: 6\' 3"  (190.5 cm) Weight: 181 lb (82.1 kg) IBW/kg (Calculated) : 84.5 Heparin Dosing Weight: 82 kg  Vital Signs: Temp: 97.9 F (36.6 C) (09/21 0642) Temp Source: Oral (09/21 0642) BP: 141/95 (09/21 [id number]) Pulse Rate: 85 (09/21 0642)  Labs: Recent Labs    10/06/18 0925 10/07/18 0359 10/08/18 0228  HGB 8.2* 8.3* 8.6*  HCT 26.8* 27.0* 27.7*  PLT 417* 454* 419*  HEPARINUNFRC 0.39 0.32 0.41  CREATININE 1.40* 1.40* 1.27*    Estimated Creatinine Clearance: 74.5 mL/min (A) (by C-G formula based on SCr of 1.27 mg/dL (H)).  Assessment:   58 yr old male started on IV heparin for LV thrombus since 9/14 pm.  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     Heparin level remains therapeutic (0.41) on 2300 units/hr.    Goal of Therapy:  Heparin level 0.3-0.7 units/ml Monitor platelets by anticoagulation protocol: Yes   Plan:   Continue Heparin drip at 2300 units/hr.  Daily heparin level and CBC.  Follow up oral anticoagulation plan.  PTA Plavix stopped on 9/14.  Arty Baumgartner, [place] Pager: [phone] or phone: [phone] 10/08/2018,1:10 PM

## 2018-10-08 NOTE — Progress Notes (Signed)
Inpatient Rehab Admissions Coordinator:   Note therapy recommending HHPT with pt refusing CIR/SNF placement.  Also note poor participation with therapy in acute setting. Will sign off at this time.   Shann Medal, PT, DPT Admissions Coordinator [phone] 10/08/18  3:58 PM

## 2018-10-09 LAB — BASIC METABOLIC PANEL
Anion gap: 7 (ref 5–15)
BUN: 10 mg/dL (ref 6–20)
CO2: 23 mmol/L (ref 22–32)
Calcium: 8.4 mg/dL — ABNORMAL LOW (ref 8.9–10.3)
Chloride: 103 mmol/L (ref 98–111)
Creatinine, Ser: 1.23 mg/dL (ref 0.61–1.24)
GFR calc Af Amer: >60 mL/min (ref >60)
GFR calc non Af Amer: >60 mL/min (ref >60)
Glucose, Bld: 109 mg/dL — ABNORMAL HIGH (ref 70–99)
Potassium: 4.2 mmol/L (ref 3.5–5.1)
Sodium: 133 mmol/L — ABNORMAL LOW (ref 135–145)

## 2018-10-09 LAB — CBC
HCT: 31.7 % — ABNORMAL LOW (ref 39.0–52.0)
Hemoglobin: 9.9 g/dL — ABNORMAL LOW (ref 13.0–17.0)
MCH: 22.8 pg — ABNORMAL LOW (ref 26.0–34.0)
MCHC: 31.2 g/dL (ref 30.0–36.0)
MCV: 72.9 fL — ABNORMAL LOW (ref 80.0–100.0)
Platelets: 474 10*3/uL — ABNORMAL HIGH (ref 150–400)
RBC: 4.35 MIL/uL (ref 4.22–5.81)
RDW: 24.4 % — ABNORMAL HIGH (ref 11.5–15.5)
WBC: 9.7 10*3/uL (ref 4.0–10.5)
nRBC: 0 % (ref 0.0–0.2)

## 2018-10-09 LAB — GLUCOSE, CAPILLARY
Glucose-Capillary: 104 mg/dL — ABNORMAL HIGH (ref 70–99)
Glucose-Capillary: 106 mg/dL — ABNORMAL HIGH (ref 70–99)
Glucose-Capillary: 127 mg/dL — ABNORMAL HIGH (ref 70–99)
Glucose-Capillary: 127 mg/dL — ABNORMAL HIGH (ref 70–99)

## 2018-10-09 LAB — MAGNESIUM: Magnesium: 1.7 mg/dL (ref 1.7–2.4)

## 2018-10-09 LAB — HEPARIN LEVEL (UNFRACTIONATED): Heparin Unfractionated: 0.57 IU/mL (ref 0.30–0.70)

## 2018-10-09 MED ORDER — LINEZOLID 600 MG PO TABS
600.0000 mg | ORAL_TABLET | Freq: Two times a day (BID) | ORAL | Status: DC
  Administered 2018-10-09 – 2018-10-14 (×11): 600 mg via ORAL
  Filled 2018-10-09 (×12): qty 1

## 2018-10-09 MED ORDER — HEPARIN (PORCINE) 25000 UT/250ML-% IV SOLN
2300.0000 [IU]/h | INTRAVENOUS | Status: AC
  Filled 2018-10-09: qty 250

## 2018-10-09 MED ORDER — RIVAROXABAN 15 MG PO TABS
15.0000 mg | ORAL_TABLET | Freq: Two times a day (BID) | ORAL | Status: DC
  Administered 2018-10-09 – 2018-10-14 (×10): 15 mg via ORAL
  Filled 2018-10-09 (×10): qty 1

## 2018-10-09 NOTE — Progress Notes (Signed)
Occupational Therapy Treatment Patient Details Name: Daniel Moon MRN: [id number] DOB: Jun 23, 1960 Today's Date: 10/09/2018    History of present illness Pt is a 58 y.o. male admitted 09/30/18 with nonhealing L transmet amputation wound; worked up for sepsis likely due to osteomyelitis. Echo 9/14 with LV thrombus; heparin level therapeutic range as of 9/18. 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 PMH includes CHF, HTN, Hep-C, DM, PVD.   OT comments  Pt making steady progress towards OT goals. Pt MOD I for bed mobility; MIN guard for sit>stand with RW from EOB. Pt min guard - supervision for functional mobility to>from bathroom. Pt completed seated grooming tasks at sink with supervision for safety. MOD A to sit>stand from low surface chair. Pt much more participatory in therapy this session. DC plan remains appropriate. Will continue to follow for acute OT needs.   Follow Up Recommendations  Home health OT;Supervision/Assistance - 24 hour    Equipment Recommendations  3 in 1 bedside commode;Tub/shower bench;Wheelchair (measurements OT);Wheelchair cushion (measurements OT)    Recommendations for Other Services      Precautions / Restrictions Precautions Precautions: Fall Restrictions Weight Bearing Restrictions: Yes LLE Weight Bearing: Non weight bearing       Mobility Bed Mobility Overal bed mobility: Modified Independent             General bed mobility comments: Mod indep with HOB elevated  Transfers Overall transfer level: Needs assistance Equipment used: Rolling walker (2 wheeled) Transfers: Sit to/from Stand Sit to Stand: Min guard;Supervision;Mod assist         General transfer comment: supervision- minguard for sit>stand from elevated EOB; MOD A from low surface with RW    Balance Overall balance assessment: Needs assistance Sitting-balance support: No upper extremity supported;Feet supported Sitting balance-[name]: Good Sitting balance - Comments: able to sit at sink and  complete ADL tasks with no UE support     Standing balance-[name]: Poor Standing balance comment: Reliant on UE support                           ADL either performed or assessed with clinical judgement   ADL Overall ADL's : Needs assistance/impaired     Grooming: Sitting;Oral care;Wash/dry face;Wash/dry hands;Set up;Supervision/safety                   Toilet Transfer: Minimal assistance;Ambulation;RW;Min guard [employer] forces technical officer Details (indicate cue type and reason): simualated toilet transfer from EOB>chair; MIN Guard for sit>stand from EOB; MIN A for transfer           General ADL Comments: pt supervision for seated grooming tasks; [place]  for functional mobility with RW     Vision Baseline Vision/History: Wears glasses Wears Glasses: Reading only Patient Visual Report: No change from baseline     Perception     Praxis      Cognition Arousal/Alertness: Awake/alert Behavior During Therapy: WFL for tasks assessed/performed;Flat affect Overall Cognitive Status: Within Functional Limits for tasks assessed                                 General Comments: WFL for functional tasks; flat overall but much more agreeable to participate        Exercises     Shoulder Instructions       General Comments      Pertinent Vitals/ Pain       Pain Assessment:  0-10 Pain Score: 8  Pain Location: L residual limb Pain Descriptors / Indicators: Operative site guarding;Grimacing;Discomfort Pain Intervention(s): Repositioned;Monitored during session  Home Living                                          Prior Functioning/Environment              Frequency  Min 2X/week        Progress Toward Goals  OT Goals(current goals can now be found in the care plan section)  Progress towards OT goals: Progressing toward goals  Acute Rehab OT Goals Patient Stated Goal: go home OT Goal Formulation: With patient Time  For Goal Achievement: 10/19/18 Potential to Achieve Goals: Good  Plan      Co-evaluation                 AM-PAC OT "6 Clicks" Daily Activity     Outcome Measure   Help from another person eating meals?: None Help from another person taking care of personal grooming?: None Help from another person toileting, which includes using toliet, bedpan, or urinal?: A Lot Help from another person bathing (including washing, rinsing, drying)?: A Lot Help from another person to put on and taking off regular upper body clothing?: A Little Help from another person to put on and taking off regular lower body clothing?: Total 6 Click Score: 16    End of Session Equipment Utilized During Treatment: Gait belt;Rolling walker  OT Visit Diagnosis: Pain   Activity Tolerance Patient tolerated treatment well   Patient Left in chair;with call bell/phone within reach   Nurse Communication Mobility status        Time: [id number] OT Time Calculation (min): 10 min  Charges: OT General Charges $OT Visit: 1 Visit OT Treatments $Self Care/Home Management : 8-22 mins  [place], [place] Acute Rehabilitation Services [phone]    Ihor Gully 10/09/2018, 2:25 PM

## 2018-10-09 NOTE — Progress Notes (Signed)
Physical Therapy Treatment Patient Details Name: Daniel Moon MRN: [id number] DOB: 1960-10-23 Today's Date: 10/09/2018    History of Present Illness Pt is a 58 y.o. male admitted 09/30/18 with nonhealing L transmet amputation wound; worked up for sepsis likely due to osteomyelitis. Echo 9/14 with LV thrombus; heparin level therapeutic range as of 9/18. 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 PMH includes CHF, HTN, Hep-C, DM, PVD.    PT Comments    Pt progressing well and more recepeptive to participation,  He continues to be limited due to pain.  From a low surface he is requiring moderate assistance.   Encouraged knee and hip extension and educated on prone lying at home.  Pt resting in recline on RA. SPO2 greater that 97%.    Follow Up Recommendations  Home health PT;Supervision/Assistance - 24 hour(admantly refusing cir/snf)     Equipment Recommendations  Rolling walker with 5" wheels;3in1 (PT);Wheelchair (measurements PT);Wheelchair cushion (measurements PT)    Recommendations for Other Services       Precautions / Restrictions Precautions Precautions: Fall Restrictions Weight Bearing Restrictions: Yes LLE Weight Bearing: Non weight bearing    Mobility  Bed Mobility Overal bed mobility: Modified Independent             General bed mobility comments: Mod indep with HOB elevated  Transfers Overall transfer level: Needs assistance Equipment used: Rolling walker (2 wheeled) Transfers: Sit to/from Stand Sit to Stand: Min guard;Supervision;Mod assist         General transfer comment: supervision- minguard for sit>stand from elevated EOB; MOD A from low surface with RW  Ambulation/Gait Ambulation/Gait assistance: Min guard Gait Distance (Feet): 10 Feet(+ 15 ft) Assistive device: Rolling walker (2 wheeled) Gait Pattern/deviations: Step-to pattern;Trunk flexed(hop to) Gait velocity: Decreased   General Gait Details: Cues for safety with RW and upper trunk control.   Stairs             Wheelchair Mobility    Modified Rankin (Stroke Patients Only)       Balance Overall balance assessment: Needs assistance Sitting-balance support: No upper extremity supported;Feet supported Sitting balance-[name]: Good Sitting balance - Comments: able to sit at sink and complete ADL tasks with no UE support     Standing balance-[name]: Poor Standing balance comment: Reliant on UE support                            Cognition Arousal/Alertness: Awake/alert Behavior During Therapy: WFL for tasks assessed/performed;Flat affect Overall Cognitive Status: Within Functional Limits for tasks assessed                                 General Comments: WFL for functional tasks; flat overall but much more agreeable to participate      Exercises Amputee Exercises Quad Sets: AROM;Left;10 reps;Seated Hip Extension: AROM;Left;10 reps;Standing    General Comments        Pertinent Vitals/Pain Pain Assessment: 0-10 Pain Score: 8  Pain Location: L residual limb Pain Descriptors / Indicators: Operative site guarding;Grimacing;Discomfort Pain Intervention(s): Monitored during session    Home Living                      Prior Function            PT Goals (current goals can now be found in the care plan section) Acute Rehab PT Goals Patient Stated Goal:  go home Potential to Achieve Goals: Good Progress towards PT goals: Progressing toward goals    Frequency    Min 3X/week      PT Plan Current plan remains appropriate    Co-evaluation              AM-PAC PT "6 Clicks" Mobility   Outcome Measure  Help needed turning from your back to your side while in a flat bed without using bedrails?: None Help needed moving from lying on your back to sitting on the side of a flat bed without using bedrails?: None Help needed moving to and from a bed to a chair (including a wheelchair)?: A Little Help needed standing up  from a chair using your arms (e.g., wheelchair or bedside chair)?: A Little Help needed to walk in hospital room?: A Little Help needed climbing 3-5 steps with a railing? : Total 6 Click Score: 18    End of Session Equipment Utilized During Treatment: Gait belt Activity Tolerance: Patient limited by pain Patient left: in chair;with call bell/phone within reach Nurse Communication: Mobility status PT Visit Diagnosis: Other abnormalities of gait and mobility (R26.89);Pain Pain - Right/Left: Left Pain - part of body: Leg     Time: [id number] PT Time Calculation (min) (ACUTE ONLY): 8 min  Charges:  $Gait Training: 8-22 mins                     Governor Rooks, PTA Acute Rehabilitation Services Pager [phone] Office [phone]     Robynne Roat Eli Hose 10/09/2018, 6:04 PM

## 2018-10-09 NOTE — TOC Progression Note (Signed)
Transition of Care [facility]) - Progression Note    Patient Details  Name: Kodie Angst MRN: [id number] Date of Birth: 1960/06/22  Transition of Care [facility]) CM/SW Contact  Jacalyn Lefevre Edson Snowball, RN Phone Number: 10/09/2018, 1:41 PM  Clinical Narrative:     Patient's insurance is medicaid , which does not have a LTAC benefit.   Patient currently refusing PT and per PA note does not want dressing on amputation.    Even if patient able to discharge to home on PO ABX will not be able to arrange home health due to above.   Will continue to follow.  Expected Discharge Plan: [place] Barriers to Discharge: Continued Medical Work up  Expected Discharge Plan and Services Expected Discharge Plan: [name] Choice: [place] arrangements for the past 2 months: Single Family Home                                       Social Determinants of Health (SDOH) Interventions    Readmission Risk Interventions No flowsheet data found.

## 2018-10-09 NOTE — Progress Notes (Addendum)
  Progress Note    10/09/2018 8:51 AM 4 Days Post-Op  Subjective:  Some pain in stump overnight but none currently   Vitals:   10/08/18 2103 10/09/18 0429  BP: (!) 140/95 (!) 147/102  Pulse: 82 87  Resp: 18 (!) 22  Temp: 97.8 F (36.6 C) (!) 97.5 F (36.4 C)  SpO2: 97% 98%    Physical Exam: Incisions:  L BKA healing well; no drainage; skin edges viable; no areas of fluctuance   CBC    Component Value Date/Time   WBC 9.7 10/09/2018 0512   RBC 4.35 10/09/2018 0512   HGB 9.9 (L) 10/09/2018 0512   HCT 31.7 (L) 10/09/2018 0512   PLT 474 (H) 10/09/2018 0512   MCV 72.9 (L) 10/09/2018 0512   MCH 22.8 (L) 10/09/2018 0512   MCHC 31.2 10/09/2018 0512   RDW 24.4 (H) 10/09/2018 0512   LYMPHSABS 2.5 10/01/2018 0236   MONOABS 1.0 10/01/2018 0236   EOSABS 0.3 10/01/2018 0236   BASOSABS 0.1 10/01/2018 0236    BMET    Component Value Date/Time   NA 133 (L) 10/09/2018 0512   K 4.2 10/09/2018 0512   CL 103 10/09/2018 0512   CO2 23 10/09/2018 0512   GLUCOSE 109 (H) 10/09/2018 0512   BUN 10 10/09/2018 0512   CREATININE 1.23 10/09/2018 0512   CALCIUM 8.4 (L) 10/09/2018 0512   GFRNONAA >60 10/09/2018 0512   GFRAA >60 10/09/2018 0512    INR    Component Value Date/Time   INR 1.4 (H) 09/30/2018 0054     Intake/Output Summary (Last 24 hours) at 10/09/2018 0851 Last data filed at 10/09/2018 0738 Gross per 24 hour  Intake 120 ml  Output 3601 ml  Net -3481 ml     Assessment/Plan:  58 y.o. male is s/p left below knee amputation  4 Days Post-Op  - Patient prefers L BKA stump open to air; encouraged stump sock or dressing to protect - transition from heparin to [place] per primary team - Dispo: refusing CIR; plan is for HHPT - Office will arrange follow up in 4 weeks for staple removal   Dagoberto Ligas, PA-C [facility] [phone] 10/09/2018 8:51 AM  I have interviewed the patient and examined the patient. I agree with the findings by the PA. We  will follow peripherally.   Gae Gallop, MD [phone]

## 2018-10-09 NOTE — Discharge Instructions (Addendum)
Information on my medicine - XARELTO (rivaroxaban)  This medication education was reviewed with me or my healthcare representative as part of my discharge preparation.   WHY WAS XARELTO PRESCRIBED FOR YOU? Xarelto was prescribed to treat blood clots that may have been found in a chamber of your heart (left ventricular thrombus) and to reduce the risk of them occurring again.  What do you need to know about Xarelto? The starting dose is one 15 mg tablet taken TWICE daily with food for the FIRST 21 DAYS then on (enter date)  10/31/18  the dose is changed to one 20 mg tablet taken ONCE A DAY with your evening meal.  DO NOT stop taking Xarelto without talking to the health care provider who prescribed the medication.  Refill your prescription for 20 mg tablets before you run out.  After discharge, you should have regular check-up appointments with your healthcare provider that is prescribing your Xarelto.  In the future your dose may need to be changed if your kidney function changes by a significant amount.  What do you do if you miss a dose? If you are taking Xarelto TWICE DAILY and you miss a dose, take it as soon as you remember. You may take two 15 mg tablets (total 30 mg) at the same time then resume your regularly scheduled 15 mg twice daily the next day.  If you are taking Xarelto ONCE DAILY and you miss a dose, take it as soon as you remember on the same day then continue your regularly scheduled once daily regimen the next day. Do not take two doses of Xarelto at the same time.   Important Safety Information Xarelto is a blood thinner medicine that can cause bleeding. You should call your healthcare provider right away if you experience any of the following: ? Bleeding from an injury or your nose that does not stop. ? Unusual colored urine (red or dark brown) or unusual colored stools (red or black). ? Unusual bruising for unknown reasons. ? A serious fall or if you hit your  head (even if there is no bleeding).  Some medicines may interact with Xarelto and might increase your risk of bleeding while on Xarelto. To help avoid this, consult your healthcare provider or pharmacist prior to using any new prescription or non-prescription medications, including herbals, vitamins, non-steroidal anti-inflammatory drugs (NSAIDs) and supplements.  This website has more information on Xarelto: [email or web address]

## 2018-10-09 NOTE — Progress Notes (Signed)
PROGRESS NOTE    Daniel Moon  [id number] DOB: 18-Apr-1960 DOA: [id number] PCP: Sanjuana Letters, MD   Brief Narrative:  Daniel Moon is a 58 y.o. BM PMHx DM type II uncontrolled with complication, DM peripheral neuropathy, nonhealing left transmetatarsal amputation wound and limb threatening ischemia   Admitted with sepsis likely due to osteomyelitis. The patient is a poor historian but tells me he started having fevers today at home. He says he was feeling fine until today when he says his family heard him wake up yelling in his sleep. He was found to have a fever and his family called EMS. He last had balloon angioplasty in July and says he was given antibiotics after that procedure but didn't take them since they made him feel sick. Since that time, he has not seen a physician.   ED Course: He was found to be febrile, have a leukocytosis and XR of foot shows evidence of osteomyelitis. He was given IVF but gently due to his history of CHF, and his blood pressure is stable. He was given empiric vancomycin and zosyn.   Subjective: 9/22 A/O x4, negative CP, negative abdominal pain, negative LEFT BKA pain.  Participating with PT and performing ADLs     Assessment & Plan:   Principal Problem:   MRSA bacteremia Active Problems:   HTN (hypertension)   Diabetes ([name])   HLD (hyperlipidemia)   COPD (chronic obstructive pulmonary disease) (HCC)   Sepsis ([name])   Open wound of left foot   Osteomyelitis (HCC)   Microcytic anemia   PAD (peripheral artery disease) (HCC)   Peripheral neuropathy   Cigarette smoker   S/P transmetatarsal amputation of foot, left (HCC)   Pressure injury of skin   Chronic renal insufficiency, stage 3 (moderate) (HCC)   Paroxysmal atrial fibrillation (HCC)   Unintentional weight loss   Poor dentition   Drug-seeking behavior  Sepsis secondary to LEFT lower extremity osteomyelitis/MRSA bacteremia -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 Martyn Ehrich, ID who stated once we have final negative blood cultures can start patient on 2 weeks of linezolid.  Will start today.  Pain control/DRUG SEEKING BEHAVIOR - Despite increasing amounts of medication patient continues to demand methadone. - Patient unable to stay awake long enough to take p.o. medication yet states in terrible pain.  Sleeps throughout the day refusing to participate PT/OT. - Upon observation patient will either be lying in bed comfortably or sleep. - Unable to describe what his pain feels like and again demands methadone or increasing amounts of morphine.  LEFT ventricular thrombus insetting proximal atrial fibrillation/medication noncompliance - Continue heparin drip - 9/22 discussed case with Dr. Glori Bickers cardiology given patient's substance abuse history and lack of compliance recommended Xarelto.   - 9/22 DC heparin start Xarelto per pharmacy  Acute on CKD stage II (baseline Cr 1.4) Recent Labs  Lab 10/05/18 0323 10/06/18 0925 10/07/18 0359 10/08/18 0228 10/09/18 0512  CREATININE 1.55* 1.40* 1.40* 1.27* 1.23  - At baseline  Hyperkalemia -Resolved  Metabolic acidosis -[id number] sodium bicarb 50 ml/hr -Improving continue drip  Iron deficiency anemia/anemia of chronic disease -Presented hemoglobin 6.9  - Appears multiple transfusions -Iron studies done on 09/30/2018 significant for iron deficiency -Hold off IV iron supplement in the setting of active infective process -Start p.o. ferrous sulfate 325  daily Recent Labs  Lab 10/05/18 0323 10/06/18 0925 10/07/18 0359 10/08/18 0228 10/09/18 0512  HGB 8.2* 8.2* 8.3* 8.6* 9.9*   Peripheral vascular disease -ABI ordered and pending -Hold off Plavix given need for full dose anticoagulation, could consider 81 aspirin  at discharge  Chronic systolic CHF -EF 20 to [id number] -Strict in and out +4.5 L -Daily weight Filed Weights   10/01/18 1930 10/05/18 1118 10/06/18 2201  Weight: 81.9 kg 81.9 kg 82.1 kg  -[facility] at [facility] -TTE remarkable for 2.6 x 2 cm LV thrombus  Diabetes type 2 uncontrolled with complication -[id number] hemoglobin A1c= 8.4 - Moderate SSI  HLD - 9/19 LDL 71 -Continue Lipitor 40 mg daily  Noncompliance medication - We will need to counsel patient extensively concerning his noncompliance with medications prior to discharge. -Noncompliance with PT/OT, drug-seeking behavior.  Patient poor candidate for rehab.  Belligerence toward staff - Counseled patient at length with his wife present that is belligerent and cursing at staff members would not be tolerated.  If it continues to occur patient will be discharged. -9/21 Haldol 2 mg QID.  Mood stabilization.  Patient extremely belligerent towards staff, doubt will make any significant progress toward placement (CIR vs SNF) since he refuses to participate.  Patient wants to be totally knocked out all day long.   Goals of Care  -9/20 Consult to PT/OT placed;  eval for CIR vs SNF -9/21 consult to NCM; patient noncompliant with PT/OT, patient drug-seeking.  Patient wants enough narcotics to keep him sleep throughout the day.  Poor candidate for rehab begin search for LTAC    DVT prophylaxis: Heparin drip--> Xarelto Code Status: Full Family Communication: 9/20  None  Disposition Plan: TBD     Consultants:  Vascular surgery ID Cardiology    Procedures/Significant Events:  9/14 echocardiogram;Left Ventricle: EF = 20-25%.  -Doppler parameters are consistent with restrictive filling. -Left Atrium moderately dilated. -Mitral Valve: regurgitation is moderate to severe  -Tricuspid Valve: regurgitation is moderate-severe . 9/15 s/p LEFT BKA 9/18 TEE;Left Ventricle: EF =30 to 35%. T -Large, fixed, apical left ventricular thrombus. The  thrombus appears spherical in shape and solid in texture. The thrombus measures approximately 30 mm by 25 mm. -Right Ventricle: moderately enlarged. No increase in right ventricular wall thickness. Global RV systolic function is has normal systolic function. The tricuspid regurgitant velocity is 4.18 m/s, and with an assumed right  atrial pressure of 10 mmHg, the estimated right ventricular systolic pressure is severely elevated at 79.9 mmHg. -Left/RIGHT Atrium:  moderately dilated. -Pericardium: There is no evidence of pericardial effusion. There is a moderate pleural effusion in either the left or right lateral region. -Tricuspid Valve: regurgitation severe -Additional Comments: No endocarditis, no vegetations.     I have personally reviewed and interpreted all radiology studies and my findings are as above.  VENTILATOR SETTINGS:    Cultures 9/13 blood LEFT antecubital positive MRSA 9/13 blood RIGHT forearm positive MRSA 9/13 MRSA by PCR negative 9/15 blood LEFT hand negative final x2     Antimicrobials: Anti-infectives (From admission, onward)   Start     Stop   10/06/18 1000  vancomycin (VANCOCIN) 1,250 mg in sodium chloride 0.9 % 250 mL IVPB         10/05/18 0000  linezolid (ZYVOX) 600 MG tablet     10/19/18 2359   10/02/18 1530  piperacillin-tazobactam (ZOSYN) IVPB 3.375 g  Status:  Discontinued     10/02/18 1520   10/02/18 1530  piperacillin-tazobactam (ZOSYN)  IVPB 3.375 g     10/02/18 1535   10/01/18 2200  vancomycin (VANCOCIN) 1,250 mg in sodium chloride 0.9 % 250 mL IVPB  Status:  Discontinued     10/05/18 0000   09/30/18 2200  vancomycin (VANCOCIN) 1,750 mg in sodium chloride 0.9 % 500 mL IVPB  Status:  Discontinued     10/01/18 1035   09/30/18 0600  piperacillin-tazobactam (ZOSYN) IVPB 3.375 g  Status:  Discontinued     10/02/18 1012   09/30/18 0145  piperacillin-tazobactam (ZOSYN) IVPB 3.375 g     09/30/18 0203   09/30/18 0145  vancomycin (VANCOCIN) 1,750 mg  in sodium chloride 0.9 % 500 mL IVPB     09/30/18 0206       Devices    LINES / TUBES:      Continuous Infusions: . sodium chloride    . heparin 2,300 Units/hr (10/09/18 1123)  . sodium bicarbonate 150 mEq in dextrose 5% 1000 mL 150 mEq (10/09/18 0446)  . vancomycin 1,250 mg (10/08/18 0951)     Objective: Vitals:   10/08/18 0642 10/08/18 1805 10/08/18 2103 10/09/18 0429  BP: (!) 141/95 (!) 134/95 (!) 140/95 (!) 147/102  Pulse: 85 75 82 87  Resp: 18 20 18  (!) 22  Temp: 97.9 F (36.6 C) 97.6 F (36.4 C) 97.8 F (36.6 C) (!) 97.5 F (36.4 C)  TempSrc: Oral Oral Oral Oral  SpO2: 94% 96% 97% 98%  Weight:      Height:        Intake/Output Summary (Last 24 hours) at 10/09/2018 1333 Last data filed at 10/09/2018 1328 Gross per 24 hour  Intake 0 ml  Output 3450 ml  Net -3450 ml   Filed Weights   10/01/18 1930 10/05/18 1118 10/06/18 2201  Weight: 81.9 kg 81.9 kg 82.1 kg   Physical Exam:  General: A/O x4, no acute respiratory distress Eyes: negative scleral hemorrhage, negative anisocoria, negative icterus ENT: Negative Runny nose, negative gingival bleeding, Neck:  Negative scars, masses, torticollis, lymphadenopathy, JVD Lungs: Clear to auscultation bilaterally without wheezes or crackles Cardiovascular: Regular rate and rhythm without murmur gallop or rub normal S1 and S2 Abdomen: negative abdominal pain, nondistended, positive soft, bowel sounds, no rebound, no ascites, no appreciable mass Extremities: LEFT BKA covered and clean negative sign of infection did not remove dressing Skin: Negative rashes, lesions, ulcers Psychiatric:  Negative depression, negative anxiety, negative fatigue, negative mania  Central nervous system:  Cranial nerves II through XII intact, tongue/uvula midline, all extremities muscle strength 5/5,  negative dysarthria, negative expressive aphasia, negative receptive aphasia.     Data Reviewed: Care during the described time interval  was provided by me .  I have reviewed this patient's available data, including medical history, events of note, physical examination, and all test results as part of my evaluation.   CBC: Recent Labs  Lab 10/05/18 0323 10/06/18 0925 10/07/18 0359 10/08/18 0228 10/09/18 0512  WBC 11.4* 11.3* 10.8* 9.5 9.7  HGB 8.2* 8.2* 8.3* 8.6* 9.9*  HCT 24.9* 26.8* 27.0* 27.7* 31.7*  MCV 70.7* 74.0* 73.8* 73.5* 72.9*  PLT 272 417* 454* 419* 123XX123*   Basic Metabolic Panel: Recent Labs  Lab 10/05/18 0323 10/06/18 0925 10/07/18 0359 10/08/18 0228 10/09/18 0512  NA 135 135 136 134* 133*  K 4.5 4.2 4.2 3.9 4.2  CL 110 106 104 103 103  CO2 17* 19* 23 21* 23  GLUCOSE 76 134* 120* 133* 109*  BUN 18 15 14 11 10   CREATININE  1.55* 1.40* 1.40* 1.27* 1.23  CALCIUM 7.7* 8.0* 8.0* 8.1* 8.4*  MG 1.8 1.8 1.8 1.7 1.7  PHOS  --  3.2  --   --   --    GFR: Estimated Creatinine Clearance: 76.9 mL/min (by C-G formula based on SCr of 1.23 mg/dL). Liver Function Tests: No results for input(s): AST, ALT, ALKPHOS, BILITOT, PROT, ALBUMIN in the last 168 hours. No results for input(s): LIPASE, AMYLASE in the last 168 hours. No results for input(s): AMMONIA in the last 168 hours. Coagulation Profile: No results for input(s): INR, PROTIME in the last 168 hours. Cardiac Enzymes: No results for input(s): CKTOTAL, CKMB, CKMBINDEX, TROPONINI in the last 168 hours. BNP (last 3 results) No results for input(s): PROBNP in the last 8760 hours. HbA1C: No results for input(s): HGBA1C in the last 72 hours. CBG: Recent Labs  Lab 10/08/18 1238 10/08/18 1802 10/08/18 2059 10/09/18 0818 10/09/18 1227  GLUCAP 118* 117* 115* 104* 106*   Lipid Profile: No results for input(s): CHOL, HDL, LDLCALC, TRIG, CHOLHDL, LDLDIRECT in the last 72 hours. Thyroid Function Tests: No results for input(s): TSH, T4TOTAL, FREET4, T3FREE, THYROIDAB in the last 72 hours. Anemia Panel: No results for input(s): VITAMINB12, FOLATE,  FERRITIN, TIBC, IRON, RETICCTPCT in the last 72 hours. Urine analysis:    Component Value Date/Time   COLORURINE AMBER (A) 09/30/2018 0051   APPEARANCEUR CLOUDY (A) 09/30/2018 0051   LABSPEC 1.024 09/30/2018 0051   PHURINE 5.0 09/30/2018 0051   GLUCOSEU NEGATIVE 09/30/2018 0051   HGBUR NEGATIVE 09/30/2018 0051   BILIRUBINUR NEGATIVE 09/30/2018 0051   KETONESUR NEGATIVE 09/30/2018 0051   PROTEINUR 100 (A) 09/30/2018 0051   NITRITE NEGATIVE 09/30/2018 0051   LEUKOCYTESUR NEGATIVE 09/30/2018 0051   Sepsis Labs: @LABRCNTIP (procalcitonin:4,lacticidven:4)  ) Recent Results (from the past 240 hour(s))  Culture, blood (Routine x 2)     Status: Abnormal   Collection Time: 09/30/18 12:54 AM   Specimen: BLOOD  Result Value Ref Range Status   Specimen Description   Final    BLOOD LEFT ANTECUBITAL Performed at [facility], [place] [address], [place], [place] [address]    Special Requests   Final    BOTTLES DRAWN AEROBIC AND ANAEROBIC Blood Culture results may not be optimal due to an inadequate volume of blood received in culture bottles Performed at [facility] [address], [place], [place] [address]    Culture  Setup Time   Final    GRAM POSITIVE COCCI IN CLUSTERS IN BOTH AEROBIC AND ANAEROBIC BOTTLES CRITICAL RESULT CALLED TO, READ BACK BY AND VERIFIED WITH: J. GRIMSLEY, PHARMD (WL) AT 2110 ON 09/30/18 BY C. JESSUP, MT.    Culture (A)  Final    STAPHYLOCOCCUS AUREUS SUSCEPTIBILITIES PERFORMED ON PREVIOUS CULTURE WITHIN THE LAST 5 DAYS. Performed at [facility], [place] [address] [place], [place] [address]    Report Status 10/02/2018 FINAL  Final  Culture, blood (Routine x 2)     Status: Abnormal   Collection Time: 09/30/18  1:30 AM   Specimen: BLOOD RIGHT FOREARM  Result Value Ref Range Status   Specimen Description   Final    BLOOD RIGHT FOREARM Performed at [facility] [address] [place], [place]  [address]    Special Requests   Final    BOTTLES DRAWN AEROBIC AND ANAEROBIC Blood Culture adequate volume Performed at [facility] [address], [place], [place] [address]    Culture  Setup  Time   Final    GRAM POSITIVE COCCI IN CLUSTERS IN BOTH AEROBIC AND ANAEROBIC BOTTLES CRITICAL RESULT CALLED TO, READ BACK BY AND VERIFIED WITH: Lavell Luster, PHARMD (WL) AT 2110 ON 09/30/18 BY C. JESSUP, MT. Performed at [facility], [place] [address], [place], [place] [address]    Culture METHICILLIN RESISTANT STAPHYLOCOCCUS AUREUS (A)  Final   Report Status 10/02/2018 FINAL  Final   Organism ID, Bacteria METHICILLIN RESISTANT STAPHYLOCOCCUS AUREUS  Final      Susceptibility   Methicillin resistant staphylococcus aureus - MIC*    CIPROFLOXACIN >=8 RESISTANT Resistant     ERYTHROMYCIN >=8 RESISTANT Resistant     GENTAMICIN <=0.5 SENSITIVE Sensitive     OXACILLIN >=4 RESISTANT Resistant     TETRACYCLINE <=1 SENSITIVE Sensitive     VANCOMYCIN 1 SENSITIVE Sensitive     TRIMETH/SULFA <=10 SENSITIVE Sensitive     CLINDAMYCIN <=0.25 SENSITIVE Sensitive     RIFAMPIN <=0.5 SENSITIVE Sensitive     Inducible Clindamycin NEGATIVE Sensitive     * METHICILLIN RESISTANT STAPHYLOCOCCUS AUREUS  Blood Culture ID Panel (Reflexed)     Status: Abnormal   Collection Time: 09/30/18  1:30 AM  Result Value Ref Range Status   Enterococcus species NOT DETECTED NOT DETECTED Final   Listeria monocytogenes NOT DETECTED NOT DETECTED Final   Staphylococcus species DETECTED (A) NOT DETECTED Final    Comment: CRITICAL RESULT CALLED TO, READ BACK BY AND VERIFIED WITH: J. GRIMSLEY, PHARMD (WL) AT 2110 ON 09/30/18 BY C. JESSUP, MT.    Staphylococcus aureus (BCID) DETECTED (A) NOT DETECTED Final    Comment: Methicillin (oxacillin)-resistant Staphylococcus aureus (MRSA). MRSA is predictably resistant to beta-lactam antibiotics (except ceftaroline). Preferred therapy is vancomycin unless clinically  contraindicated. Patient requires contact precautions if  hospitalized. CRITICAL RESULT CALLED TO, READ BACK BY AND VERIFIED WITH: J. GRIMSLEY, PHARMD (WL) AT 2110 ON 09/30/18 BY C. JESSUP, MT.    Methicillin resistance DETECTED (A) NOT DETECTED Final    Comment: CRITICAL RESULT CALLED TO, READ BACK BY AND VERIFIED WITH: J. GRIMSLEY, PHARMD (WL) AT 2110 ON 09/30/18 BY C. JESSUP, MT.    Streptococcus species NOT DETECTED NOT DETECTED Final   Streptococcus agalactiae NOT DETECTED NOT DETECTED Final   Streptococcus pneumoniae NOT DETECTED NOT DETECTED Final   Streptococcus pyogenes NOT DETECTED NOT DETECTED Final   Acinetobacter baumannii NOT DETECTED NOT DETECTED Final   Enterobacteriaceae species NOT DETECTED NOT DETECTED Final   Enterobacter cloacae complex NOT DETECTED NOT DETECTED Final   Escherichia coli NOT DETECTED NOT DETECTED Final   Klebsiella oxytoca NOT DETECTED NOT DETECTED Final   Klebsiella pneumoniae NOT DETECTED NOT DETECTED Final   Proteus species NOT DETECTED NOT DETECTED Final   Serratia marcescens NOT DETECTED NOT DETECTED Final   Haemophilus influenzae NOT DETECTED NOT DETECTED Final   Neisseria meningitidis NOT DETECTED NOT DETECTED Final   Pseudomonas aeruginosa NOT DETECTED NOT DETECTED Final   Candida albicans NOT DETECTED NOT DETECTED Final   Candida glabrata NOT DETECTED NOT DETECTED Final   Candida krusei NOT DETECTED NOT DETECTED Final   Candida parapsilosis NOT DETECTED NOT DETECTED Final   Candida tropicalis NOT DETECTED NOT DETECTED Final    Comment: Performed at [facility], [place]. [address] [place], [place] [address]  SARS Coronavirus 2 [facility] order, Performed in [facility] hospital lab) Nasopharyngeal Nasopharyngeal Swab     Status: None   Collection Time: 09/30/18  1:40 AM   Specimen: Nasopharyngeal Swab  Result Value Ref Range Status   SARS Coronavirus 2 NEGATIVE NEGATIVE Final    Comment: (NOTE) If result is NEGATIVE SARS-CoV-2 target  nucleic acids are NOT DETECTED. The SARS-CoV-2 RNA is generally detectable in upper and lower  respiratory specimens during the acute phase of infection. The lowest  concentration of SARS-CoV-2 viral copies this assay can detect is 250  copies / mL. A negative result does not preclude SARS-CoV-2 infection  and should not be used as the sole basis for treatment or other  patient management decisions.  A negative result may occur with  improper specimen collection / handling, submission of specimen other  than nasopharyngeal swab, presence of viral mutation(s) within the  areas targeted by this assay, and inadequate number of viral copies  (<250 copies / mL). A negative result must be combined with clinical  observations, patient history, and epidemiological information. If result is POSITIVE SARS-CoV-2 target nucleic acids are DETECTED. The SARS-CoV-2 RNA is generally detectable in upper and lower  respiratory specimens dur ing the acute phase of infection.  Positive  results are indicative of active infection with SARS-CoV-2.  Clinical  correlation with patient history and other diagnostic information is  necessary to determine patient infection status.  Positive results do  not rule out bacterial infection or co-infection with other viruses. If result is PRESUMPTIVE POSTIVE SARS-CoV-2 nucleic acids MAY BE PRESENT.   A presumptive positive result was obtained on the submitted specimen  and confirmed on repeat testing.  While 2019 novel coronavirus  (SARS-CoV-2) nucleic acids may be present in the submitted sample  additional confirmatory testing may be necessary for epidemiological  and / or clinical management purposes  to differentiate between  SARS-CoV-2 and other Sarbecovirus currently known to infect humans.  If clinically indicated additional testing with an alternate test  methodology [id number]) is advised. The SARS-CoV-2 RNA is generally  detectable in upper and lower  respiratory sp ecimens during the acute  phase of infection. The expected result is Negative. Fact Sheet for Patients:  StrictlyIdeas.no Fact Sheet for Healthcare Providers: BankingDealers.co.za This test is not yet approved or cleared by the [place] FDA and has been authorized for detection and/or diagnosis of SARS-CoV-2 by FDA under an Emergency Use Authorization (EUA).  This EUA will remain in effect (meaning this test can be used) for the duration of the COVID-19 declaration under Section 564(b)(1) of the Act, 21 U.S.C. section 360bbb-3(b)(1), unless the authorization is terminated or revoked sooner. Performed at [facility], [place] [address], [place], [place] [address]   MRSA PCR Screening     Status: None   Collection Time: 09/30/18  4:01 AM   Specimen: Nasal Mucosa; Nasopharyngeal  Result Value Ref Range Status   MRSA by PCR NEGATIVE NEGATIVE Final    Comment:        The GeneXpert MRSA Assay (FDA approved for NASAL specimens only), is one component of a comprehensive MRSA colonization surveillance program. It is not intended to diagnose MRSA infection nor to guide or monitor treatment for MRSA infections. Performed at [facility], [place] [address] [place], [place] [address]   Surgical pcr screen     Status: None   Collection Time: 10/02/18  7:31 AM   Specimen: Nasal Mucosa; Nasal Swab  Result Value Ref Range Status   MRSA, PCR NEGATIVE NEGATIVE Final   Staphylococcus aureus NEGATIVE NEGATIVE Final    Comment: (NOTE) The Xpert SA Assay (FDA approved for NASAL specimens in patients  70 years of age and older), is one component of a comprehensive surveillance program. It is not intended to diagnose infection nor to guide or monitor treatment. Performed at [facility], [place] [address], [place], [place] [address]   Culture, blood (routine x 2)     Status: None   Collection  Time: 10/02/18  7:19 PM   Specimen: BLOOD LEFT HAND  Result Value Ref Range Status   Specimen Description BLOOD LEFT HAND  Final   Special Requests   Final    BOTTLES DRAWN AEROBIC AND ANAEROBIC Blood Culture results may not be optimal due to an inadequate volume of blood received in culture bottles   Culture   Final    NO GROWTH 5 DAYS Performed at [facility], [place] [address] [place], [place] [address]    Report Status 10/07/2018 FINAL  Final  Culture, blood (routine x 2)     Status: None   Collection Time: 10/02/18  9:02 PM   Specimen: BLOOD LEFT HAND  Result Value Ref Range Status   Specimen Description BLOOD LEFT HAND  Final   Special Requests   Final    BOTTLES DRAWN AEROBIC AND ANAEROBIC Blood Culture adequate volume   Culture   Final    NO GROWTH 5 DAYS Performed at [facility], [place] [address], [place], [place] [address]    Report Status 10/07/2018 FINAL  Final         Radiology Studies: No results found.      Scheduled Meds: . amLODipine  10 mg Oral Daily  . atorvastatin  40 mg Oral QHS  . carvedilol  12.5 mg Oral BID  . Chlorhexidine Gluconate Cloth  6 each Topical Daily  . DULoxetine  30 mg Oral Daily  . ferrous sulfate  325 mg Oral Q breakfast  . gabapentin  300 mg Oral TID  . haloperidol lactate  2 mg Intravenous Q6H  . insulin aspart  0-15 Units Subcutaneous TID WC  . insulin NPH Human  10 Units Subcutaneous BID AC & HS  . morphine  15 mg Oral Q12H  . nicotine  21 mg Transdermal Daily  . polyethylene glycol  17 g Oral Daily  . senna-docusate  2 tablet Oral BID   Continuous Infusions: . sodium chloride    . heparin 2,300 Units/hr (10/09/18 1123)  . sodium bicarbonate 150 mEq in dextrose 5% 1000 mL 150 mEq (10/09/18 0446)  . vancomycin 1,250 mg (10/08/18 0951)     LOS: 9 days   The patient is critically ill with multiple organ systems failure and requires high complexity decision making for assessment and support, frequent  evaluation and titration of therapies, application of advanced monitoring technologies and extensive interpretation of multiple databases. Critical Care Time devoted to patient care services described in this note  Time spent: 40 minutes     Thimothy Barretta, Geraldo Docker, MD [facility] Pager [phone]  If 7PM-7AM, please contact night-coverage [email or web address] Password [email or web address] 10/09/2018, 1:33 PM

## 2018-10-09 NOTE — Care Management (Signed)
    Durable Medical Equipment  (From admission, onward)         Start     Ordered   10/09/18 1507  For home use only DME 3 n 1  Once     10/09/18 1507   10/09/18 1507  For home use only DME Tub bench  Once    Comments: Tub transfer bench   10/09/18 1507   10/09/18 1505  For home use only DME lightweight manual wheelchair with seat cushion  Once    Comments: Patient suffers from  L BKA which impairs their ability to perform daily activities like ambulating  in the home.  A  cane will not resolve  issue with performing activities of daily living. A wheelchair will allow patient to safely perform daily activities. Patient is not able to propel themselves in the home using a standard weight wheelchair due to  L BKA. Patient can self propel in the lightweight wheelchair. Length of need lifetime  Accessories: elevating leg rests (ELRs), wheel locks, extensions and anti-tippers.  Seat and back cushions   10/09/18 1506

## 2018-10-09 NOTE — Progress Notes (Signed)
0429 patient yelling out,"I can't breathe, I can't breathe.  I need a mask." Pt's O2 sat on2LNC . Respirations regular.Breath sounds clear. Pt repositioned. Incentive spirometry encouraged.  Pt falling asleep. Pulse ox left on patient. Readings remained between 95-100%. Pt asleep at this time.

## 2018-10-09 NOTE — Progress Notes (Addendum)
ANTICOAGULATION CONSULT NOTE - Follow Up Consult  Pharmacy Consult for Heparin Indication: LV thrombus  Allergies  Allergen Reactions  . Amoxicillin-Pot Clavulanate Nausea And Vomiting    Did it involve swelling of the face/tongue/throat, SOB, or low BP? No Did it involve sudden or severe rash/hives, skin peeling, or any reaction on the inside of your mouth or nose? No Did you need to seek medical attention at a hospital or doctor's office? No When did it last happen?6 months ago If all above answers are "NO", may proceed with cephalosporin use.    Patient Measurements: Height: 6\' 3"  (190.5 cm) Weight: 181 lb (82.1 kg) IBW/kg (Calculated) : 84.5 Heparin Dosing Weight: 82 kg  Vital Signs: Temp: 97.5 F (36.4 C) (09/22 0429) Temp Source: Oral (09/22 0429) BP: 147/102 (09/22 0429) Pulse Rate: 87 (09/22 0429)  Labs: Recent Labs    10/07/18 0359 10/08/18 0228 10/09/18 0512  HGB 8.3* 8.6* 9.9*  HCT 27.0* 27.7* 31.7*  PLT 454* 419* 474*  HEPARINUNFRC 0.32 0.41 0.57  CREATININE 1.40* 1.27* 1.23    Estimated Creatinine Clearance: 76.9 mL/min (by C-G formula based on SCr of 1.23 mg/dL).  Assessment:   58 yr old male started on IV heparin for LV thrombus since 9/14 pm.  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     Heparin level remains therapeutic (0.57) on 2300 units/hr.    Goal of Therapy:  Heparin level 0.3-0.7 units/ml Monitor platelets by anticoagulation protocol: Yes   Plan:   Continue Heparin drip at 2300 units/hr.  Daily heparin level and CBC.  Follow up oral anticoagulation plan.  PTA Plavix stopped on 9/14.  Arty Baumgartner, [place] Pager: [phone] or phone: [phone] 10/09/2018,11:55 AM  Addendum:   To transition to Xarelto.   Will begin Xarelto 15 mg BID x 21 days with supper today, then 20 mg daily with supper.   Stop IV heparin when giving first dose of Xarelto.    Consuello Masse, RPh 10/09/2018 2:39 PM

## 2018-10-09 NOTE — TOC Progression Note (Addendum)
Transition of Care [facility]) - Progression Note    Patient Details  Name: Daniel Moon MRN: [id number] Date of Birth: [birth year]  [facility] of [facility]) CM/SW Contact  Jacalyn Lefevre Edson Snowball, RN Phone Number: 10/09/2018, 3:12 PM  Clinical Narrative:    Per charge nurse plan to discharge home on PO ABX. Patient sitting up in chair, pleasant. Girl friend present.   Patient from home with mother and girlfriend. Mother and girlfriend can assist with dressing changes. Patient agreeable to HHRN,PT,OT.   Ordered 3in 1 , walker , wheelchair, tub transfer bench.   Referral for home health given to [place] with [facility] awaiting call back to see if she can accept. [facility] unable to accept referral.   Dorian Pod with [facility] unable to accept referral.   Blanchard Mane with [facility] unable to accept referral.   Malachy Mood with [facility] unable to accept referral.  [facility] unable to accept referral.   Ivin Booty at [facility] unable to accept referral .   Mateo Flow with [facility] unable to accept referral .  Dian Situ with [facility] checking to see if he can accept referral.   Tommi Rumps with [facility] rechecking to see if he can accept referral.  Expected Discharge Plan: [place] Barriers to Discharge: Continued Medical Work up  Expected Discharge Plan and Services Expected Discharge Plan: [place]   Discharge Planning Services: CM Consult Post Acute Care Choice: [place] arrangements for the past 2 months: Single Family Home                 DME Arranged: 3-N-1, Walker rolling, Tub bench, Wheelchair manual DME Agency: [employer] Date DME Agency Contacted: 10/09/18 Time DME Agency Contacted: 80 Representative spoke with at DME Agency: [place] Arranged: PT, OT, RN           Social Determinants of Health ([place]) Interventions    Readmission Risk Interventions No flowsheet data found.

## 2018-10-10 LAB — GLUCOSE, CAPILLARY
Glucose-Capillary: 164 mg/dL — ABNORMAL HIGH (ref 70–99)
Glucose-Capillary: 133 mg/dL — ABNORMAL HIGH (ref 70–99)
Glucose-Capillary: 92 mg/dL (ref 70–99)
Glucose-Capillary: 86 mg/dL (ref 70–99)

## 2018-10-10 MED ORDER — METHADONE HCL 10 MG PO TABS
30.0000 mg | ORAL_TABLET | Freq: Every day | ORAL | Status: DC
  Administered 2018-10-10 – 2018-10-14 (×5): 30 mg via ORAL
  Filled 2018-10-10 (×5): qty 3

## 2018-10-10 MED ORDER — HYDROMORPHONE HCL 1 MG/ML IJ SOLN: 1.0000 mg | INTRAMUSCULAR | Status: DC | PRN

## 2018-10-10 MED ORDER — GABAPENTIN 300 MG PO CAPS
600.0000 mg | ORAL_CAPSULE | Freq: Three times a day (TID) | ORAL | Status: DC
  Administered 2018-10-10 – 2018-10-14 (×11): 600 mg via ORAL
  Filled 2018-10-10 (×11): qty 2

## 2018-10-10 MED ORDER — OXYCODONE HCL 5 MG PO TABS
10.0000 mg | ORAL_TABLET | ORAL | Status: DC | PRN
  Administered 2018-10-12 – 2018-10-13 (×2): 15 mg via ORAL
  Administered 2018-10-13: 10 mg via ORAL
  Filled 2018-10-10: qty 2
  Filled 2018-10-10 (×3): qty 3
  Filled 2018-10-10: qty 2

## 2018-10-10 MED ORDER — METHOCARBAMOL 1000 MG/10ML IJ SOLN
500.0000 mg | Freq: Four times a day (QID) | INTRAVENOUS | Status: DC | PRN
  Administered 2018-10-10: 04:00:00 500 mg via INTRAVENOUS
  Filled 2018-10-10 (×3): qty 5

## 2018-10-10 MED ORDER — HALOPERIDOL LACTATE 5 MG/ML IJ SOLN: 2.0000 mg | Freq: Four times a day (QID) | INTRAMUSCULAR | Status: DC | PRN

## 2018-10-10 NOTE — Progress Notes (Signed)
PROGRESS NOTE        PATIENT DETAILS Name: Daniel Moon Age: 58 y.o. Sex: male Date of Birth: 11-09-1960 Admit Date: 09/30/2018 Admitting Physician Mir Marry Guan, MD [id number], Solmon Ice, MD  Brief Narrative: Patient is a 58 y.o. male with history of heroin abuse, DM-2, peripheral neuropathy, PVD s/p balloon angioplasty of left tibial artery and PCI of distal SFA, history of left transmetatarsal amputation-presented with sepsis secondary to TMA stump ulceration/osteomyelitis and MRSA bacteremia.  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  Upon further evaluation he was also found to have a left ventricular thrombus.  See below for further details  Subjective: Continues to complain of pain in his left BKA stump and upper thigh area.  Claims he has been doing heroin daily for the past 6 months.  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  Previously on IV vancomycin-has been transitioned to Zyvox-for additional 2 weeks from 9/22.  TEE without any vegetations.  Repeat blood culture on 9/15 negative so far.  Peripheral arterial disease with ulceration and osteomyelitis involving the left TMA stump: Is s/p BKA on 9/15.  Patient refuses CIR or SNF-plan is for HHPT (per CM-having difficulty arranging home health agency)  Systolic heart failure (EF 30-35%) : Volume status is stable-continue Coreg  LV thrombus: Seen on TEE: Previously on IV heparin-now on Xarelto-prior hospitalist MD-Dr. 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  DM-2 (A1c 8.4): CBGs  stable-continue NPH 10 units twice daily and SSI.  HTN: Stable-continue with amlodipine and Coreg  Dyslipidemia: Continue Lipitor  Peripheral neuropathy: Increase Neurontin to 600 mg 3 times daily-should help with pain at the stump site  Anemia: Likely related to chronic disease-hemoglobin stable-no indication of blood loss  Heroin abuse/drug-seeking behavior: Apparently does heroin daily for 6 months-current pain management is clearly not controlling his pain-obtain an EKG to check QTC-following which we will initiate methadone.  He has been counseled to quit-I have spoken with case management who will provide him with outpatient drug rehab options.  I also spoke with his daughter and his fiance-who acknowledged that patient has been using heroin to control his pain (apparently was taken off chronic narcotics by his outpatient doctors).  I have asked family to see if they can help the patient get  established with a methadone clinic  Noncompliance to medication and to follow-up: Per patient-he had a "falling out" with his PCP-and has not seen PCP for the past 6 months.  Extensive counseling provided-encouraged patient to follow-up with his outpatient MDs.   Diet: Diet Order            Diet Carb Modified Fluid consistency: Thin; Room service appropriate? Yes  Diet effective now               DVT Prophylaxis: Full dose anticoagulation with Xarelto  Code Status: Full code   Family Communication: Daughter and fianc over the phone on 9/23  Disposition Plan: Remain inpatient-Home in the next few days  Antimicrobial agents: Anti-infectives (From admission, onward)   Start     Dose/Rate Route Frequency Ordered  Stop   10/09/18 1500  linezolid (ZYVOX) tablet 600 mg     600 mg Oral Every 12 hours 10/09/18 1346     10/06/18 1000  vancomycin (VANCOCIN) 1,250 mg in sodium chloride 0.9 % 250 mL IVPB  Status:  Discontinued     1,250 mg 166.7 mL/hr over 90 Minutes Intravenous Every 48  hours 10/05/18 0001 10/09/18 1346   10/05/18 0000  linezolid (ZYVOX) 600 MG tablet     600 mg Oral 2 times daily 10/05/18 1319 10/19/18 2359   10/02/18 1530  piperacillin-tazobactam (ZOSYN) IVPB 3.375 g  Status:  Discontinued     3.375 g 100 mL/hr over 30 Minutes Intravenous  Once 10/02/18 1518 10/02/18 1520   10/02/18 1530  piperacillin-tazobactam (ZOSYN) IVPB 3.375 g     3.375 g 100 mL/hr over 30 Minutes Intravenous  Once 10/02/18 1521 10/02/18 1535   10/01/18 2200  vancomycin (VANCOCIN) 1,250 mg in sodium chloride 0.9 % 250 mL IVPB  Status:  Discontinued     1,250 mg 166.7 mL/hr over 90 Minutes Intravenous Every 24 hours 10/01/18 1035 10/05/18 0000   09/30/18 2200  vancomycin (VANCOCIN) 1,750 mg in sodium chloride 0.9 % 500 mL IVPB  Status:  Discontinued     1,750 mg 250 mL/hr over 120 Minutes Intravenous Every 24 hours 09/30/18 0523 10/01/18 1035   09/30/18 0600  piperacillin-tazobactam (ZOSYN) IVPB 3.375 g  Status:  Discontinued     3.375 g 12.5 mL/hr over 240 Minutes Intravenous Every 8 hours 09/30/18 0229 10/02/18 1012   09/30/18 0145  piperacillin-tazobactam (ZOSYN) IVPB 3.375 g     3.375 g 100 mL/hr over 30 Minutes Intravenous  Once 09/30/18 0131 09/30/18 0203   09/30/18 0145  vancomycin (VANCOCIN) 1,750 mg in sodium chloride 0.9 % 500 mL IVPB     1,750 mg 250 mL/hr over 120 Minutes Intravenous  Once 09/30/18 0141 09/30/18 0206      Procedures: 9/15 left BKA 9/18 TEE  CONSULTS:  ID and vascular surgery  Time spent: 25- minutes-Greater than 50% of this time was spent in counseling, explanation of diagnosis, planning of further management, and coordination of care.  MEDICATIONS: Scheduled Meds:  amLODipine  10 mg Oral Daily   atorvastatin  40 mg Oral QHS   carvedilol  12.5 mg Oral BID   Chlorhexidine Gluconate Cloth  6 each Topical Daily   ferrous sulfate  325 mg Oral Q breakfast   gabapentin  300 mg Oral TID   haloperidol lactate  2 mg Intravenous Q6H    insulin aspart  0-15 Units Subcutaneous TID WC   insulin NPH Human  10 Units Subcutaneous BID AC & HS   linezolid  600 mg Oral Q12H   morphine  15 mg Oral Q12H   nicotine  21 mg Transdermal Daily   polyethylene glycol  17 g Oral Daily   rivaroxaban  15 mg Oral BID WC   senna-docusate  2 tablet Oral BID   Continuous Infusions:  sodium chloride     methocarbamol (ROBAXIN) IV 500 mg (10/10/18 0405)   PRN Meds:.acetaminophen **OR** acetaminophen, methocarbamol (ROBAXIN) IV, morphine injection, ondansetron **OR** ondansetron (ZOFRAN) IV, oxyCODONE-acetaminophen   PHYSICAL EXAM: Vital signs: Vitals:   10/09/18 2122 10/10/18 0456 10/10/18 0500 10/10/18 0831  BP: (!) 145/91 (!) 142/96  (!) 153/96  Pulse: 87 86    Resp: 18 18    Temp: 98.1 F (36.7 C) 97.7 F (36.5 C)  (!) 97.4 F (36.3 C)  TempSrc: Oral Oral  Oral  SpO2: 98% 95%    Weight:   82.3 kg   Height:       Filed Weights   10/05/18 1118 10/06/18 2201 10/10/18 0500  Weight: 81.9 kg 82.1 kg 82.3 kg   Body mass index is 22.68 kg/m.   Gen Exam:Alert awake-not in any distress HEENT:atraumatic, normocephalic Chest: B/L clear to auscultation anteriorly CVS:S1S2 regular Abdomen:soft non tender, non distended Extremities: Left BKA Neurology: Non focal Skin: no rash  I have personally reviewed following labs and imaging studies  LABORATORY DATA: CBC: Recent Labs  Lab 10/05/18 0323 10/06/18 0925 10/07/18 0359 10/08/18 0228 10/09/18 0512  WBC 11.4* 11.3* 10.8* 9.5 9.7  HGB 8.2* 8.2* 8.3* 8.6* 9.9*  HCT 24.9* 26.8* 27.0* 27.7* 31.7*  MCV 70.7* 74.0* 73.8* 73.5* 72.9*  PLT 272 417* 454* 419* 474*    Basic Metabolic Panel: Recent Labs  Lab 10/05/18 0323 10/06/18 0925 10/07/18 0359 10/08/18 0228 10/09/18 0512  NA 135 135 136 134* 133*  K 4.5 4.2 4.2 3.9 4.2  CL 110 106 104 103 103  CO2 17* 19* 23 21* 23  GLUCOSE 76 134* 120* 133* 109*  BUN 18 15 14 11 10   CREATININE 1.55* 1.40* 1.40* 1.27* 1.23   CALCIUM 7.7* 8.0* 8.0* 8.1* 8.4*  MG 1.8 1.8 1.8 1.7 1.7  PHOS  --  3.2  --   --   --     GFR: Estimated Creatinine Clearance: 77.1 mL/min (by C-G formula based on SCr of 1.23 mg/dL).  Liver Function Tests: No results for input(s): AST, ALT, ALKPHOS, BILITOT, PROT, ALBUMIN in the last 168 hours. No results for input(s): LIPASE, AMYLASE in the last 168 hours. No results for input(s): AMMONIA in the last 168 hours.  Coagulation Profile: No results for input(s): INR, PROTIME in the last 168 hours.  Cardiac Enzymes: No results for input(s): CKTOTAL, CKMB, CKMBINDEX, TROPONINI in the last 168 hours.  BNP (last 3 results) No results for input(s): PROBNP in the last 8760 hours.  HbA1C: No results for input(s): HGBA1C in the last 72 hours.  CBG: Recent Labs  Lab 10/09/18 0818 10/09/18 1227 10/09/18 1655 10/09/18 2120 10/10/18 0814  GLUCAP 104* 106* 127* 127* 164*    Lipid Profile: No results for input(s): CHOL, HDL, LDLCALC, TRIG, CHOLHDL, LDLDIRECT in the last 72 hours.  Thyroid Function Tests: No results for input(s): TSH, T4TOTAL, FREET4, T3FREE, THYROIDAB in the last 72 hours.  Anemia Panel: No results for input(s): VITAMINB12, FOLATE, FERRITIN, TIBC, IRON, RETICCTPCT in the last 72 hours.  Urine analysis:    Component Value Date/Time   COLORURINE AMBER (A) 09/30/2018 0051   APPEARANCEUR CLOUDY (A) 09/30/2018 0051   LABSPEC 1.024 09/30/2018 0051   PHURINE 5.0 09/30/2018 0051   GLUCOSEU NEGATIVE 09/30/2018 0051   HGBUR NEGATIVE 09/30/2018 0051   BILIRUBINUR NEGATIVE 09/30/2018 0051   KETONESUR NEGATIVE 09/30/2018 0051   PROTEINUR 100 (A) 09/30/2018 0051   NITRITE NEGATIVE 09/30/2018 0051   LEUKOCYTESUR NEGATIVE 09/30/2018 0051    Sepsis Labs: Lactic Acid, Venous    Component Value Date/Time   LATICACIDVEN 1.4 09/30/2018 0054    MICROBIOLOGY: Recent Results (from the past 240 hour(s))  Surgical pcr screen     Status: None   Collection Time: 10/02/18   7:31 AM   Specimen: Nasal Mucosa; Nasal Swab  Result Value Ref Range Status   MRSA, PCR NEGATIVE NEGATIVE Final   Staphylococcus aureus NEGATIVE NEGATIVE Final    Comment: (NOTE) The Xpert SA Assay (  FDA approved for NASAL specimens in patients 47 years of age and older), is one component of a comprehensive surveillance program. It is not intended to diagnose infection nor to guide or monitor treatment. Performed at [facility], [place] [address], [place], [place] [address]   Culture, blood (routine x 2)     Status: None   Collection Time: 10/02/18  7:19 PM   Specimen: BLOOD LEFT HAND  Result Value Ref Range Status   Specimen Description BLOOD LEFT HAND  Final   Special Requests   Final    BOTTLES DRAWN AEROBIC AND ANAEROBIC Blood Culture results may not be optimal due to an inadequate volume of blood received in culture bottles   Culture   Final    NO GROWTH 5 DAYS Performed at [facility], [place] [address], [place], [place] [address]    Report Status 10/07/2018 FINAL  Final  Culture, blood (routine x 2)     Status: None   Collection Time: 10/02/18  9:02 PM   Specimen: BLOOD LEFT HAND  Result Value Ref Range Status   Specimen Description BLOOD LEFT HAND  Final   Special Requests   Final    BOTTLES DRAWN AEROBIC AND ANAEROBIC Blood Culture adequate volume   Culture   Final    NO GROWTH 5 DAYS Performed at [facility], [place] [address], [place], [place] [address]    Report Status 10/07/2018 FINAL  Final    RADIOLOGY STUDIES/RESULTS: Dg Chest Port 1 View  Result Date: 09/30/2018 CLINICAL DATA:  Suspected sepsis. EXAM: PORTABLE CHEST 1 VIEW COMPARISON:  Radiograph 07/29/2018, 02/22/2018 FINDINGS: Improved aeration from prior exams. Mild cardiomegaly with unchanged mediastinal contours. No acute airspace disease. No pulmonary edema, pleural fluid, or pneumothorax. No acute osseous abnormalities. IMPRESSION: Mild cardiomegaly without acute abnormality. Electronically  Signed   By: Keith Rake M.D.   On: 09/30/2018 01:28   Dg Foot Complete Left  Result Date: 09/30/2018 CLINICAL DATA:  Left foot pain. History of partial amputation. EXAM: LEFT FOOT - COMPLETE 3+ VIEW COMPARISON:  Radiograph 07/16/2018 FINDINGS: Prior transmetatarsal amputation. Soft tissue defect about the plantar lateral aspect of the foot. Osseous resorption with bony destructive change involving the in situ second through fifth metatarsals, progressed from prior exam. Interval bony destruction involving the cuboid with fragmentation laterally. Nonspecific decreased density involving the anterior cortex of the calcaneus. Generalized soft tissue edema. 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 Soft tissue defect overlies the plantar lateral aspect of the foot. Generalized soft tissue edema. Electronically Signed   By: Keith Rake M.D.   On: 09/30/2018 01:31     LOS: 10 days   Oren Binet, MD  [facility]  If 7PM-7AM, please contact night-coverage  Please page via [email or web address]  Go to [email or web address] and use [place]'s universal password to access. If you do not have the password, please contact the hospital operator.  Locate the [facility] provider you are looking for under [facility] and page to a number that you can be directly reached. If you still have difficulty reaching the provider, please page the [facility] (Director on Call) for the Hospitalists listed on amion for assistance.  10/10/2018, 10:53 AM

## 2018-10-10 NOTE — Progress Notes (Addendum)
2240- Patient called out for pain medicine, rating pain 10/10 and was administered PRN morphine. The patient was sleepy and fell asleep while administering the morphine.  2250- Patient called out for pain medicine again and again rating pain 10/10. Administered PRN Percocet at 2315. Patient fell asleep while taking the medication and had to be woken up by this nurse to finish taking his pain medicine.  2320- Patient called out again for pain medicine. This RN ex[plained to the patient that he just had Percocet 5 minutes ago and that he needed to wait just a little longer for the medicine to work.  2350- Patient has been calling out every 2-3 minutes asking for pain medicine. Was administered scheduled Haldol and PRN tylenol.  0030- Patient continues to call out every 2-3 minutes for pain medicine despite being told by this nurse and the charge nurse that there is no more pain medicine he can have right now.  0100- Paged the on call MD about patient's pain level and continuous calling and patient saying he is going to leave AMA.  0120- Received a call from on call NP for PRN Robaxin. Will administer and continue to monitor.

## 2018-10-10 NOTE — Plan of Care (Signed)

## 2018-10-10 NOTE — Progress Notes (Signed)
Pt pain score now a 6 from 10 this am.

## 2018-10-10 NOTE — TOC Progression Note (Addendum)
Transition of Care [facility]) - Progression Note    Patient Details  Name: Marsean Hoeger MRN: [id number] Date of Birth: 1960-07-15  Transition of Care [facility]) CM/SW Contact  Jacalyn Lefevre Edson Snowball, RN Phone Number: 10/10/2018, 1:42 PM  Clinical Narrative:     Patient to be started on methadone today. Provided resources to patient to establish care for methadone dosing post discharge. Patient's girlfriend has called [facility] ( [facility]) [phone] , address is [address]. Les explained to patient and girlfriend , patient will have to go there daily to pick up methadone, girlfriend states she can transport him there daily from [employer] address.   Per Romilda Joy they can admit patient on Monday 10/15/18 , however, they cannot provide methadone until Wednesday 10/17/18 unless hospital MD feels comfortable completing Guest Dosing Form.   Patient signed release of information . Requested clinicals faxed to [place] [facility].   Also, [facility] can accept referral for HHPT/OT but they are unable to provide [facility] [place]. Spoke to Dagoberto Ligas with surgery. Patient's incision is closed and does not require HHRN. Asked attending for just HHPT/OT orders. Patient aware and feels comfortable with his girlfriend changing dressing.    Above explained to patient and MD.   Expected Discharge Plan: [place] Barriers to Discharge: Continued Medical Work up  Expected Discharge Plan and Services Expected Discharge Plan: [place]   Discharge Planning Services: CM Consult Post Acute Care Choice: [place] arrangements for the past 2 months: Single Family Home                 DME Arranged: 3-N-1, Walker rolling, Tub bench, Wheelchair manual DME Agency: [employer] Date DME Agency Contacted: 10/09/18 Time DME Agency Contacted: [phone] Representative spoke with at DME Agency: [place]: PT, OT, RN           Social Determinants of Health  ([place]) Interventions    Readmission Risk Interventions No flowsheet data found.

## 2018-10-10 NOTE — Progress Notes (Signed)
Did a pharmacy consult with Lattie Haw about pts pain score and she suggested Ativan, Toradol and could increase the MS Contin to 30 BID.

## 2018-10-10 NOTE — Progress Notes (Signed)
Asked pt more in depth if he took anything at home like drugs, he stated that he takes heroin every day and the last time he took something was right before he came in.  Dr. Sloan Leiter informed and he will come and see the pt.

## 2018-10-10 NOTE — Progress Notes (Signed)
Patient refused Ace wrap for left leg during dressing change.

## 2018-10-11 LAB — BASIC METABOLIC PANEL
Anion gap: 10 (ref 5–15)
BUN: 9 mg/dL (ref 6–20)
CO2: 24 mmol/L (ref 22–32)
Calcium: 8.5 mg/dL — ABNORMAL LOW (ref 8.9–10.3)
Chloride: 99 mmol/L (ref 98–111)
Creatinine, Ser: 1.31 mg/dL — ABNORMAL HIGH (ref 0.61–1.24)
GFR calc Af Amer: >60 mL/min (ref >60)
GFR calc non Af Amer: >60 mL/min (ref >60)
Glucose, Bld: 80 mg/dL (ref 70–99)
Potassium: 4.2 mmol/L (ref 3.5–5.1)
Sodium: 133 mmol/L — ABNORMAL LOW (ref 135–145)

## 2018-10-11 LAB — GLUCOSE, CAPILLARY
Glucose-Capillary: 85 mg/dL (ref 70–99)
Glucose-Capillary: 100 mg/dL — ABNORMAL HIGH (ref 70–99)
Glucose-Capillary: 113 mg/dL — ABNORMAL HIGH (ref 70–99)
Glucose-Capillary: 126 mg/dL — ABNORMAL HIGH (ref 70–99)

## 2018-10-11 NOTE — Progress Notes (Signed)
Physical Therapy Treatment Patient Details Name: Daniel Moon MRN: [id number] DOB: Jul 29, 1960 Today's Date: 10/11/2018    History of Present Illness Pt is a 58 y.o. male admitted 09/30/18 with nonhealing L transmet amputation wound; worked up for sepsis likely due to osteomyelitis. Echo 9/14 with LV thrombus; heparin level therapeutic range as of 9/18. 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 PMH includes CHF, HTN, Hep-C, DM, PVD.    PT Comments    Pt performed supine and sidelying exercises and plan next session for OOB mobility.  On arrival he was slurring his words and not making sense.  As tx progressed he became more alert.  He continues to decline rehab and will require HHPT.  Will continue to follow acutely.     Follow Up Recommendations  Home health PT;Supervision/Assistance - 24 hour(adamantly refusing CIR and snf will need HHPT)     Equipment Recommendations  Rolling walker with 5" wheels;3in1 (PT);Wheelchair (measurements PT);Wheelchair cushion (measurements PT)    Recommendations for Other Services       Precautions / Restrictions Precautions Precautions: Fall Restrictions Weight Bearing Restrictions: No LLE Weight Bearing: Non weight bearing    Mobility  Bed Mobility Overal bed mobility: Modified Independent             General bed mobility comments: Mod indep with HOB elevated, VCs to scoot to [facility] and boost hips for bed pad placement.  Transfers                    Ambulation/Gait                 Stairs             Wheelchair Mobility    Modified Rankin (Stroke Patients Only)       Balance                                            Cognition Arousal/Alertness: Awake/alert Behavior During Therapy: WFL for tasks assessed/performed;Flat affect Overall Cognitive Status: Impaired/Different from baseline Area of Impairment: Safety/judgement;Problem solving;Memory;Following commands                     Memory: Decreased  short-term memory Following Commands: Follows one step commands with increased time;Follows one step commands inconsistently Safety/Judgement: Decreased awareness of safety;Decreased awareness of deficits   Problem Solving: Slow processing;Decreased initiation        Exercises General Exercises - Lower Extremity Ankle Circles/Pumps: AROM;Right;15 reps;Supine Quad Sets: AROM;Right;10 reps;Supine Heel Slides: AROM;Right;10 reps;Supine Hip ABduction/ADduction: AROM;Right;10 reps;Supine Straight Leg Raises: AAROM;Right;10 reps;Supine Amputee Exercises Quad Sets: AROM;Left;10 reps;Supine Hip Extension: AROM;Left;10 reps;Sidelying Hip ABduction/ADduction: AROM;Left;10 reps;Sidelying Hip Flexion/Marching: AROM;Left;10 reps;Supine Knee Flexion: AROM;Left;10 reps;Supine    General Comments        Pertinent Vitals/Pain Pain Assessment: No/denies pain Faces Pain Scale: Hurts little more Pain Location: L residual limb Pain Descriptors / Indicators: Operative site guarding;Grimacing;Discomfort Pain Intervention(s): Monitored during session;Repositioned    Home Living                      Prior Function            PT Goals (current goals can now be found in the care plan section) Acute Rehab PT Goals Patient Stated Goal: go home Potential to Achieve Goals: Good Progress towards PT goals: Progressing toward goals    Frequency  Min 3X/week      PT Plan Current plan remains appropriate    Co-evaluation              AM-PAC PT "6 Clicks" Mobility   Outcome Measure  Help needed turning from your back to your side while in a flat bed without using bedrails?: None Help needed moving from lying on your back to sitting on the side of a flat bed without using bedrails?: None Help needed moving to and from a bed to a chair (including a wheelchair)?: A Little Help needed standing up from a chair using your arms (e.g., wheelchair or bedside chair)?: A Little Help  needed to walk in hospital room?: A Little Help needed climbing 3-5 steps with a railing? : A Lot 6 Click Score: 19    End of Session Equipment Utilized During Treatment: Gait belt Activity Tolerance: Patient limited by pain Patient left: in chair;with call bell/phone within reach Nurse Communication: Mobility status PT Visit Diagnosis: Other abnormalities of gait and mobility (R26.89);Pain Pain - Right/Left: Left Pain - part of body: Leg     Time: 1722-1743 PT Time Calculation (min) (ACUTE ONLY): 21 min  Charges:  $Therapeutic Exercise: 8-22 mins                     Governor Rooks, PTA Acute Rehabilitation Services Pager [phone] Office [phone]     Shantrice Rodenberg Eli Hose 10/11/2018, 6:02 PM

## 2018-10-11 NOTE — Progress Notes (Signed)
Occupational Therapy Treatment Patient Details Name: Daniel Moon MRN: [id number] DOB: 22-Jul-1960 Today's Date: 10/11/2018    History of present illness Pt is a 58 y.o. male admitted 09/30/18 with nonhealing L transmet amputation wound; worked up for sepsis likely due to osteomyelitis. Echo 9/14 with LV thrombus; heparin level therapeutic range as of 9/18. 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 PMH includes CHF, HTN, Hep-C, DM, PVD.   OT comments  Pt making steady progress towards OT goals this session. Session focus on functional mobility in preparation for higher level ADLs. Pt required MAX encouragement to participate in session as pt upset he isn't dc'ing today. Pt complete bed mobility MOD I with use of bed features. MIN A sit>stand from elevated surface with RW. MIN guard for stand pivot transfer from EOB>recliner with RW. DC plan remains appropriate. Will continue to follow for acute OT needs.    Follow Up Recommendations  Home health OT;Supervision/Assistance - 24 hour    Equipment Recommendations  3 in 1 bedside commode;Tub/shower bench;Wheelchair (measurements OT);Wheelchair cushion (measurements OT)    Recommendations for Other Services      Precautions / Restrictions Precautions Precautions: Fall Restrictions Weight Bearing Restrictions: No LLE Weight Bearing: Non weight bearing       Mobility Bed Mobility Overal bed mobility: Modified Independent             General bed mobility comments: Mod indep with HOB elevated  Transfers Overall transfer level: Needs assistance Equipment used: Rolling walker (2 wheeled) Transfers: Sit to/from [facility] Sit to Stand: Min assist Stand pivot transfers: Min guard       General transfer comment: MIN A sit>stand from elevated surface; tactile/ verbal cues to shift weight anteriorly in standing and stand up tall; MIN guard for transfer; cues for hand placement and safety with RW    Balance Overall balance assessment:  Needs assistance Sitting-balance support: No upper extremity supported;Feet supported Sitting balance-[name]: Good Sitting balance - Comments: able to static sit at EOB with min guard   Standing balance support: Bilateral upper extremity supported Standing balance-[name]: Poor Standing balance comment: Reliant on UE support                           ADL either performed or assessed with clinical judgement   ADL Overall ADL's : Needs assistance/impaired Eating/Feeding: Independent;Sitting                       Toilet Transfer: Minimal assistance;Ambulation;RW;Min guard Toilet Transfer Details (indicate cue type and reason): MIN A for sit>stand from elevated surface for simulated toilet transfer to recliner; MIN guard for stand pivot transfer; cues for safety with RW and reaching back to sit         Functional mobility during ADLs: Minimal assistance;Min guard General ADL Comments: pt upset he is not dc'ing home today; declined further ADLs other than transfer to recliner     Vision Baseline Vision/History: Wears glasses Wears Glasses: Reading only Patient Visual Report: No change from baseline     Perception     Praxis      Cognition Arousal/Alertness: Awake/alert Behavior During Therapy: WFL for tasks assessed/performed;Flat affect Overall Cognitive Status: Impaired/Different from baseline Area of Impairment: Safety/judgement;Problem solving;Memory;Following commands                     Memory: Decreased short-term memory Following Commands: Follows one step commands with increased time;Follows one  step commands inconsistently Safety/Judgement: Decreased awareness of safety;Decreased awareness of deficits   Problem Solving: Slow processing;Decreased initiation General Comments: perseverating on wanting to go home despite education on why pt is not dc'ing today; MAX encouragement to participate and orient pt to session and current  deficits        Exercises     Shoulder Instructions       General Comments encourage pt to rest knee in extension while seated in recliner    Pertinent Vitals/ Pain       Pain Assessment: No/denies pain  Home Living                                          Prior Functioning/Environment              Frequency  Min 2X/week        Progress Toward Goals  OT Goals(current goals can now be found in the care plan section)  Progress towards OT goals: Progressing toward goals  Acute Rehab OT Goals Patient Stated Goal: go home OT Goal Formulation: With patient Time For Goal Achievement: 10/19/18 Potential to Achieve Goals: Good  Plan Discharge plan remains appropriate    Co-evaluation                 AM-PAC OT "6 Clicks" Daily Activity     Outcome Measure   Help from another person eating meals?: None Help from another person taking care of personal grooming?: None Help from another person toileting, which includes using toliet, bedpan, or urinal?: A Lot Help from another person bathing (including washing, rinsing, drying)?: A Lot Help from another person to put on and taking off regular upper body clothing?: A Little Help from another person to put on and taking off regular lower body clothing?: Total 6 Click Score: 16    End of Session Equipment Utilized During Treatment: Gait belt;Rolling walker  OT Visit Diagnosis: Pain   Activity Tolerance Patient tolerated treatment well   Patient Left in chair;with call bell/phone within reach   Nurse Communication Mobility status        Time: [id number] OT Time Calculation (min): 14 min  Charges: OT General Charges $OT Visit: 1 Visit OT Treatments $Therapeutic Activity: 8-22 mins  Lucas, Domino [place] 10/11/2018, 9:44 AM

## 2018-10-11 NOTE — TOC Progression Note (Addendum)
Transition of Care [facility]) - Progression Note    Patient Details  Name: Daniel Moon MRN: [id number] Date of Birth: [birth year]  Transition of Care [facility]) CM/SW Contact  Jacalyn Lefevre Edson Snowball, RN Phone Number: 10/11/2018, 10:25 AM  Clinical Narrative:     Discussed [facility] with Dr Tyrell Antonio. She is willing to sign Guest Dosing Form . Called and left a message for Les at [facility] awaiting call back.   Les with [facility] returned call. He will have his medical director review patient's clinicals. Les will call NCM back once determined if patient is accepted. If patient accepted to program Romilda Joy will fax Guest Dosing Form for MD signature. Romilda Joy will call patient's girl friend.   Hopeful  patient will discharge from hospital on Sunday October 14, 2018 and be admitted to [facility] OP services on Monday October 15, 2018.   Guest Dosing Form signed by MD and faxed to [facility] [phone], called Les at [place] and left message [phone]  Expected Discharge Plan: [place] Barriers to Discharge: Continued Medical Work up  Expected Discharge Plan and Services Expected Discharge Plan: [place]   Discharge Planning Services: CM Consult Post Acute Care Choice: [place] arrangements for the past 2 months: Single Family Home                 DME Arranged: 3-N-1, Walker rolling, Tub bench, Wheelchair manual DME Agency: [employer] Date DME Agency Contacted: 10/09/18 Time DME Agency Contacted: 34 Representative spoke with at DME Agency: [facility] Arranged: PT, OT, RN           Social Determinants of Health ([place]) Interventions    Readmission Risk Interventions No flowsheet data found.

## 2018-10-11 NOTE — Progress Notes (Signed)
PROGRESS NOTE    Daniel Moon  [id number] DOB: 1960/12/15 DOA: [id number] PCP: Sanjuana Letters, MD   Brief Narrative: 58 year old with past medical history significant for heroin abuse, diabetes type 2, peripheral neuropathy, PVD status post balloon angioplasty of the left tibial artery and PCI of the distal SFA, history of left transmetatarsal amputation presented with sepsis secondary to TMA stump ulceration osteomyelitis and MRSA bacteremia.  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  Upon further evaluation he was also found to have left ventricular thrombus.   Assessment & Plan:   Principal Problem:   MRSA bacteremia Active Problems:   HTN (hypertension)   Diabetes ([place])   HLD (hyperlipidemia)   COPD (chronic obstructive pulmonary disease) (HCC)   Sepsis ([place])   Open wound of left foot   Osteomyelitis (HCC)   Microcytic anemia   PAD (peripheral artery disease) (HCC)   Peripheral neuropathy   Cigarette smoker   S/P transmetatarsal amputation of foot, left (HCC)   Pressure injury of skin   Chronic renal insufficiency, stage 3 (moderate) (HCC)   Paroxysmal atrial fibrillation (HCC)   Unintentional weight loss   Poor dentition   Drug-seeking behavior   1-sepsis secondary to left TMA stump ulceration with underlying osteomyelitis and MRSA bacteremia status 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 Previously on IV vancomycin has been transitioned to Zyvox for additional 2 weeks from 922 TEE without any vegetation.   Peripheral arterial disease with ulceration and osteomyelitis involving the left TMA stump: Status post BKA on 9/15 Patient refused CIR or scale. Working on home health.  Systolic heart failure: Ejection fraction 35% continue with Coreg  Left ventricular thrombus: Seen on TEE.  He was previously on IV heparin and transition to Xarelto.  AKI on chronic kidney disease a stage II III; Stable   Hypertension: Continue with amlodipine and Coreg  Dyslipidemia continue Lipitor  Peripheral neuropathy: Continue with gabapentin  Anemia; likely related to chronic disease  Heroin abuse drug-seeking behavior: Apparently he does heroin daily for 6 months.  He was a started on methadone.  We are arranging follow-up at the methadone clinic starting on Monday.  He will remain in the hospital until Sunday   Estimated body mass index is 20.47 kg/m as calculated from the following:   Height as of this encounter: 6\' 3"  (1.905 m).   Weight as of this encounter: 74.3 kg.   DVT prophylaxis: Xarelto Code Status: Full code Family Communication: Care discussed with patient  disposition Plan: Remain in the hospital, sooner that we were able to set him up with a methadone clinic was on Monday Consultants:   Ortho  Procedures:   ED  Antimicrobials:  Zyvox  Subjective: Pain is controlled now he wants to go home.  I explained to him that sooner that we were able to set him up for the methadone clinic was on Monday  Objective: Vitals:   10/10/18 1302 10/10/18 2036 10/11/18 0355 10/11/18 0705  BP: (!) 132/91 126/88 132/88   Pulse: 78 80 85   Resp:  16 16   Temp:  98.1 F (36.7 C) 97.7 F (36.5 C)   TempSrc:  Oral Oral   SpO2: 95% 95% 95%   Weight:   75.6 kg 74.3 kg  Height:        Intake/Output Summary (Last 24 hours) at 10/11/2018 0805 Last data filed at 10/11/2018 0700 Gross per 24 hour  Intake 450 ml  Output 1100  ml  Net -650 ml   Filed Weights   10/10/18 0500 10/11/18 0355 10/11/18 0705  Weight: 82.3 kg 75.6 kg 74.3 kg    Examination:  General exam: Appears calm and comfortable  Respiratory system: Clear to auscultation. Respiratory effort normal. Cardiovascular system: S1 & S2 heard, RRR. No JVD, murmurs, rubs, gallops or clicks. No pedal edema. Gastrointestinal system: Abdomen is nondistended, soft and nontender. No organomegaly or masses felt. Normal bowel sounds  heard. Central nervous system: Alert and oriented.  Skin: No rashes, lesions or ulcers Extremities: Left BKA with clean dressing  Data Reviewed: I have personally reviewed following labs and imaging studies  CBC: Recent Labs  Lab 10/05/18 0323 10/06/18 0925 10/07/18 0359 10/08/18 0228 10/09/18 0512  WBC 11.4* 11.3* 10.8* 9.5 9.7  HGB 8.2* 8.2* 8.3* 8.6* 9.9*  HCT 24.9* 26.8* 27.0* 27.7* 31.7*  MCV 70.7* 74.0* 73.8* 73.5* 72.9*  PLT 272 417* 454* 419* 123XX123*   Basic Metabolic Panel: Recent Labs  Lab 10/05/18 0323 10/06/18 0925 10/07/18 0359 10/08/18 0228 10/09/18 0512 10/11/18 0335  NA 135 135 136 134* 133* 133*  K 4.5 4.2 4.2 3.9 4.2 4.2  CL 110 106 104 103 103 99  CO2 17* 19* 23 21* 23 24  GLUCOSE 76 134* 120* 133* 109* 80  BUN 18 15 14 11 10 9   CREATININE 1.55* 1.40* 1.40* 1.27* 1.23 1.31*  CALCIUM 7.7* 8.0* 8.0* 8.1* 8.4* 8.5*  MG 1.8 1.8 1.8 1.7 1.7  --   PHOS  --  3.2  --   --   --   --    GFR: Estimated Creatinine Clearance: 65.4 mL/min (A) (by C-G formula based on SCr of 1.31 mg/dL (H)). Liver Function Tests: No results for input(s): AST, ALT, ALKPHOS, BILITOT, PROT, ALBUMIN in the last 168 hours. No results for input(s): LIPASE, AMYLASE in the last 168 hours. No results for input(s): AMMONIA in the last 168 hours. Coagulation Profile: No results for input(s): INR, PROTIME in the last 168 hours. Cardiac Enzymes: No results for input(s): CKTOTAL, CKMB, CKMBINDEX, TROPONINI in the last 168 hours. BNP (last 3 results) No results for input(s): PROBNP in the last 8760 hours. HbA1C: No results for input(s): HGBA1C in the last 72 hours. CBG: Recent Labs  Lab 10/09/18 2120 10/10/18 0814 10/10/18 1220 10/10/18 1723 10/10/18 2040  GLUCAP 127* 164* 133* 92 86   Lipid Profile: No results for input(s): CHOL, HDL, LDLCALC, TRIG, CHOLHDL, LDLDIRECT in the last 72 hours. Thyroid Function Tests: No results for input(s): TSH, T4TOTAL, FREET4, T3FREE, THYROIDAB  in the last 72 hours. Anemia Panel: No results for input(s): VITAMINB12, FOLATE, FERRITIN, TIBC, IRON, RETICCTPCT in the last 72 hours. Sepsis Labs: No results for input(s): PROCALCITON, LATICACIDVEN in the last 168 hours.  Recent Results (from the past 240 hour(s))  Surgical pcr screen     Status: None   Collection Time: 10/02/18  7:31 AM   Specimen: Nasal Mucosa; Nasal Swab  Result Value Ref Range Status   MRSA, PCR NEGATIVE NEGATIVE Final   Staphylococcus aureus NEGATIVE NEGATIVE Final    Comment: (NOTE) The Xpert SA Assay (FDA approved for NASAL specimens in patients 4 years of age and older), is one component of a comprehensive surveillance program. It is not intended to diagnose infection nor to guide or monitor treatment. Performed at [facility], [place] [address] [place],  [address]   Culture, blood (routine x 2)     Status: None   Collection  Time: 10/02/18  7:19 PM   Specimen: BLOOD LEFT HAND  Result Value Ref Range Status   Specimen Description BLOOD LEFT HAND  Final   Special Requests   Final    BOTTLES DRAWN AEROBIC AND ANAEROBIC Blood Culture results may not be optimal due to an inadequate volume of blood received in culture bottles   Culture   Final    NO GROWTH 5 DAYS Performed at [facility], [place] [address] [place], [place] [address]    Report Status 10/07/2018 FINAL  Final  Culture, blood (routine x 2)     Status: None   Collection Time: 10/02/18  9:02 PM   Specimen: BLOOD LEFT HAND  Result Value Ref Range Status   Specimen Description BLOOD LEFT HAND  Final   Special Requests   Final    BOTTLES DRAWN AEROBIC AND ANAEROBIC Blood Culture adequate volume   Culture   Final    NO GROWTH 5 DAYS Performed at [facility], [place] [address] [place], [place] [address]    Report Status 10/07/2018 FINAL  Final         Radiology Studies: No results found.      Scheduled Meds: . amLODipine  10 mg Oral Daily  . atorvastatin  40  mg Oral QHS  . carvedilol  12.5 mg Oral BID  . Chlorhexidine Gluconate Cloth  6 each Topical Daily  . ferrous sulfate  325 mg Oral Q breakfast  . gabapentin  600 mg Oral TID  . insulin aspart  0-15 Units Subcutaneous TID WC  . insulin NPH Human  10 Units Subcutaneous BID AC & HS  . linezolid  600 mg Oral Q12H  . methadone  30 mg Oral Daily  . nicotine  21 mg Transdermal Daily  . polyethylene glycol  17 g Oral Daily  . rivaroxaban  15 mg Oral BID WC  . senna-docusate  2 tablet Oral BID   Continuous Infusions: . sodium chloride    . methocarbamol (ROBAXIN) IV 500 mg (10/10/18 0405)     LOS: 11 days    Time spent: 35 minutes.     Elmarie Shiley, MD [facility] Pager [phone]  If 7PM-7AM, please contact night-coverage [email or web address] Password [email or web address] 10/11/2018, 8:05 AM

## 2018-10-12 LAB — GLUCOSE, CAPILLARY
Glucose-Capillary: 84 mg/dL (ref 70–99)
Glucose-Capillary: 90 mg/dL (ref 70–99)
Glucose-Capillary: 102 mg/dL — ABNORMAL HIGH (ref 70–99)
Glucose-Capillary: 102 mg/dL — ABNORMAL HIGH (ref 70–99)
Glucose-Capillary: 132 mg/dL — ABNORMAL HIGH (ref 70–99)

## 2018-10-12 MED ORDER — POLYETHYLENE GLYCOL 3350 17 G PO PACK: 17.0000 g | PACK | Freq: Every day | ORAL | 0 refills | Status: AC

## 2018-10-12 MED ORDER — ATORVASTATIN CALCIUM 40 MG PO TABS: 40.0000 mg | ORAL_TABLET | Freq: Every day | ORAL | 0 refills | Status: AC

## 2018-10-12 MED ORDER — LOSARTAN POTASSIUM 25 MG PO TABS: 25.0000 mg | ORAL_TABLET | Freq: Every day | ORAL | 0 refills | Status: AC

## 2018-10-12 MED ORDER — AMLODIPINE BESYLATE 10 MG PO TABS: 10.0000 mg | ORAL_TABLET | Freq: Every day | ORAL | 0 refills | Status: AC

## 2018-10-12 MED ORDER — INSULIN NPH (HUMAN) (ISOPHANE) 100 UNIT/ML ~~LOC~~ SUSP: 10.0000 [IU] | Freq: Two times a day (BID) | SUBCUTANEOUS | 11 refills | Status: DC

## 2018-10-12 MED ORDER — INSULIN NPH (HUMAN) (ISOPHANE) 100 UNIT/ML ~~LOC~~ SUSP
2.0000 [IU] | Freq: Two times a day (BID) | SUBCUTANEOUS | Status: DC
  Administered 2018-10-12 – 2018-10-14 (×4): 2 [IU] via SUBCUTANEOUS
  Filled 2018-10-12: qty 10

## 2018-10-12 MED ORDER — SENNOSIDES-DOCUSATE SODIUM 8.6-50 MG PO TABS: 2.0000 | ORAL_TABLET | Freq: Two times a day (BID) | ORAL | 0 refills | Status: AC

## 2018-10-12 MED ORDER — LINEZOLID 600 MG PO TABS: 600.0000 mg | ORAL_TABLET | Freq: Two times a day (BID) | ORAL | 0 refills | Status: AC

## 2018-10-12 MED ORDER — CARVEDILOL 12.5 MG PO TABS: 12.5000 mg | ORAL_TABLET | Freq: Two times a day (BID) | ORAL | 0 refills | Status: AC

## 2018-10-12 MED ORDER — INSULIN NPH (HUMAN) (ISOPHANE) 100 UNIT/ML ~~LOC~~ SUSP: 2.0000 [IU] | Freq: Two times a day (BID) | SUBCUTANEOUS | 11 refills | Status: AC

## 2018-10-12 MED ORDER — FERROUS SULFATE 325 (65 FE) MG PO TABS: 325.0000 mg | ORAL_TABLET | Freq: Every day | ORAL | 3 refills | Status: AC

## 2018-10-12 MED ORDER — GABAPENTIN 300 MG PO CAPS: 600.0000 mg | ORAL_CAPSULE | Freq: Three times a day (TID) | ORAL | 0 refills | Status: AC

## 2018-10-12 MED ORDER — RIVAROXABAN (XARELTO) VTE STARTER PACK (15 & 20 MG): ORAL_TABLET | ORAL | 0 refills | Status: AC

## 2018-10-12 MED FILL — LOSARTAN POTASSIUM 25 MG TA: 25 | 30 days supply | Qty: 30 | Fill #0

## 2018-10-12 MED FILL — CARVEDILOL 12.5 MG TABLET: 12.5 | 30 days supply | Qty: 60 | Fill #0

## 2018-10-12 MED FILL — ATORVASTATIN CALCIUM 40 MG: 40 | 30 days supply | Qty: 30 | Fill #0

## 2018-10-12 MED FILL — AMLODIPINE BESYLATE 10 MG T: 10 | 30 days supply | Qty: 30 | Fill #0

## 2018-10-12 MED FILL — GABAPENTIN 300 MG CAPSULE: 300 | 30 days supply | Qty: 90 | Fill #0

## 2018-10-12 MED FILL — POLYETHYLENE GLYCOL 3350 PO: 17 | 14 days supply | Qty: 238 | Fill #0

## 2018-10-12 MED FILL — SENNA S 8.6-50 MG TABS: 8.6-50 | 8 days supply | Qty: 30 | Fill #0

## 2018-10-12 MED FILL — HumuLIN N 100 UNIT/ML SUSP: 100 | 28 days supply | Qty: 10 | Fill #0

## 2018-10-12 MED FILL — XARELTO STARTER PACK: 15 & 20 | 30 days supply | Qty: 51 | Fill #0

## 2018-10-12 MED FILL — FERROUS SULFATE 325 MG TAB: 325 (65 FE) | 30 days supply | Qty: 30 | Fill #0

## 2018-10-12 NOTE — TOC Progression Note (Signed)
Transition of Care [facility]) - Progression Note    Patient Details  Name: Daniel Moon MRN: [id number] Date of Birth: [birth year]  Transition of Care [facility]) CM/SW Contact  Jacalyn Lefevre Edson Snowball, RN Phone Number: 10/12/2018, 10:46 AM  Clinical Narrative:     Plan to discharge Sunday. DME at bedside. Scripts sent to [facility] and medications in main pharmacy , will need to be picked up on day of discharge and given to patient.   Expected Discharge Plan: [place] Barriers to Discharge: Continued Medical Work up  Expected Discharge Plan and Services Expected Discharge Plan: [place]   Discharge Planning Services: CM Consult Post Acute Care Choice: [place] arrangements for the past 2 months: Single Family Home                 DME Arranged: 3-N-1, Walker rolling, Tub bench, Wheelchair manual DME Agency: [employer] Date DME Agency Contacted: 10/09/18 Time DME Agency Contacted: 51 Representative spoke with at DME Agency: [facility] HH Arranged: PT, OT, RN           Social Determinants of Health ([place]) Interventions    Readmission Risk Interventions No flowsheet data found.

## 2018-10-12 NOTE — Progress Notes (Signed)
PT Cancellation Note  Patient Details Name: Daniel Moon MRN: [id number] DOB: October 14, 1960   Cancelled Treatment:    Reason Eval/Treat Not Completed: Patient declined, no reason specified. Pt refusing to participate in PT Treatment session today (his participation with PT/OT has been inconsistent throughout admission). Pt requesting to be removed from caseload, not wanting to work with [place] anymore. Noted all DME delivered to room. Pt has no further questions or concerns. Will sign off. Please reconsult if new needs arise.  Mabeline Caras, PT, DPT Acute Rehabilitation Services  Pager [phone] Office [place] 10/12/2018, 12:58 PM

## 2018-10-12 NOTE — Plan of Care (Signed)
  Problem: Education: Goal: Knowledge of General Education information will improve Description: Including pain rating scale, medication(s)/side effects and non-pharmacologic comfort measures Outcome: Progressing   Problem: Health Behavior/Discharge Planning: Goal: Ability to manage health-related needs will improve Outcome: Progressing   Problem: Clinical Measurements: Goal: Respiratory complications will improve Outcome: Progressing Goal: Cardiovascular complication will be avoided Outcome: Progressing   Problem: Coping: Goal: Level of anxiety will decrease Outcome: Progressing   Problem: Elimination: Goal: Will not experience complications related to bowel motility Outcome: Progressing Goal: Will not experience complications related to urinary retention Outcome: Progressing   Problem: Pain Managment: Goal: General experience of comfort will improve Outcome: Progressing   Problem: Safety: Goal: Ability to remain free from injury will improve Outcome: Progressing   Problem: Skin Integrity: Goal: Risk for impaired skin integrity will decrease Outcome: Progressing

## 2018-10-12 NOTE — Progress Notes (Signed)
PROGRESS NOTE    Daniel Moon  [id number] DOB: [birth year] DOA: [id number] PCP: Sanjuana Letters, MD   Brief Narrative: 58 year old with past medical history significant for heroin abuse, diabetes type 2, peripheral neuropathy, PVD status post balloon angioplasty of the left tibial artery and PCI of the distal SFA, history of left transmetatarsal amputation presented with sepsis secondary to TMA stump ulceration osteomyelitis and MRSA bacteremia.  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  Upon further evaluation he was also found to have left ventricular thrombus.   Assessment & Plan:   Principal Problem:   MRSA bacteremia Active Problems:   HTN (hypertension)   Diabetes ([place])   HLD (hyperlipidemia)   COPD (chronic obstructive pulmonary disease) (HCC)   Sepsis ([place])   Open wound of left foot   Osteomyelitis (HCC)   Microcytic anemia   PAD (peripheral artery disease) (HCC)   Peripheral neuropathy   Cigarette smoker   S/P transmetatarsal amputation of foot, left (HCC)   Pressure injury of skin   Chronic renal insufficiency, stage 3 (moderate) (HCC)   Paroxysmal atrial fibrillation (HCC)   Unintentional weight loss   Poor dentition   Drug-seeking behavior   1-sepsis secondary to left TMA stump ulceration with underlying osteomyelitis and MRSA bacteremia status 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 Previously on IV vancomycin has been transitioned to Zyvox for additional 2 weeks from 9/22 TEE without any vegetation.   Peripheral arterial disease with ulceration and osteomyelitis involving the left TMA stump:  Status post BKA on 9/15 Patient refused CIR or scale. Working on home health.  Systolic heart failure: Ejection fraction 35% continue with Coreg  Left ventricular thrombus: Seen on TEE.  He was previously on IV heparin and transition to Xarelto.  AKI on chronic kidney disease a stage II  III; Stable  Hypertension: Continue with amlodipine and Coreg  Dyslipidemia continue Lipitor  Peripheral neuropathy: Continue with gabapentin  Anemia; likely related to chronic disease  Heroin abuse drug-seeking behavior: Apparently he does heroin daily for 6 months.  He was a started on methadone.  We are arranging follow-up at the methadone clinic starting on Monday.  He will remain in the hospital until Sunday   Estimated body mass index is 20.47 kg/m as calculated from the following:   Height as of this encounter: 6\' 3"  (1.905 m).   Weight as of this encounter: 74.3 kg.   DVT prophylaxis: Xarelto Code Status: Full code Family Communication: Care discussed with patient  disposition Plan: Remain in the hospital, sooner that we were able to set him up with a methadone clinic was on Monday Consultants:   Ortho  Procedures:   ED  Antimicrobials:  Zyvox  Subjective: Alert and oriented, no new complaints.  Denies abdominal pain.  He is willing to stay until Sunday  Objective: Vitals:   10/11/18 1711 10/11/18 2121 10/12/18 0612 10/12/18 1240  BP: 132/81 128/80 126/89 113/84  Pulse: 79 84 81 73  Resp: 15 17 16 15   Temp: 98.4 F (36.9 C) 98.5 F (36.9 C) 98.5 F (36.9 C) 98.5 F (36.9 C)  TempSrc: Oral Oral Oral Oral  SpO2: 96% 97% 95% 96%  Weight:      Height:        Intake/Output Summary (Last 24 hours) at 10/12/2018 1445 Last data filed at 10/12/2018 [id number] Gross per 24 hour  Intake 120 ml  Output 0 ml  Net 120 ml   [employer]  10/10/18 0500 10/11/18 0355 10/11/18 0705  Weight: 82.3 kg 75.6 kg 74.3 kg    Examination:  General exam: No acute distress Respiratory system: Clear to auscultation Cardiovascular system: S1-S2 regular rhythm and rate Gastrointestinal system: Bowel sounds present, soft nontender nondistended Central nervous system: Alert and oriented Skin: No rashes Extremities: Left BKA with clean dressing  Data Reviewed: I have  personally reviewed following labs and imaging studies  CBC: Recent Labs  Lab 10/06/18 0925 10/07/18 0359 10/08/18 0228 10/09/18 0512  WBC 11.3* 10.8* 9.5 9.7  HGB 8.2* 8.3* 8.6* 9.9*  HCT 26.8* 27.0* 27.7* 31.7*  MCV 74.0* 73.8* 73.5* 72.9*  PLT 417* 454* 419* 123XX123*   Basic Metabolic Panel: Recent Labs  Lab 10/06/18 0925 10/07/18 0359 10/08/18 0228 10/09/18 0512 10/11/18 0335  NA 135 136 134* 133* 133*  K 4.2 4.2 3.9 4.2 4.2  CL 106 104 103 103 99  CO2 19* 23 21* 23 24  GLUCOSE 134* 120* 133* 109* 80  BUN 15 14 11 10 9   CREATININE 1.40* 1.40* 1.27* 1.23 1.31*  CALCIUM 8.0* 8.0* 8.1* 8.4* 8.5*  MG 1.8 1.8 1.7 1.7  --   PHOS 3.2  --   --   --   --    GFR: Estimated Creatinine Clearance: 65.4 mL/min (A) (by C-G formula based on SCr of 1.31 mg/dL (H)). Liver Function Tests: No results for input(s): AST, ALT, ALKPHOS, BILITOT, PROT, ALBUMIN in the last 168 hours. No results for input(s): LIPASE, AMYLASE in the last 168 hours. No results for input(s): AMMONIA in the last 168 hours. Coagulation Profile: No results for input(s): INR, PROTIME in the last 168 hours. Cardiac Enzymes: No results for input(s): CKTOTAL, CKMB, CKMBINDEX, TROPONINI in the last 168 hours. BNP (last 3 results) No results for input(s): PROBNP in the last 8760 hours. HbA1C: No results for input(s): HGBA1C in the last 72 hours. CBG: Recent Labs  Lab 10/11/18 1709 10/11/18 2119 10/12/18 0859 10/12/18 1209 10/12/18 1239  GLUCAP 113* 126* 84 90 102*   Lipid Profile: No results for input(s): CHOL, HDL, LDLCALC, TRIG, CHOLHDL, LDLDIRECT in the last 72 hours. Thyroid Function Tests: No results for input(s): TSH, T4TOTAL, FREET4, T3FREE, THYROIDAB in the last 72 hours. Anemia Panel: No results for input(s): VITAMINB12, FOLATE, FERRITIN, TIBC, IRON, RETICCTPCT in the last 72 hours. Sepsis Labs: No results for input(s): PROCALCITON, LATICACIDVEN in the last 168 hours.  Recent Results (from the  past 240 hour(s))  Culture, blood (routine x 2)     Status: None   Collection Time: 10/02/18  7:19 PM   Specimen: BLOOD LEFT HAND  Result Value Ref Range Status   Specimen Description BLOOD LEFT HAND  Final   Special Requests   Final    BOTTLES DRAWN AEROBIC AND ANAEROBIC Blood Culture results may not be optimal due to an inadequate volume of blood received in culture bottles   Culture   Final    NO GROWTH 5 DAYS Performed at [facility], [place] [address], [place], [place] [address]    Report Status 10/07/2018 FINAL  Final  Culture, blood (routine x 2)     Status: None   Collection Time: 10/02/18  9:02 PM   Specimen: BLOOD LEFT HAND  Result Value Ref Range Status   Specimen Description BLOOD LEFT HAND  Final   Special Requests   Final    BOTTLES DRAWN AEROBIC AND ANAEROBIC Blood Culture adequate volume   Culture   Final  NO GROWTH 5 DAYS Performed at [facility], [place] [address], [place], [place] [address]    Report Status 10/07/2018 FINAL  Final         Radiology Studies: No results found.      Scheduled Meds:  amLODipine  10 mg Oral Daily   atorvastatin  40 mg Oral QHS   carvedilol  12.5 mg Oral BID   Chlorhexidine Gluconate Cloth  6 each Topical Daily   ferrous sulfate  325 mg Oral Q breakfast   gabapentin  600 mg Oral TID   insulin aspart  0-15 Units Subcutaneous TID WC   insulin NPH Human  2 Units Subcutaneous BID AC & HS   linezolid  600 mg Oral Q12H   methadone  30 mg Oral Daily   nicotine  21 mg Transdermal Daily   polyethylene glycol  17 g Oral Daily   rivaroxaban  15 mg Oral BID WC   senna-docusate  2 tablet Oral BID   Continuous Infusions:  sodium chloride     methocarbamol (ROBAXIN) IV 500 mg (10/10/18 0405)     LOS: 12 days    Time spent: 35 minutes.     Elmarie Shiley, MD [facility] Pager [phone]  If 7PM-7AM, please contact night-coverage [email or web address] Password [facility] 10/12/2018, 2:45 PM

## 2018-10-12 NOTE — Plan of Care (Signed)
  Problem: Activity: Goal: Risk for activity intolerance will decrease Outcome: Progressing   Problem: Nutrition: Goal: Adequate nutrition will be maintained Outcome: Progressing   Problem: Pain Managment: Goal: General experience of comfort will improve Outcome: Progressing   Problem: Safety: Goal: Ability to remain free from injury will improve Outcome: Progressing   Problem: Skin Integrity: Goal: Risk for impaired skin integrity will decrease Outcome: Progressing   

## 2018-10-12 NOTE — Progress Notes (Signed)
Dressing changed as ordered. Pt tolerated well. Will continue to monitor.

## 2018-10-12 NOTE — Progress Notes (Signed)
Physical Therapy Discharge Patient Details Name: Jace Freier MRN: [id number] DOB: [birth year] Today's Date: 10/12/2018 Time:  -     Patient discharged from PT services secondary to patient refusing participation and requesting to be removed from acute PT caseload without medical reason.  Please see latest therapy progress note for current level of functioning and progress toward goals.    Progress and discharge plan discussed with patient and/or caregiver: Patient/Caregiver agrees with plan  GP    Mabeline Caras, PT, DPT Acute Rehabilitation Services  Pager [phone] Office [place] 10/12/2018, 1:00 PM

## 2018-10-13 LAB — GLUCOSE, CAPILLARY
Glucose-Capillary: 86 mg/dL (ref 70–99)
Glucose-Capillary: 100 mg/dL — ABNORMAL HIGH (ref 70–99)
Glucose-Capillary: 150 mg/dL — ABNORMAL HIGH (ref 70–99)
Glucose-Capillary: 111 mg/dL — ABNORMAL HIGH (ref 70–99)

## 2018-10-13 MED ORDER — LISINOPRIL 5 MG PO TABS: 5.0000 mg | ORAL_TABLET | Freq: Every day | ORAL | Status: DC

## 2018-10-13 NOTE — Progress Notes (Addendum)
PROGRESS NOTE    Daniel Moon  [id number] DOB: 08-Oct-1960 DOA: [id number] PCP: Sanjuana Letters, MD   Brief Narrative: 58 year old with past medical history significant for heroin abuse, diabetes type 2, peripheral neuropathy, PVD status post balloon angioplasty of the left tibial artery and PCI of the distal SFA, history of left transmetatarsal amputation presented with sepsis secondary to TMA stump ulceration osteomyelitis and MRSA bacteremia.  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  Upon further evaluation he was also found to have left ventricular thrombus.   Assessment & Plan:   Principal Problem:   MRSA bacteremia Active Problems:   HTN (hypertension)   Diabetes ([place])   HLD (hyperlipidemia)   COPD (chronic obstructive pulmonary disease) (HCC)   Sepsis ([place])   Open wound of left foot   Osteomyelitis (HCC)   Microcytic anemia   PAD (peripheral artery disease) (HCC)   Peripheral neuropathy   Cigarette smoker   S/P transmetatarsal amputation of foot, left (HCC)   Pressure injury of skin   Chronic renal insufficiency, stage 3 (moderate) (HCC)   Paroxysmal atrial fibrillation (HCC)   Unintentional weight loss   Poor dentition   Drug-seeking behavior   1-Sepsis secondary to left TMA stump ulceration with underlying osteomyelitis and MRSA bacteremia status 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 Previously on IV vancomycin has been transitioned to Zyvox for additional 2 weeks from 9/22 TEE without any vegetation.   Peripheral arterial disease with ulceration and osteomyelitis involving the left TMA stump:  Status post BKA on 9/15 Patient refused CIR or scale. Working on setting  home health.  Systolic heart failure: Ejection fraction 35% continue with Coreg Resume cozaar at discharge  Left ventricular thrombus: Seen on TEE.  He was previously on IV heparin and transition to Xarelto.  AKI on chronic kidney  disease a stage II III; Stable  Hypertension: Continue with amlodipine and Coreg  Dyslipidemia continue Lipitor  Peripheral neuropathy: Continue with gabapentin  Anemia; likely related to chronic disease  Heroin abuse drug-seeking behavior: Apparently he does heroin daily for 6 months.  He was a started on methadone.  We are arranging follow-up at the methadone clinic starting on Monday.  He will remain in the hospital until Sunday   Estimated body mass index is 20.89 kg/m as calculated from the following:   Height as of this encounter: 6\' 3"  (1.905 m).   Weight as of this encounter: 75.8 kg.   DVT prophylaxis: Xarelto Code Status: Full code Family Communication: Care discussed with patient  disposition Plan: Remain in the hospital, sooner that we were able to set him up with a methadone clinic was on Monday Consultants:   Ortho  Procedures:   ED  Antimicrobials:  Zyvox  Subjective: Pain controlled. Wants to go home  Objective: Vitals:   10/12/18 1240 10/12/18 2043 10/13/18 0605 10/13/18 1011  BP: 113/84 122/83 124/87 120/81  Pulse: 73 83 79 82  Resp: 15 16 15    Temp: 98.5 F (36.9 C) 98.3 F (36.8 C) 98 F (36.7 C)   TempSrc: Oral Oral Oral   SpO2: 96% 96% 100%   Weight:   75.8 kg   Height:        Intake/Output Summary (Last 24 hours) at 10/13/2018 1321 Last data filed at 10/13/2018 0607 Gross per 24 hour  Intake 485 ml  Output 500 ml  Net -15 ml   [employer] We[employer]   10/11/18 0355 10/11/18 0705 10/13/18 [id number]  Weight: 75.6 kg 74.3 kg 75.8 kg    Examination:  General exam: NAD Respiratory system: CTA Cardiovascular system: S 1, S 2 RRR Gastrointestinal system: BS present, soft, nt Central nervous system: Alert and oriented Skin: No rashes Extremities: Left BKA with clean dressing  Data Reviewed: I have personally reviewed following labs and imaging studies  CBC: Recent Labs  Lab 10/07/18 0359 10/08/18 0228 10/09/18 0512  WBC 10.8* 9.5 9.7   HGB 8.3* 8.6* 9.9*  HCT 27.0* 27.7* 31.7*  MCV 73.8* 73.5* 72.9*  PLT 454* 419* 123XX123*   Basic Metabolic Panel: Recent Labs  Lab 10/07/18 0359 10/08/18 0228 10/09/18 0512 10/11/18 0335  NA 136 134* 133* 133*  K 4.2 3.9 4.2 4.2  CL 104 103 103 99  CO2 23 21* 23 24  GLUCOSE 120* 133* 109* 80  BUN 14 11 10 9   CREATININE 1.40* 1.27* 1.23 1.31*  CALCIUM 8.0* 8.1* 8.4* 8.5*  MG 1.8 1.7 1.7  --    GFR: Estimated Creatinine Clearance: 66.7 mL/min (A) (by C-G formula based on SCr of 1.31 mg/dL (H)). Liver Function Tests: No results for input(s): AST, ALT, ALKPHOS, BILITOT, PROT, ALBUMIN in the last 168 hours. No results for input(s): LIPASE, AMYLASE in the last 168 hours. No results for input(s): AMMONIA in the last 168 hours. Coagulation Profile: No results for input(s): INR, PROTIME in the last 168 hours. Cardiac Enzymes: No results for input(s): CKTOTAL, CKMB, CKMBINDEX, TROPONINI in the last 168 hours. BNP (last 3 results) No results for input(s): PROBNP in the last 8760 hours. HbA1C: No results for input(s): HGBA1C in the last 72 hours. CBG: Recent Labs  Lab 10/12/18 1239 10/12/18 1624 10/12/18 2041 10/13/18 0752 10/13/18 1201  GLUCAP 102* 102* 132* 86 100*   Lipid Profile: No results for input(s): CHOL, HDL, LDLCALC, TRIG, CHOLHDL, LDLDIRECT in the last 72 hours. Thyroid Function Tests: No results for input(s): TSH, T4TOTAL, FREET4, T3FREE, THYROIDAB in the last 72 hours. Anemia Panel: No results for input(s): VITAMINB12, FOLATE, FERRITIN, TIBC, IRON, RETICCTPCT in the last 72 hours. Sepsis Labs: No results for input(s): PROCALCITON, LATICACIDVEN in the last 168 hours.  No results found for this or any previous visit (from the past 240 hour(s)).       Radiology Studies: No results found.      Scheduled Meds: . amLODipine  10 mg Oral Daily  . atorvastatin  40 mg Oral QHS  . carvedilol  12.5 mg Oral BID  . Chlorhexidine Gluconate Cloth  6 each  Topical Daily  . ferrous sulfate  325 mg Oral Q breakfast  . gabapentin  600 mg Oral TID  . insulin aspart  0-15 Units Subcutaneous TID WC  . insulin NPH Human  2 Units Subcutaneous BID AC & HS  . linezolid  600 mg Oral Q12H  . methadone  30 mg Oral Daily  . nicotine  21 mg Transdermal Daily  . polyethylene glycol  17 g Oral Daily  . rivaroxaban  15 mg Oral BID WC  . senna-docusate  2 tablet Oral BID   Continuous Infusions: . sodium chloride    . methocarbamol (ROBAXIN) IV 500 mg (10/10/18 0405)     LOS: 13 days    Time spent: 35 minutes.     Elmarie Shiley, MD [facility] Pager [phone]  If 7PM-7AM, please contact night-coverage [email or web address] Password [email or web address] 10/13/2018, 1:21 PM

## 2018-10-14 LAB — GLUCOSE, CAPILLARY: Glucose-Capillary: 136 mg/dL — ABNORMAL HIGH (ref 70–99)

## 2018-10-14 NOTE — Discharge Summary (Signed)
Physician Discharge Summary  Daniel Moon [id number] DOB: 1960-02-22 DOA: 09/30/2018  PCP: Sanjuana Letters, MD  Admit date: 09/30/2018 Discharge date: 10/14/2018  Admitted From: Home  Disposition:  Home   Recommendations for Outpatient Follow-up:  1. Follow up with PCP in 1-2 weeks 2. Please obtain BMP/CBC in one week  Home Health: yes Equipment/Devices; wheelchair, bedside commode, walker.   Discharge Condition: Stable.  CODE STATUS: Full code Diet recommendation: Heart Healthy   Brief/Interim Summary: 58 year old with past medical history significant for heroin abuse, diabetes type 2, peripheral neuropathy, PVD status post balloon angioplasty of the left tibial artery and PCI of the distal SFA, history of left transmetatarsal amputation presented with sepsis secondary to TMA stump ulceration osteomyelitis and MRSA bacteremia.  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  Upon further evaluation he was also found to have left ventricular thrombus.  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 Previously on IV vancomycin has been transitioned to Zyvox for additional 2 weeks from 9/22 TEE without any vegetation. Discharge on Zyvox, medication will be provided to patient.    Peripheral arterial disease with ulceration and osteomyelitis involving the left TMA stump:  Status post BKA on 9/15 Patient refused CIR or scale. CM Working on setting  home health. Needs to follow up with Dr Scot Dock.   Systolic heart failure: Ejection fraction 35% continue with Coreg Resume cozaar at discharge Needs to follow up with cardiologist.   Left ventricular thrombus: Seen on TEE.  He was previously on IV heparin and transition to Xarelto. needs to follow up with cardiology.   AKI on chronic kidney disease a stage II  III; Stable  Hypertension: Continue with amlodipine and Coreg, resume cozaar.   Dyslipidemia continue Lipitor  Peripheral neuropathy: Continue with gabapentin  Anemia; likely related to chronic disease  Heroin abuse drug-seeking behavior: Apparently he does heroin daily for 6 months.  He was a started on methadone.  We are arranging follow-up at the methadone clinic starting on Monday.   Stable for discharge.   Discharge Diagnoses:  Principal Problem:   MRSA bacteremia Active Problems:   HTN (hypertension)   Diabetes ([place])   HLD (hyperlipidemia)   COPD (chronic obstructive pulmonary disease) (HCC)   Sepsis ([place])   Open wound of left foot   Osteomyelitis (HCC)   Microcytic anemia   PAD (peripheral artery disease) (HCC)   Peripheral neuropathy   Cigarette smoker   S/P transmetatarsal amputation of foot, left (HCC)   Pressure injury of skin   Chronic renal insufficiency, stage 3 (moderate) (HCC)   Paroxysmal atrial fibrillation (HCC)   Unintentional weight loss   Poor dentition   Drug-seeking behavior    Discharge Instructions  Discharge Instructions    Diet - low sodium heart healthy   Complete by: As directed    Increase activity slowly   Complete by: As directed      Allergies as of 10/14/2018      Reactions   Amoxicillin-pot Clavulanate Nausea And Vomiting   Did it involve swelling of the face/tongue/throat, SOB, or low BP? No Did it involve sudden or severe rash/hives, skin peeling, or any reaction on the inside of your mouth or nose? No Did you need to seek medical attention at a hospital or doctor's office? No When did it last happen?6 months ago If all above answers are "NO", may proceed  with cephalosporin use.      Medication List    STOP taking these medications   clopidogrel 75 MG tablet Commonly known as: PLAVIX   HumuLIN R 100 units/mL injection Generic drug: insulin regular   insulin NPH-regular Human (70-30) 100 UNIT/ML injection    mometasone-formoterol 200-5 MCG/ACT Aero Commonly known as: Dulera   mupirocin ointment 2 % Commonly known as: BACTROBAN     TAKE these medications   amLODipine 10 MG tablet Commonly known as: NORVASC Take 1 tablet (10 mg total) by mouth daily.   atorvastatin 40 MG tablet Commonly known as: LIPITOR Take 1 tablet (40 mg total) by mouth at bedtime.   carvedilol 12.5 MG tablet Commonly known as: COREG Take 1 tablet (12.5 mg total) by mouth 2 (two) times daily.   ferrous sulfate 325 (65 FE) MG tablet Take 1 tablet (325 mg total) by mouth daily with breakfast.   gabapentin 300 MG capsule Commonly known as: NEURONTIN Take 2 capsules (600 mg total) by mouth 3 (three) times daily. What changed:   how much to take  when to take this   insulin NPH Human 100 UNIT/ML injection Commonly known as: HumuLIN N Inject 0.02 mLs (2 Units total) into the skin 2 (two) times daily before a meal. What changed:   how much to take  when to take this   linezolid 600 MG tablet Commonly known as: ZYVOX Take 1 tablet (600 mg total) by mouth every 12 (twelve) hours for 9 days.   losartan 25 MG tablet Commonly known as: COZAAR Take 1 tablet (25 mg total) by mouth daily.   nicotine 21 mg/24hr patch Commonly known as: NICODERM CQ - dosed in mg/24 hours Place 1 patch (21 mg total) onto the skin daily.   polyethylene glycol 17 g packet Commonly known as: MIRALAX / GLYCOLAX Take 17 g by mouth daily.   Rivaroxaban 15 & 20 MG Tbpk Follow package directions: Take one 15mg  tablet by mouth twice a day. On day 22, switch to one 20mg  tablet once a day. Take with food.   senna-docusate 8.6-50 MG tablet Commonly known as: Senokot-S Take 2 tablets by mouth 2 (two) times daily.            Durable Medical Equipment  (From admission, onward)         Start     Ordered   10/09/18 1519  For home use only DME Walker rolling  Once    Question:  Patient needs a walker to treat with the  following condition  Answer:  Hx of BKA, left ([place])   10/09/18 1518   10/09/18 1507  For home use only DME 3 n 1  Once     10/09/18 1507   10/09/18 1507  For home use only DME Tub bench  Once    Comments: Tub transfer bench   10/09/18 1507   10/09/18 1505  For home use only DME lightweight manual wheelchair with seat cushion  Once    Comments: Patient suffers from  L BKA which impairs their ability to perform daily activities like ambulating  in the home.  A  cane will not resolve  issue with performing activities of daily living. A wheelchair will allow patient to safely perform daily activities. Patient is not able to propel themselves in the home using a standard weight wheelchair due to  L BKA. Patient can self propel in the lightweight wheelchair. Length of need lifetime  Accessories: elevating leg rests (ELRs),  wheel locks, extensions and anti-tippers.  Seat and back cushions   10/09/18 1506         Follow-up Information    [place], FNP Follow up.   Specialties: Family Medicine, Infectious Diseases Why: 10/6 at 11am. Please call to reschedule if you are not able to make this appointment.  Contact information: [place] [address] [phone]        [facility] Follow up.   Specialty: Home Health Services Contact information: [place] [address] [phone]        Angelia Mould, MD Follow up in 1 week(s).   Specialties: Vascular Surgery, Cardiology Contact information: [address] [place] [address] [phone]        Jerline Pain, MD Follow up in 3 week(s).   Specialty: Cardiology Why: please call office schedule appointment.,  Contact information: [address] [place] [address] [phone]          Allergies  Allergen Reactions  . Amoxicillin-Pot Clavulanate Nausea And Vomiting    Did it involve swelling of the  face/tongue/throat, SOB, or low BP? No Did it involve sudden or severe rash/hives, skin peeling, or any reaction on the inside of your mouth or nose? No Did you need to seek medical attention at a hospital or doctor's office? No When did it last happen?6 months ago If all above answers are "NO", may proceed with cephalosporin use.    Consultations:  Vascular  ID  Cardiology    Procedures/Studies: Dg Chest Port 1 View  Result Date: 09/30/2018 CLINICAL DATA:  Suspected sepsis. EXAM: PORTABLE CHEST 1 VIEW COMPARISON:  Radiograph 07/29/2018, 02/22/2018 FINDINGS: Improved aeration from prior exams. Mild cardiomegaly with unchanged mediastinal contours. No acute airspace disease. No pulmonary edema, pleural fluid, or pneumothorax. No acute osseous abnormalities. IMPRESSION: Mild cardiomegaly without acute abnormality. Electronically Signed   By: Keith Rake M.D.   On: 09/30/2018 01:28   Dg Foot Complete Left  Result Date: 09/30/2018 CLINICAL DATA:  Left foot pain. History of partial amputation. EXAM: LEFT FOOT - COMPLETE 3+ VIEW COMPARISON:  Radiograph 07/16/2018 FINDINGS: Prior transmetatarsal amputation. Soft tissue defect about the plantar lateral aspect of the foot. Osseous resorption with bony destructive change involving the in situ second through fifth metatarsals, progressed from prior exam. Interval bony destruction involving the cuboid with fragmentation laterally. Nonspecific decreased density involving the anterior cortex of the calcaneus. Generalized soft tissue edema. 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 Soft tissue defect overlies the plantar lateral aspect of the foot. Generalized soft tissue edema. Electronically Signed   By: Keith Rake M.D.   On: 09/30/2018 01:31    Subjective: Feeling well, denies pain   Discharge Exam: Vitals:   10/13/18 2007 10/14/18 0520  BP:  110/67 110/75  Pulse: 81 79  Resp: 17 15  Temp: 97.7 F (36.5 C) 98.2 F (36.8 C)  SpO2: 97% 96%     General: Pt is alert, awake, not in acute distress Cardiovascular: RRR, S1/S2 +, no rubs, no gallops Respiratory: CTA bilaterally, no wheezing, no rhonchi Abdominal: Soft, NT, ND, bowel sounds + Extremities: no edema, no cyanosis    The results of significant diagnostics from this hospitalization (including imaging, microbiology, ancillary and laboratory) are listed below for reference.     Microbiology: No results found for this or any previous  visit (from the past 240 hour(s)).   Labs: BNP (last 3 results) Recent Labs    02/22/18 2201  BNP [id number]*   Basic Metabolic Panel: Recent Labs  Lab 10/08/18 0228 10/09/18 0512 10/11/18 0335  NA 134* 133* 133*  K 3.9 4.2 4.2  CL 103 103 99  CO2 21* 23 24  GLUCOSE 133* 109* 80  BUN 11 10 9   CREATININE 1.27* 1.23 1.31*  CALCIUM 8.1* 8.4* 8.5*  MG 1.7 1.7  --    Liver Function Tests: No results for input(s): AST, ALT, ALKPHOS, BILITOT, PROT, ALBUMIN in the last 168 hours. No results for input(s): LIPASE, AMYLASE in the last 168 hours. No results for input(s): AMMONIA in the last 168 hours. CBC: Recent Labs  Lab 10/08/18 0228 10/09/18 0512  WBC 9.5 9.7  HGB 8.6* 9.9*  HCT 27.7* 31.7*  MCV 73.5* 72.9*  PLT 419* 474*   Cardiac Enzymes: No results for input(s): CKTOTAL, CKMB, CKMBINDEX, TROPONINI in the last 168 hours. BNP: Invalid input(s): POCBNP CBG: Recent Labs  Lab 10/13/18 0752 10/13/18 1201 10/13/18 1634 10/13/18 2126 10/14/18 0803  GLUCAP 86 100* 150* 111* 136*   D-Dimer No results for input(s): DDIMER in the last 72 hours. Hgb A1c No results for input(s): HGBA1C in the last 72 hours. Lipid Profile No results for input(s): CHOL, HDL, LDLCALC, TRIG, CHOLHDL, LDLDIRECT in the last 72 hours. Thyroid function studies No results for input(s): TSH, T4TOTAL, T3FREE, THYROIDAB in the last 72  hours.  Invalid input(s): FREET3 Anemia work up No results for input(s): VITAMINB12, FOLATE, FERRITIN, TIBC, IRON, RETICCTPCT in the last 72 hours. Urinalysis    Component Value Date/Time   COLORURINE AMBER (A) 09/30/2018 0051   APPEARANCEUR CLOUDY (A) 09/30/2018 0051   LABSPEC 1.024 09/30/2018 0051   PHURINE 5.0 09/30/2018 0051   GLUCOSEU NEGATIVE 09/30/2018 0051   HGBUR NEGATIVE 09/30/2018 0051   BILIRUBINUR NEGATIVE 09/30/2018 0051   KETONESUR NEGATIVE 09/30/2018 0051   PROTEINUR 100 (A) 09/30/2018 0051   NITRITE NEGATIVE 09/30/2018 0051   LEUKOCYTESUR NEGATIVE 09/30/2018 0051   Sepsis Labs Invalid input(s): PROCALCITONIN,  WBC,  LACTICIDVEN Microbiology No results found for this or any previous visit (from the past 240 hour(s)).   Time coordinating discharge: 40 minutes  SIGNED:   Elmarie Shiley, MD  [facility]

## 2018-10-14 NOTE — TOC Transition Note (Addendum)
Transition of Care [facility]) - CM/SW Discharge Note   Patient Details  Name: Daniel Moon MRN: [id number] Date of Birth: August 02, 1960  Transition of [facility]) CM/SW Contact:  Claudie Leach, RN Phone Number: [phone] 10/14/2018, 9:24 AM   Clinical Narrative:    Patient to d/c to [facility] health (PT/OT), Alcohol/Drug Services, and follow up with [facility] ([facility]).    Attempted to find dressing change supplies for patient since he is not getting home health RN services.  [facility] may be able to provide some supplies, but not all.  Patient referred to [employer] agency that may be able to provide some supplies.  Pharmacies cannot bill Medicaid for dressing supplies.  Patient will need to pay for own supplies unless other arrangements can be made.    Alvis Lemmings will contact patient to start therapy.   Patient given contact information for [facility]  He will need to make appointment.  They may also be able to help with dressing supplies and will help with meds.  [facility] pharmacy filled scripts Friday.    Barriers to Discharge: Continued Medical Work up   Patient Goals and CMS Choice Patient states their goals for this hospitalization and ongoing recovery are:: to go home CMS Medicare.gov Compare Post Acute Care list provided to:: Patient Choice offered to / list presented to : Patient  Discharge Placement                       Discharge Plan and Services   Discharge Planning Services: CM Consult Post Acute Care Choice: Home Health          DME Arranged: 3-N-1, Walker rolling, Tub bench, Wheelchair manual DME Agency: [employer] Date DME Agency Contacted: 10/09/18 Time DME Agency Contacted: [phone] Representative spoke with at DME Agency: [place]: PT, OT, RN          Social Determinants of Health ([place]) Interventions     Readmission Risk Interventions No flowsheet data found.

## 2018-10-14 NOTE — Progress Notes (Signed)
Patient discharged to home. Verbalized understanding of all discharge instructions including AKA incision care, resource information to obtain supplies, home health services, all discharge medications given from [facility] and follow up MD visits/appointments to be scheduled.

## 2018-10-23 ENCOUNTER — Inpatient Hospital Stay: Payer: Medicaid Other | Admitting: Family

## 2018-10-24 NOTE — Care Management (Signed)
Received a message from AD of [facility] . Patient's mother Regino Schultze called [phone], concerned that [facility] has not called to schedule an appointment.   NCM spoke with [place] with [facility]. [facility] has called patient three times at [phone] and left two messages. [facility] has not received a call back. Also patient did not follow up with PCP for labs.   Prior to discharge this NCM confirmed that number with patient and girlfriend.   Called Gladys back at [phone] and no answer and no voicemail.    Magdalen Spatz RN BSN [phone]

## 2018-11-14 ENCOUNTER — Other Ambulatory Visit: Payer: Self-pay

## 2018-11-14 ENCOUNTER — Ambulatory Visit (INDEPENDENT_AMBULATORY_CARE_PROVIDER_SITE_OTHER): Payer: Self-pay | Admitting: Physician Assistant

## 2018-11-14 ENCOUNTER — Encounter: Payer: Self-pay | Admitting: Physician Assistant

## 2018-11-14 DIAGNOSIS — N186 End stage renal disease: Secondary | ICD-10-CM

## 2018-11-14 DIAGNOSIS — Z992 Dependence on renal dialysis: Secondary | ICD-10-CM

## 2018-11-14 NOTE — Progress Notes (Signed)
  POST OPERATIVE OFFICE NOTE    CC:  F/u for surgery  HPI:  This is a 58 y.o. male who is s/p left BKA  Allergies  Allergen Reactions  . Amoxicillin-Pot Clavulanate Nausea And Vomiting    Did it involve swelling of the face/tongue/throat, SOB, or low BP? No Did it involve sudden or severe rash/hives, skin peeling, or any reaction on the inside of your mouth or nose? No Did you need to seek medical attention at a hospital or doctor's office? No When did it last happen?6 months ago If all above answers are "NO", may proceed with cephalosporin use.    Current Outpatient Medications  Medication Sig Dispense Refill  . amLODipine (NORVASC) 10 MG tablet Take 1 tablet (10 mg total) by mouth daily. 30 tablet 0  . atorvastatin (LIPITOR) 40 MG tablet Take 1 tablet (40 mg total) by mouth at bedtime. 30 tablet 0  . carvedilol (COREG) 12.5 MG tablet Take 1 tablet (12.5 mg total) by mouth 2 (two) times daily. 60 tablet 0  . ferrous sulfate 325 (65 FE) MG tablet Take 1 tablet (325 mg total) by mouth daily with breakfast. 30 tablet 3  . gabapentin (NEURONTIN) 300 MG capsule Take 2 capsules (600 mg total) by mouth 3 (three) times daily. 90 capsule 0  . insulin NPH Human (HUMULIN N) 100 UNIT/ML injection Inject 0.02 mLs (2 Units total) into the skin 2 (two) times daily before a meal. 10 mL 11  . losartan (COZAAR) 25 MG tablet Take 1 tablet (25 mg total) by mouth daily. 30 tablet 0  . nicotine (NICODERM CQ - DOSED IN MG/24 HOURS) 21 mg/24hr patch Place 1 patch (21 mg total) onto the skin daily. (Patient not taking: Reported on 09/30/2018) 30 patch 1  . polyethylene glycol (MIRALAX / GLYCOLAX) 17 g packet Take 17 g by mouth daily. 14 each 0  . Rivaroxaban 15 & 20 MG TBPK Follow package directions: Take one 15mg  tablet by mouth twice a day. On day 22, switch to one 20mg  tablet once a day. Take with food. 51 each 0  . senna-docusate (SENOKOT-S) 8.6-50 MG tablet Take 2 tablets by mouth 2 (two) times daily. 30  tablet 0   No current facility-administered medications for this visit.      ROS:  See HPI  Physical Exam:    Incision:  Well healed Extremities:  Left BKA incision is well healed, He lacks 10 degrees of left knee ext.   Assessment/Plan:  This is a 58 y.o. male who is s/p: Left BKA with left knee contract.  I gave him exercises to do at home to help with knee ext.  I explained he needs to have full knee ext to use a prothesis.  He was give an order for [employer] he will call for an appointment.  F/U PRN    Roxy Horseman PA-C [facility] [phone]  Clinic MD:  Scot Dock

## 2019-01-04 ENCOUNTER — Encounter (HOSPITAL_COMMUNITY): Payer: Self-pay | Admitting: Emergency Medicine

## 2019-01-04 ENCOUNTER — Emergency Department (HOSPITAL_COMMUNITY): Payer: Medicaid Other

## 2019-01-04 ENCOUNTER — Other Ambulatory Visit: Payer: Self-pay

## 2019-01-04 ENCOUNTER — Emergency Department (HOSPITAL_COMMUNITY)
Admission: EM | Admit: 2019-01-04 | Discharge: 2019-01-04 | Disposition: A | Discharge status: Home / Self Care | Payer: Medicaid Other | Attending: Emergency Medicine | Admitting: Emergency Medicine

## 2019-01-04 DIAGNOSIS — U071 COVID-19: Secondary | ICD-10-CM

## 2019-01-04 DIAGNOSIS — I252 Old myocardial infarction: Secondary | ICD-10-CM | POA: Diagnosis not present

## 2019-01-04 DIAGNOSIS — I10 Essential (primary) hypertension: Secondary | ICD-10-CM | POA: Diagnosis not present

## 2019-01-04 DIAGNOSIS — I5022 Chronic systolic (congestive) heart failure: Secondary | ICD-10-CM | POA: Diagnosis not present

## 2019-01-04 DIAGNOSIS — Z89512 Acquired absence of left leg below knee: Secondary | ICD-10-CM | POA: Diagnosis not present

## 2019-01-04 DIAGNOSIS — M79652 Pain in left thigh: Secondary | ICD-10-CM | POA: Insufficient documentation

## 2019-01-04 DIAGNOSIS — Z794 Long term (current) use of insulin: Secondary | ICD-10-CM | POA: Diagnosis not present

## 2019-01-04 DIAGNOSIS — I739 Peripheral vascular disease, unspecified: Secondary | ICD-10-CM | POA: Insufficient documentation

## 2019-01-04 DIAGNOSIS — E119 Type 2 diabetes mellitus without complications: Secondary | ICD-10-CM | POA: Diagnosis not present

## 2019-01-04 DIAGNOSIS — B182 Chronic viral hepatitis C: Secondary | ICD-10-CM | POA: Diagnosis not present

## 2019-01-04 DIAGNOSIS — Z79899 Other long term (current) drug therapy: Secondary | ICD-10-CM | POA: Diagnosis not present

## 2019-01-04 DIAGNOSIS — R4182 Altered mental status, unspecified: Secondary | ICD-10-CM | POA: Diagnosis not present

## 2019-01-04 DIAGNOSIS — F17210 Nicotine dependence, cigarettes, uncomplicated: Secondary | ICD-10-CM | POA: Diagnosis not present

## 2019-01-04 DIAGNOSIS — R0602 Shortness of breath: Secondary | ICD-10-CM | POA: Diagnosis present

## 2019-01-04 LAB — CBG MONITORING, ED: Glucose-Capillary: 110 mg/dL — ABNORMAL HIGH (ref 70–99)

## 2019-01-04 LAB — POC SARS CORONAVIRUS 2 AG -  ED: SARS Coronavirus 2 Ag: POSITIVE — AB

## 2019-01-04 NOTE — ED Notes (Signed)
Pt requesting to leave, states his ride "[employer]" is here to take him home.

## 2019-01-04 NOTE — ED Provider Notes (Signed)
[facility] EMERGENCY DEPARTMENT Provider Note   CSN: [id number] Arrival date & time: 01/04/19  1059     History Chief Complaint  Patient presents with  . Shortness of Breath    Daniel Moon is a 58 y.o. male.  HPI     The patient presented for evaluation of shortness of breath and pain in his left leg, site of amputation.  He has a left leg BKA.  Patient is unable to give history.  Level 5 caveat-sleepy, lethargic  Past Medical History:  Diagnosis Date  . Diabetes mellitus without complication ([place])   . Heart attack ([place])   . Hepatitis C   . HLD (hyperlipidemia)   . HTN (hypertension)   . Peripheral vascular disease ([place])   . Systolic CHF [facility])     Patient Active Problem List   Diagnosis Date Noted  . Drug-seeking behavior 10/08/2018  . MRSA bacteremia 10/01/2018  . PAD (peripheral artery disease) ([place]) 10/01/2018  . Peripheral neuropathy 10/01/2018  . Cigarette smoker 10/01/2018  . S/P transmetatarsal amputation of foot, left ([place]) 10/01/2018  . Pressure injury of skin 10/01/2018  . Chronic renal insufficiency, stage 3 (moderate) 10/01/2018  . Paroxysmal atrial fibrillation ([place]) 10/01/2018  . Unintentional weight loss 10/01/2018  . Poor dentition 10/01/2018  . Sepsis ([place]) 09/30/2018  . Open wound of left foot 09/30/2018  . Osteomyelitis ([place]) 09/30/2018  . Leukocytosis 09/30/2018  . Fever 09/30/2018  . Microcytic anemia 09/30/2018  . HTN (hypertension) 02/23/2018  . Diabetes ([place]) 02/23/2018  . HLD (hyperlipidemia) 02/23/2018  . COPD (chronic obstructive pulmonary disease) ([place]) 02/23/2018    Past Surgical History:  Procedure Laterality Date  . ABSCESS DRAINAGE    . AMPUTATION Left 10/02/2018   Procedure: left BELOW KNEE AMPUTATION;  Surgeon: Angelia Mould, MD;  Location: [facility];  Service: Vascular;  Laterality: Left;  . TEE WITHOUT CARDIOVERSION N/A 10/05/2018   Procedure: TRANSESOPHAGEAL ECHOCARDIOGRAM (TEE);   Surgeon: Jerline Pain, MD;  Location[facility] ENDOSCOPY;  Service: Cardiovascular;  Laterality: N/A;       Family History  Problem Relation Age of Onset  . Cancer Father   . Hypertension Father   . Heart disease Father   . Diabetes Father   . Stroke Sister   . Stroke Brother     Social History   Tobacco Use  . Smoking status: Heavy Tobacco Smoker  . Smokeless tobacco: Never Used  Substance Use Topics  . Alcohol use: Yes  . Drug use: Yes    Comment: Heroin    Home Medications Prior to Admission medications   Medication Sig Start Date End Date Taking? Authorizing Provider  amLODipine (NORVASC) 10 MG tablet Take 1 tablet (10 mg total) by mouth daily. 10/12/18   Regalado, Belkys A, MD  atorvastatin (LIPITOR) 40 MG tablet Take 1 tablet (40 mg total) by mouth at bedtime. 10/12/18   Regalado, Belkys A, MD  carvedilol (COREG) 12.5 MG tablet Take 1 tablet (12.5 mg total) by mouth 2 (two) times daily. 10/12/18   Regalado, Belkys A, MD  ferrous sulfate 325 (65 FE) MG tablet Take 1 tablet (325 mg total) by mouth daily with breakfast. 10/12/18   Regalado, Belkys A, MD  gabapentin (NEURONTIN) 300 MG capsule Take 2 capsules (600 mg total) by mouth 3 (three) times daily. 10/12/18   Regalado, Belkys A, MD  insulin NPH Human (HUMULIN N) 100 UNIT/ML injection Inject 0.02 mLs (2 Units total) into the skin 2 (two) times daily  before a meal. 10/12/18   Regalado, Belkys A, MD  losartan (COZAAR) 25 MG tablet Take 1 tablet (25 mg total) by mouth daily. 10/12/18   Regalado, Belkys A, MD  nicotine (NICODERM CQ - DOSED IN MG/24 HOURS) 21 mg/24hr patch Place 1 patch (21 mg total) onto the skin daily. Patient not taking: Reported on 09/30/2018 02/24/18 02/24/19  Bettey Costa, MD  polyethylene glycol (MIRALAX / GLYCOLAX) 17 g packet Take 17 g by mouth daily. 10/12/18   Regalado, Cassie Freer, MD  Rivaroxaban 15 & 20 MG TBPK Follow package directions: Take one 15mg  tablet by mouth twice a day. On day 22, switch to one 20mg  tablet  once a day. Take with food. 10/12/18   Regalado, Belkys A, MD  senna-docusate (SENOKOT-S) 8.6-50 MG tablet Take 2 tablets by mouth 2 (two) times daily. 10/12/18   Regalado, Jerald Kief A, MD    Allergies    Amoxicillin-pot clavulanate  Review of Systems   Review of Systems  Unable to perform ROS: Mental status change    Physical Exam Updated Vital Signs BP (!) 199/98 (BP Location: Left Arm)   Pulse 90   Temp (!) 97.4 F (36.3 C) (Oral)   Resp 20   SpO2 100%   Physical Exam Vitals and nursing note reviewed.  Constitutional:      General: He is not in acute distress.    Appearance: He is well-developed. He is ill-appearing. He is not toxic-appearing or diaphoretic.  HENT:     Head: Normocephalic and atraumatic.     Right Ear: External ear normal.     Left Ear: External ear normal.  Eyes:     Conjunctiva/sclera: Conjunctivae normal.     Pupils: Pupils are equal, round, and reactive to light.  Neck:     Trachea: Phonation normal.  Cardiovascular:     Rate and Rhythm: Normal rate.  Pulmonary:     Effort: Pulmonary effort is normal. No respiratory distress.     Breath sounds: No stridor.  Musculoskeletal:        General: Normal range of motion.     Cervical back: Normal range of motion and neck supple.  Skin:    General: Skin is warm and dry.  Neurological:     Mental Status: He is alert.     Motor: No abnormal muscle tone.     Comments: Dysarthric.  No ataxia.  Sitting in chair comfortably.  He declines offer to sit in, or lie down on stretcher.  Psychiatric:     Comments: Obtunded     ED Results / Procedures / Treatments   Labs (all labs ordered are listed, but only abnormal results are displayed) Labs Reviewed  CBG MONITORING, ED - Abnormal; Notable for the following components:      Result Value   Glucose-Capillary 110 (*)    All other components within normal limits  POC SARS CORONAVIRUS 2 AG -  ED - Abnormal; Notable for the following components:   SARS  Coronavirus 2 Ag POSITIVE (*)    All other components within normal limits  BASIC METABOLIC PANEL  CBC WITH DIFFERENTIAL/PLATELET  CBC    EKG EKG Interpretation  Date/Time:  Friday January 04 2019 11:01:59 EST Ventricular Rate:  94 PR Interval:  146 QRS Duration: 76 QT Interval:  388 QTC Calculation: 485 R Axis:   -64 Text Interpretation: Normal sinus rhythm Left axis deviation Inferior infarct , age undetermined Anterior infarct , age undetermined T wave abnormality, consider lateral ischemia  Abnormal ECG Artifact since last tracing no significant change Confirmed by Daleen Bo [phone]) on 01/04/2019 11:27:04 AM   Radiology DG Chest 2 View  Result Date: 01/04/2019 CLINICAL DATA:  Shortness of breath. EXAM: CHEST - 2 VIEW COMPARISON:  September 20, 2018. FINDINGS: Stable cardiomediastinal silhouette. No pneumothorax is noted. Bilateral ground-glass opacities are noted consistent with multifocal pneumonia. Moderate size probably loculated right pleural effusion is noted as well. Bony thorax is unremarkable. IMPRESSION: Bilateral ground-glass opacities are noted consistent with multifocal pneumonia. Moderate size probably loculated right pleural effusion is noted as well. Electronically Signed   By: Marijo Conception M.D.   On: 01/04/2019 11:35    Procedures Procedures (including critical care time)  Medications Ordered in ED Medications - No data to display  ED Course  I have reviewed the triage vital signs and the nursing notes.  Pertinent labs & imaging results that were available during my care of the patient were reviewed by me and considered in my medical decision making (see chart for details).    MDM Rules/Calculators/A&P                       Patient Vitals for the past 24 hrs:  BP Temp Temp src Pulse Resp SpO2  01/04/19 1106 (!) 199/98 (!) 97.4 F (36.3 C) Oral -- -- --  01/04/19 1105 -- -- -- 90 20 100 %    3:17 PM Reevaluation with update and discussion.  After initial assessment and treatment, an updated evaluation reveals patient is now alert, and cooperative and conversant.  He states he is ready to go because "my ride is here."  I discussed the findings with him and the importance of avoiding contact with people to prevent infecting them with COVID-19.  Patient affirms that he is not on dialysis. Daleen Bo   Medical Decision Making: Nonspecific lethargy, transient, with history of polysubstance abuse.  Screening evaluation is consistent with COVID-19 infection.  Patient's blood could not be obtained, because of difficult stick.  At discharge he was improved.  No indication for hospitalization at this time.  Suspect uncomplicated [id number] infection.  Lavonne Amparano was evaluated in Emergency Department on 01/04/2019 for the symptoms described in the history of present illness. He was evaluated in the context of the global COVID-19 pandemic, which necessitated consideration that the patient might be at risk for infection with the SARS-CoV-2 virus that causes COVID-19. Institutional protocols and algorithms that pertain to the evaluation of patients at risk for COVID-19 are in a state of rapid change based on information released by regulatory bodies including the CDC and federal and state organizations. These policies and algorithms were followed during the patient's care in the ED.   Nursing Notes Reviewed/ Care Coordinated Applicable Imaging Reviewed Interpretation of Laboratory Data incorporated into ED treatment   CRITICAL CARE-no Performed by: Daleen Bo  Nursing Notes Reviewed/ Care Coordinated Applicable Imaging Reviewed Interpretation of Laboratory Data incorporated into ED treatment  The patient appears reasonably screened and/or stabilized for discharge and I doubt any other medical condition or other [facility] requiring further screening, evaluation, or treatment in the ED at this time prior to discharge.  Plan: Home  Medications-continue usual, Tylenol for pain or fever, Robitussin for cough; Home Treatments-strict isolation for 2 weeks, fluids, rest, regular diet; return here if the recommended treatment, does not improve the symptoms; Recommended follow up-PCP, check in 1 to 2 weeks and as needed.  Final Clinical Impression(s) / ED  Diagnoses Final diagnoses:  [id number] virus infection    Rx / DC Orders ED Discharge Orders    None       Daleen Bo, MD 01/04/19 [phone]

## 2019-01-04 NOTE — ED Notes (Addendum)
Patient verbalizes understanding of discharge instructions. Opportunity for questioning and answers were provided. pt discharged from ED.   This RN took pt out to ED entrance, pt states he does not see his ride. Pt requesting this RN contact "[employer]", however pt states he does not know his number.   This RN called pt daughter, [name], who states she will come pick up patient from ED.

## 2019-01-04 NOTE — ED Triage Notes (Signed)
Patient reports shortness of breath onset of yesterday while sitting up talking. C/o bone pain in his left leg where he has amputation. Patient appears drowsy during triage.

## 2019-01-04 NOTE — ED Notes (Signed)
Unsuccessful lab draw attempt x2 

## 2019-01-04 NOTE — ED Notes (Signed)
Patient transported to X-ray 

## 2019-01-04 NOTE — Discharge Instructions (Addendum)
You have the coronavirus virus, COVID-19 infection.  Stay away from everyone, for 2 weeks, so you do not get them sick.  Try to eat and drink regularly.  For fever use Tylenol 650 mg every 4 hours.  For cough use Robitussin-DM.  See your doctor for a checkup in 1 or 2 weeks.

## 2019-01-07 ENCOUNTER — Inpatient Hospital Stay (HOSPITAL_COMMUNITY): Payer: Medicaid Other

## 2019-01-07 ENCOUNTER — Other Ambulatory Visit: Payer: Self-pay

## 2019-01-07 ENCOUNTER — Emergency Department (HOSPITAL_COMMUNITY): Payer: Medicaid Other

## 2019-01-07 ENCOUNTER — Encounter (HOSPITAL_COMMUNITY): Payer: Self-pay | Admitting: Pharmacy Technician

## 2019-01-07 ENCOUNTER — Inpatient Hospital Stay (HOSPITAL_COMMUNITY)
Admission: EM | Admit: 2019-01-07 | Discharge: 2019-02-18 | DRG: 207 | Disposition: E | Payer: Medicaid Other | Attending: Internal Medicine | Admitting: Internal Medicine

## 2019-01-07 DIAGNOSIS — E8809 Other disorders of plasma-protein metabolism, not elsewhere classified: Secondary | ICD-10-CM | POA: Diagnosis present

## 2019-01-07 DIAGNOSIS — E119 Type 2 diabetes mellitus without complications: Secondary | ICD-10-CM

## 2019-01-07 DIAGNOSIS — Y835 Amputation of limb(s) as the cause of abnormal reaction of the patient, or of later complication, without mention of misadventure at the time of the procedure: Secondary | ICD-10-CM | POA: Diagnosis present

## 2019-01-07 DIAGNOSIS — N179 Acute kidney failure, unspecified: Secondary | ICD-10-CM | POA: Diagnosis present

## 2019-01-07 DIAGNOSIS — J690 Pneumonitis due to inhalation of food and vomit: Secondary | ICD-10-CM | POA: Diagnosis not present

## 2019-01-07 DIAGNOSIS — F10239 Alcohol dependence with withdrawal, unspecified: Secondary | ICD-10-CM | POA: Diagnosis present

## 2019-01-07 DIAGNOSIS — Z833 Family history of diabetes mellitus: Secondary | ICD-10-CM

## 2019-01-07 DIAGNOSIS — Z0189 Encounter for other specified special examinations: Secondary | ICD-10-CM

## 2019-01-07 DIAGNOSIS — E162 Hypoglycemia, unspecified: Secondary | ICD-10-CM | POA: Diagnosis not present

## 2019-01-07 DIAGNOSIS — J9811 Atelectasis: Secondary | ICD-10-CM | POA: Diagnosis present

## 2019-01-07 DIAGNOSIS — N183 Chronic kidney disease, stage 3 unspecified: Secondary | ICD-10-CM | POA: Diagnosis present

## 2019-01-07 DIAGNOSIS — I130 Hypertensive heart and chronic kidney disease with heart failure and stage 1 through stage 4 chronic kidney disease, or unspecified chronic kidney disease: Secondary | ICD-10-CM | POA: Diagnosis present

## 2019-01-07 DIAGNOSIS — Z66 Do not resuscitate: Secondary | ICD-10-CM | POA: Diagnosis not present

## 2019-01-07 DIAGNOSIS — E1142 Type 2 diabetes mellitus with diabetic polyneuropathy: Secondary | ICD-10-CM | POA: Diagnosis present

## 2019-01-07 DIAGNOSIS — Z515 Encounter for palliative care: Secondary | ICD-10-CM | POA: Diagnosis not present

## 2019-01-07 DIAGNOSIS — R2243 Localized swelling, mass and lump, lower limb, bilateral: Secondary | ICD-10-CM | POA: Diagnosis not present

## 2019-01-07 DIAGNOSIS — G931 Anoxic brain damage, not elsewhere classified: Secondary | ICD-10-CM

## 2019-01-07 DIAGNOSIS — Z452 Encounter for adjustment and management of vascular access device: Secondary | ICD-10-CM

## 2019-01-07 DIAGNOSIS — I469 Cardiac arrest, cause unspecified: Secondary | ICD-10-CM | POA: Diagnosis not present

## 2019-01-07 DIAGNOSIS — F1920 Other psychoactive substance dependence, uncomplicated: Secondary | ICD-10-CM | POA: Diagnosis present

## 2019-01-07 DIAGNOSIS — T8579XA Infection and inflammatory reaction due to other internal prosthetic devices, implants and grafts, initial encounter: Secondary | ICD-10-CM

## 2019-01-07 DIAGNOSIS — G92 Toxic encephalopathy: Secondary | ICD-10-CM | POA: Diagnosis present

## 2019-01-07 DIAGNOSIS — Z4659 Encounter for fitting and adjustment of other gastrointestinal appliance and device: Secondary | ICD-10-CM

## 2019-01-07 DIAGNOSIS — Y95 Nosocomial condition: Secondary | ICD-10-CM | POA: Diagnosis not present

## 2019-01-07 DIAGNOSIS — J9601 Acute respiratory failure with hypoxia: Secondary | ICD-10-CM

## 2019-01-07 DIAGNOSIS — R570 Cardiogenic shock: Secondary | ICD-10-CM | POA: Diagnosis not present

## 2019-01-07 DIAGNOSIS — E870 Hyperosmolality and hypernatremia: Secondary | ICD-10-CM | POA: Diagnosis not present

## 2019-01-07 DIAGNOSIS — I5023 Acute on chronic systolic (congestive) heart failure: Secondary | ICD-10-CM | POA: Diagnosis present

## 2019-01-07 DIAGNOSIS — J189 Pneumonia, unspecified organism: Secondary | ICD-10-CM

## 2019-01-07 DIAGNOSIS — Z794 Long term (current) use of insulin: Secondary | ICD-10-CM

## 2019-01-07 DIAGNOSIS — I252 Old myocardial infarction: Secondary | ICD-10-CM

## 2019-01-07 DIAGNOSIS — T8744 Infection of amputation stump, left lower extremity: Secondary | ICD-10-CM | POA: Diagnosis present

## 2019-01-07 DIAGNOSIS — J1289 Other viral pneumonia: Secondary | ICD-10-CM | POA: Diagnosis present

## 2019-01-07 DIAGNOSIS — E1122 Type 2 diabetes mellitus with diabetic chronic kidney disease: Secondary | ICD-10-CM | POA: Diagnosis present

## 2019-01-07 DIAGNOSIS — I10 Essential (primary) hypertension: Secondary | ICD-10-CM | POA: Diagnosis not present

## 2019-01-07 DIAGNOSIS — J1108 Influenza due to unidentified influenza virus with specified pneumonia: Secondary | ICD-10-CM | POA: Diagnosis present

## 2019-01-07 DIAGNOSIS — J440 Chronic obstructive pulmonary disease with acute lower respiratory infection: Secondary | ICD-10-CM | POA: Diagnosis present

## 2019-01-07 DIAGNOSIS — S01512A Laceration without foreign body of oral cavity, initial encounter: Secondary | ICD-10-CM | POA: Diagnosis not present

## 2019-01-07 DIAGNOSIS — B359 Dermatophytosis, unspecified: Secondary | ICD-10-CM | POA: Diagnosis not present

## 2019-01-07 DIAGNOSIS — U071 COVID-19: Secondary | ICD-10-CM | POA: Diagnosis not present

## 2019-01-07 DIAGNOSIS — E1151 Type 2 diabetes mellitus with diabetic peripheral angiopathy without gangrene: Secondary | ICD-10-CM | POA: Diagnosis present

## 2019-01-07 DIAGNOSIS — I513 Intracardiac thrombosis, not elsewhere classified: Secondary | ICD-10-CM | POA: Diagnosis present

## 2019-01-07 DIAGNOSIS — B1920 Unspecified viral hepatitis C without hepatic coma: Secondary | ICD-10-CM | POA: Diagnosis present

## 2019-01-07 DIAGNOSIS — F17210 Nicotine dependence, cigarettes, uncomplicated: Secondary | ICD-10-CM | POA: Diagnosis present

## 2019-01-07 DIAGNOSIS — E785 Hyperlipidemia, unspecified: Secondary | ICD-10-CM | POA: Diagnosis present

## 2019-01-07 DIAGNOSIS — E11649 Type 2 diabetes mellitus with hypoglycemia without coma: Secondary | ICD-10-CM | POA: Diagnosis present

## 2019-01-07 DIAGNOSIS — I480 Paroxysmal atrial fibrillation: Secondary | ICD-10-CM | POA: Diagnosis present

## 2019-01-07 DIAGNOSIS — F1910 Other psychoactive substance abuse, uncomplicated: Secondary | ICD-10-CM | POA: Diagnosis present

## 2019-01-07 DIAGNOSIS — Z7901 Long term (current) use of anticoagulants: Secondary | ICD-10-CM

## 2019-01-07 DIAGNOSIS — I5021 Acute systolic (congestive) heart failure: Secondary | ICD-10-CM | POA: Diagnosis not present

## 2019-01-07 DIAGNOSIS — Z79899 Other long term (current) drug therapy: Secondary | ICD-10-CM

## 2019-01-07 DIAGNOSIS — B9562 Methicillin resistant Staphylococcus aureus infection as the cause of diseases classified elsewhere: Secondary | ICD-10-CM | POA: Diagnosis present

## 2019-01-07 DIAGNOSIS — E871 Hypo-osmolality and hyponatremia: Secondary | ICD-10-CM | POA: Diagnosis present

## 2019-01-07 DIAGNOSIS — Z8614 Personal history of Methicillin resistant Staphylococcus aureus infection: Secondary | ICD-10-CM

## 2019-01-07 DIAGNOSIS — R410 Disorientation, unspecified: Secondary | ICD-10-CM | POA: Diagnosis not present

## 2019-01-07 DIAGNOSIS — D696 Thrombocytopenia, unspecified: Secondary | ICD-10-CM | POA: Diagnosis not present

## 2019-01-07 DIAGNOSIS — J1282 Pneumonia due to coronavirus disease 2019: Secondary | ICD-10-CM | POA: Diagnosis present

## 2019-01-07 DIAGNOSIS — I272 Pulmonary hypertension, unspecified: Secondary | ICD-10-CM | POA: Diagnosis present

## 2019-01-07 DIAGNOSIS — J449 Chronic obstructive pulmonary disease, unspecified: Secondary | ICD-10-CM | POA: Diagnosis present

## 2019-01-07 DIAGNOSIS — J9809 Other diseases of bronchus, not elsewhere classified: Secondary | ICD-10-CM | POA: Diagnosis present

## 2019-01-07 DIAGNOSIS — R109 Unspecified abdominal pain: Secondary | ICD-10-CM

## 2019-01-07 DIAGNOSIS — I739 Peripheral vascular disease, unspecified: Secondary | ICD-10-CM | POA: Diagnosis present

## 2019-01-07 DIAGNOSIS — J431 Panlobular emphysema: Secondary | ICD-10-CM | POA: Diagnosis not present

## 2019-01-07 DIAGNOSIS — Z823 Family history of stroke: Secondary | ICD-10-CM

## 2019-01-07 DIAGNOSIS — E875 Hyperkalemia: Secondary | ICD-10-CM | POA: Diagnosis present

## 2019-01-07 DIAGNOSIS — I361 Nonrheumatic tricuspid (valve) insufficiency: Secondary | ICD-10-CM | POA: Diagnosis not present

## 2019-01-07 DIAGNOSIS — Z9911 Dependence on respirator [ventilator] status: Secondary | ICD-10-CM

## 2019-01-07 DIAGNOSIS — I340 Nonrheumatic mitral (valve) insufficiency: Secondary | ICD-10-CM | POA: Diagnosis not present

## 2019-01-07 DIAGNOSIS — E1165 Type 2 diabetes mellitus with hyperglycemia: Secondary | ICD-10-CM | POA: Diagnosis present

## 2019-01-07 DIAGNOSIS — J188 Other pneumonia, unspecified organism: Secondary | ICD-10-CM

## 2019-01-07 DIAGNOSIS — R0602 Shortness of breath: Secondary | ICD-10-CM

## 2019-01-07 DIAGNOSIS — Z809 Family history of malignant neoplasm, unspecified: Secondary | ICD-10-CM

## 2019-01-07 DIAGNOSIS — Z8249 Family history of ischemic heart disease and other diseases of the circulatory system: Secondary | ICD-10-CM

## 2019-01-07 LAB — CBC WITH DIFFERENTIAL/PLATELET
Abs Immature Granulocytes: 0.04 10*3/uL (ref 0.00–0.07)
Basophils Absolute: 0 10*3/uL (ref 0.0–0.1)
Basophils Relative: 1 %
Eosinophils Absolute: 0 10*3/uL (ref 0.0–0.5)
Eosinophils Relative: 1 %
HCT: 48.1 % (ref 39.0–52.0)
Hemoglobin: 14.8 g/dL (ref 13.0–17.0)
Immature Granulocytes: 1 %
Lymphocytes Relative: 31 %
Lymphs Abs: 1.5 10*3/uL (ref 0.7–4.0)
MCH: 20.7 pg — ABNORMAL LOW (ref 26.0–34.0)
MCHC: 30.8 g/dL (ref 30.0–36.0)
MCV: 67.4 fL — ABNORMAL LOW (ref 80.0–100.0)
Monocytes Absolute: 0.5 10*3/uL (ref 0.1–1.0)
Monocytes Relative: 10 %
Neutro Abs: 2.8 10*3/uL (ref 1.7–7.7)
Neutrophils Relative %: 56 %
Platelets: 267 10*3/uL (ref 150–400)
RBC: 7.14 MIL/uL — ABNORMAL HIGH (ref 4.22–5.81)
RDW: 22.4 % — ABNORMAL HIGH (ref 11.5–15.5)
WBC: 4.8 10*3/uL (ref 4.0–10.5)
nRBC: 6.2 % — ABNORMAL HIGH (ref 0.0–0.2)

## 2019-01-07 LAB — POCT I-STAT 7, (LYTES, BLD GAS, ICA,H+H)
Acid-Base Excess: 1 mmol/L (ref 0.0–2.0)
Bicarbonate: 24.3 mmol/L (ref 20.0–28.0)
Calcium, Ion: 1.11 mmol/L — ABNORMAL LOW (ref 1.15–1.40)
HCT: 46 % (ref 39.0–52.0)
Hemoglobin: 15.6 g/dL (ref 13.0–17.0)
O2 Saturation: 90 %
Patient temperature: 97.3
Potassium: 4.3 mmol/L (ref 3.5–5.1)
Sodium: 141 mmol/L (ref 135–145)
TCO2: 25 mmol/L (ref 22–32)
pCO2 arterial: 32.9 mmHg (ref 32.0–48.0)
pH, Arterial: 7.472 — ABNORMAL HIGH (ref 7.350–7.450)
pO2, Arterial: 51 mmHg — ABNORMAL LOW (ref 83.0–108.0)

## 2019-01-07 LAB — LACTIC ACID, PLASMA
Lactic Acid, Venous: 3 mmol/L (ref 0.5–1.9)
Lactic Acid, Venous: 2.3 mmol/L (ref 0.5–1.9)

## 2019-01-07 LAB — C-REACTIVE PROTEIN: CRP: 7.6 mg/dL — ABNORMAL HIGH (ref <1.0)

## 2019-01-07 LAB — COMPREHENSIVE METABOLIC PANEL
ALT: 37 U/L (ref 0–44)
AST: 69 U/L — ABNORMAL HIGH (ref 15–41)
Albumin: 2.1 g/dL — ABNORMAL LOW (ref 3.5–5.0)
Alkaline Phosphatase: 154 U/L — ABNORMAL HIGH (ref 38–126)
Anion gap: 12 (ref 5–15)
BUN: 19 mg/dL (ref 6–20)
CO2: 23 mmol/L (ref 22–32)
Calcium: 8.1 mg/dL — ABNORMAL LOW (ref 8.9–10.3)
Chloride: 103 mmol/L (ref 98–111)
Creatinine, Ser: 1.54 mg/dL — ABNORMAL HIGH (ref 0.61–1.24)
GFR calc Af Amer: 57 mL/min — ABNORMAL LOW (ref >60)
GFR calc non Af Amer: 49 mL/min — ABNORMAL LOW (ref >60)
Glucose, Bld: 78 mg/dL (ref 70–99)
Potassium: 5.2 mmol/L — ABNORMAL HIGH (ref 3.5–5.1)
Sodium: 138 mmol/L (ref 135–145)
Total Bilirubin: 1.9 mg/dL — ABNORMAL HIGH (ref 0.3–1.2)
Total Protein: 8.3 g/dL — ABNORMAL HIGH (ref 6.5–8.1)

## 2019-01-07 LAB — FERRITIN: Ferritin: 98 ng/mL (ref 24–336)

## 2019-01-07 LAB — LACTATE DEHYDROGENASE: LDH: 424 U/L — ABNORMAL HIGH (ref 98–192)

## 2019-01-07 LAB — TRIGLYCERIDES: Triglycerides: 111 mg/dL (ref <150)

## 2019-01-07 LAB — D-DIMER, QUANTITATIVE: D-Dimer, Quant: 8.09 ug/mL-FEU — ABNORMAL HIGH (ref 0.00–0.50)

## 2019-01-07 LAB — PROCALCITONIN: Procalcitonin: 0.2 ng/mL

## 2019-01-07 LAB — FIBRINOGEN: Fibrinogen: 346 mg/dL (ref 210–475)

## 2019-01-07 MED ORDER — SODIUM CHLORIDE 0.9 % IV SOLN: Freq: Once | INTRAVENOUS | Status: AC

## 2019-01-07 NOTE — ED Notes (Signed)
Pt placed back on monitor and placed [place] back on patient. Pt not wanting to wear [place] at this time. Encouraged patient to leave  in place. Pt agreeable at this time.

## 2019-01-07 NOTE — ED Provider Notes (Signed)
[facility] EMERGENCY DEPARTMENT Provider Note   CSN: [id number] Arrival date & time: 01/11/2019  1819     History No chief complaint on file. ml - weakness, sob  Daniel Moon is a 58 y.o. male.  He has a history of a BKA about a month ago and was just in the emergency department 3 days ago for shortness of breath and altered mental status.  Level 5 caveat secondary to lethargy.  Patient was brought in from home by EMS for increasing weakness and shortness of breath.  Is not usually on oxygen.  States his left leg is hurting.  Nods off during interview.  The history is provided by the patient and the EMS personnel.  Influenza Presenting symptoms: cough, fatigue, fever, myalgias and shortness of breath   Severity:  Moderate Onset quality:  Gradual Progression:  Worsening Chronicity:  New Relieved by:  Nothing Worsened by:  Movement Ineffective treatments:  None tried Associated symptoms: decreased physical activity and mental status change   Associated symptoms: no chills   Risk factors: liver disease        Past Medical History:  Diagnosis Date  . Diabetes mellitus without complication ([place])   . Heart attack ([place])   . Hepatitis C   . HLD (hyperlipidemia)   . HTN (hypertension)   . Peripheral vascular disease ([place])   . Systolic CHF [facility])     Patient Active Problem List   Diagnosis Date Noted  . Drug-seeking behavior 10/08/2018  . MRSA bacteremia 10/01/2018  . PAD (peripheral artery disease) ([place]) 10/01/2018  . Peripheral neuropathy 10/01/2018  . Cigarette smoker 10/01/2018  . S/P transmetatarsal amputation of foot, left ([place]) 10/01/2018  . Pressure injury of skin 10/01/2018  . Chronic renal insufficiency, stage 3 (moderate) 10/01/2018  . Paroxysmal atrial fibrillation ([place]) 10/01/2018  . Unintentional weight loss 10/01/2018  . Poor dentition 10/01/2018  . Sepsis ([place]) 09/30/2018  . Open wound of left foot 09/30/2018  . Osteomyelitis ([place])  09/30/2018  . Leukocytosis 09/30/2018  . Fever 09/30/2018  . Microcytic anemia 09/30/2018  . HTN (hypertension) 02/23/2018  . Diabetes ([place]) 02/23/2018  . HLD (hyperlipidemia) 02/23/2018  . COPD (chronic obstructive pulmonary disease) ([place]) 02/23/2018    Past Surgical History:  Procedure Laterality Date  . ABSCESS DRAINAGE    . AMPUTATION Left 10/02/2018   Procedure: left BELOW KNEE AMPUTATION;  Surgeon: Angelia Mould, MD;  Location: [facility];  Service: Vascular;  Laterality: Left;  . TEE WITHOUT CARDIOVERSION N/A 10/05/2018   Procedure: TRANSESOPHAGEAL ECHOCARDIOGRAM (TEE);  Surgeon: Jerline Pain, MD;  Location[facility]COPY;  Service: Cardiovascular;  Laterality: N/A;       Family History  Problem Relation Age of Onset  . Cancer Father   . Hypertension Father   . Heart disease Father   . Diabetes Father   . Stroke Sister   . Stroke Brother     Social History   Tobacco Use  . Smoking status: Heavy Tobacco Smoker  . Smokeless tobacco: Never Used  Substance Use Topics  . Alcohol use: Yes  . Drug use: Yes    Comment: Heroin    Home Medications Prior to Admission medications   Medication Sig Start Date End Date Taking? Authorizing Provider  amLODipine (NORVASC) 10 MG tablet Take 1 tablet (10 mg total) by mouth daily. 10/12/18   Regalado, Belkys A, MD  atorvastatin (LIPITOR) 40 MG tablet Take 1 tablet (40 mg total) by mouth at bedtime. 10/12/18  Regalado, Belkys A, MD  carvedilol (COREG) 12.5 MG tablet Take 1 tablet (12.5 mg total) by mouth 2 (two) times daily. 10/12/18   Regalado, Belkys A, MD  ferrous sulfate 325 (65 FE) MG tablet Take 1 tablet (325 mg total) by mouth daily with breakfast. 10/12/18   Regalado, Belkys A, MD  gabapentin (NEURONTIN) 300 MG capsule Take 2 capsules (600 mg total) by mouth 3 (three) times daily. 10/12/18   Regalado, Belkys A, MD  insulin NPH Human (HUMULIN N) 100 UNIT/ML injection Inject 0.02 mLs (2 Units total) into the skin 2 (two) times  daily before a meal. 10/12/18   Regalado, Belkys A, MD  losartan (COZAAR) 25 MG tablet Take 1 tablet (25 mg total) by mouth daily. 10/12/18   Regalado, Belkys A, MD  nicotine (NICODERM CQ - DOSED IN MG/24 HOURS) 21 mg/24hr patch Place 1 patch (21 mg total) onto the skin daily. Patient not taking: Reported on 09/30/2018 02/24/18 02/24/19  Bettey Costa, MD  polyethylene glycol (MIRALAX / GLYCOLAX) 17 g packet Take 17 g by mouth daily. 10/12/18   Regalado, Cassie Freer, MD  Rivaroxaban 15 & 20 MG TBPK Follow package directions: Take one 15mg  tablet by mouth twice a day. On day 22, switch to one 20mg  tablet once a day. Take with food. 10/12/18   Regalado, Belkys A, MD  senna-docusate (SENOKOT-S) 8.6-50 MG tablet Take 2 tablets by mouth 2 (two) times daily. 10/12/18   Regalado, Belkys A, MD    Allergies    Amoxicillin-pot clavulanate  Review of Systems   Review of Systems  Unable to perform ROS: Mental status change  Constitutional: Positive for fatigue and fever. Negative for chills.  Respiratory: Positive for cough and shortness of breath.   Musculoskeletal: Positive for myalgias.    Physical Exam Updated Vital Signs BP (!) 142/107   Pulse 97   Temp (!) 97.3 F (36.3 C) (Oral)   Resp 18   SpO2 97%   Physical Exam Vitals and nursing note reviewed.  Constitutional:      Appearance: He is well-developed.  HENT:     Head: Normocephalic and atraumatic.  Eyes:     Conjunctiva/sclera: Conjunctivae normal.  Cardiovascular:     Rate and Rhythm: Normal rate and regular rhythm.     Heart sounds: No murmur.  Pulmonary:     Effort: Pulmonary effort is normal. No respiratory distress.     Breath sounds: Normal breath sounds.  Abdominal:     Palpations: Abdomen is soft.     Tenderness: There is no abdominal tenderness.  Musculoskeletal:        General: Normal range of motion.     Cervical back: Neck supple.     Comments: Left BKA  Skin:    General: Skin is warm and dry.     Capillary Refill:  Capillary refill takes less than 2 seconds.  Neurological:     Comments: Patient is arousable to voice.  He will answer questions but then he falls asleep during answering.  Moving all extremities.     ED Results / Procedures / Treatments   Labs (all labs ordered are listed, but only abnormal results are displayed) Labs Reviewed  LACTIC ACID, PLASMA - Abnormal; Notable for the following components:      Result Value   Lactic Acid, Venous 3.0 (*)    All other components within normal limits  LACTIC ACID, PLASMA - Abnormal; Notable for the following components:   Lactic Acid, Venous 2.3 (*)  All other components within normal limits  CBC WITH DIFFERENTIAL/PLATELET - Abnormal; Notable for the following components:   RBC 7.14 (*)    MCV 67.4 (*)    MCH 20.7 (*)    RDW 22.4 (*)    nRBC 6.2 (*)    All other components within normal limits  COMPREHENSIVE METABOLIC PANEL - Abnormal; Notable for the following components:   Potassium 5.2 (*)    Creatinine, Ser 1.54 (*)    Calcium 8.1 (*)    Total Protein 8.3 (*)    Albumin 2.1 (*)    AST 69 (*)    Alkaline Phosphatase 154 (*)    Total Bilirubin 1.9 (*)    GFR calc non Af Amer 49 (*)    GFR calc Af Amer 57 (*)    All other components within normal limits  D-DIMER, QUANTITATIVE (NOT AT [facility]) - Abnormal; Notable for the following components:   D-Dimer, Quant 8.09 (*)    All other components within normal limits  LACTATE DEHYDROGENASE - Abnormal; Notable for the following components:   LDH 424 (*)    All other components within normal limits  C-REACTIVE PROTEIN - Abnormal; Notable for the following components:   CRP 7.6 (*)    All other components within normal limits  COMPREHENSIVE METABOLIC PANEL - Abnormal; Notable for the following components:   Glucose, Bld 126 (*)    Creatinine, Ser 1.52 (*)    Calcium 8.0 (*)    Albumin 1.7 (*)    AST 48 (*)    Alkaline Phosphatase 129 (*)    Total Bilirubin 1.4 (*)    GFR calc non Af  Amer 50 (*)    GFR calc Af Amer 58 (*)    All other components within normal limits  CBC WITH DIFFERENTIAL/PLATELET - Abnormal; Notable for the following components:   WBC 3.7 (*)    RBC 6.23 (*)    Hemoglobin 12.9 (*)    MCV 66.9 (*)    MCH 20.7 (*)    RDW 21.4 (*)    nRBC 6.2 (*)    Lymphs Abs 0.5 (*)    All other components within normal limits  C-REACTIVE PROTEIN - Abnormal; Notable for the following components:   CRP 6.1 (*)    All other components within normal limits  LACTIC ACID, PLASMA - Abnormal; Notable for the following components:   Lactic Acid, Venous 2.8 (*)    All other components within normal limits  BRAIN NATRIURETIC PEPTIDE - Abnormal; Notable for the following components:   B Natriuretic Peptide 2,480.7 (*)    All other components within normal limits  POCT I-STAT 7, (LYTES, BLD GAS, ICA,H+H) - Abnormal; Notable for the following components:   pH, Arterial 7.472 (*)    pO2, Arterial 51.0 (*)    Calcium, Ion 1.11 (*)    All other components within normal limits  CBG MONITORING, ED - Abnormal; Notable for the following components:   Glucose-Capillary 57 (*)    All other components within normal limits  CULTURE, BLOOD (ROUTINE X 2)  CULTURE, BLOOD (ROUTINE X 2)  PROCALCITONIN  FERRITIN  FIBRINOGEN  TRIGLYCERIDES  FERRITIN  LACTIC ACID, PLASMA  I-STAT ARTERIAL BLOOD GAS, ED  CBG MONITORING, ED    EKG EKG Interpretation  Date/Time:  Monday January 07 2019 18:31:04 EST Ventricular Rate:  97 PR Interval:    QRS Duration: 93 QT Interval:  391 QTC Calculation: 497 R Axis:   -64 Text Interpretation: Sinus rhythm  Inferior infarct, old Abnormal lateral Q waves Anterior infarct, old similar to prior today Confirmed by Aletta Edouard [id number]) on 01/13/2019 6:40:42 PM   Radiology CT Head Wo Contrast  Result Date: 12/20/2018 CLINICAL DATA:  58 year old male with encephalopathy. EXAM: CT HEAD WITHOUT CONTRAST TECHNIQUE: Contiguous axial images were  obtained from the base of the skull through the vertex without intravenous contrast. COMPARISON:  None. FINDINGS: Brain: The ventricles and sulci appropriate size for patient's age. Minimal periventricular and deep white matter chronic microvascular ischemic changes noted. There is a focal area of old infarct and encephalomalacia in the left cerebellar hemisphere. There is no acute intracranial hemorrhage. No mass effect or midline shift. No extra-axial fluid collection. Vascular: No hyperdense vessel or unexpected calcification. Skull: Normal. Negative for fracture or focal lesion. Sinuses/Orbits: Partial opacification of the left maxillary sinus. The remainder of the visualized paranasal sinuses and mastoid air cells are clear. Other: None IMPRESSION: 1. No acute intracranial hemorrhage. 2. Mild age-related atrophy and chronic microvascular ischemic changes. Old left cerebellar infarct. Electronically Signed   By: Anner Crete M.D.   On: 01/17/2019 23:07   CT CHEST WO CONTRAST  Result Date: 01/08/2019 CLINICAL DATA:  Shortness of breath.  COVID-19 positive EXAM: CT CHEST WITHOUT CONTRAST TECHNIQUE: Multidetector CT imaging of the chest was performed following the standard protocol without IV contrast. COMPARISON:  Chest radiograph January 07, 2019 FINDINGS: Cardiovascular: There is no thoracic aortic aneurysm on this noncontrast enhanced study. There are scattered foci of calcification in visualized great vessels. There are foci of aortic atherosclerosis. There are occasional foci of coronary artery calcification. There is no pericardial effusion or pericardial thickening evident. The main pulmonary outflow tract is prominent, measuring 3.3 cm in diameter. Mediastinum/Nodes: Thyroid appears unremarkable. There are scattered prominent mediastinal lymph nodes. There is a lymph node anterior to the carina. On the right measuring 1.5 x 1.4 cm. There is a lymph node adjacent to the aortic arch on the left  measuring 1.5 x 1.3 cm. A second lymph node adjacent to the aortic arch measures 1.5 x 1.3 cm. There is a lymph node in the aortopulmonary window region measuring 1.6 x 1.3 cm. There is a subcarinal lymph node measuring 1.5 x 1.5 cm. No esophageal lesions are evident. Lungs/Pleura: There are moderate free-flowing pleural effusions bilaterally. There is mild loculation in the right pleural effusion. There is compressive atelectasis in each lower lobe. There is widespread airspace opacity throughout the lungs bilaterally, with consolidation in a portion of the posterior segment left upper lobe. Other areas of opacity have a more ground-glass type appearance. Upper Abdomen: There is a degree of hepatic steatosis. Visualized upper abdominal structures otherwise appear unremarkable. Musculoskeletal: There is degenerative change in the thoracic spine. There are no blastic or lytic bone lesions. No chest wall lesions are evident. IMPRESSION: 1. Multifocal airspace disease bilaterally, primarily with ground-glass type opacity, although there is consolidation in a portion of the posterior segment of the left upper lobe. Suspect atypical organism pneumonia at least partially responsible for the findings. 2. Moderate pleural effusions bilaterally, slightly larger on the right than on the left with mild loculation on the right. Most of the effusions bilaterally are free-flowing. There is compressive atelectasis in each posterior lung base. 3. Several enlarged lymph nodes of uncertain etiology. Adenopathy may be of reactive/inflammatory etiology secondary to the extensive abnormality in the lung parenchyma. 4. Enlargement of the main pulmonary outflow tract, a finding indicative of underlying pulmonary arterial hypertension. 5.  Hepatic  steatosis. 6. Foci of aortic atherosclerosis as well as great vessel and coronary artery calcification. Aortic Atherosclerosis (ICD10-I70.0). Electronically Signed   By: Lowella Grip III  M.D.   On: 01/08/2019 08:48   DG Chest Port 1 View  Result Date: 01/03/2019 CLINICAL DATA:  Shortness of breath, COVID-19 positive, recent BKA EXAM: PORTABLE CHEST 1 VIEW COMPARISON:  Radiograph 01/04/2019 FINDINGS: Redemonstration of the bilateral interstitial and airspace opacities throughout both lungs similar to slightly increased accounting for differences in technique. There is a moderate right pleural effusion with some features suggesting loculation. Suspect some left effusion as well. No pneumothorax. Cardiomediastinal contours are partially obscured by overlying opacity but grossly similar accounting for differences in technique and patient rotation. No acute osseous or soft tissue abnormality. Degenerative changes are present in the imaged spine and shoulders. IMPRESSION: 1. Redemonstration of the bilateral interstitial and airspace opacities, similar to slightly increased in the interval. Compatible with multifocal pneumonia. 2. Moderate right pleural effusion with some features suggesting loculation. Suspect trace left effusion as well. Electronically Signed   By: Lovena Le M.D.   On: 01/06/2019 19:33    Procedures .Critical Care Performed by: Hayden Rasmussen, MD Authorized by: Hayden Rasmussen, MD   Critical care provider statement:    Critical care time (minutes):  45   Critical care time was exclusive of:  Separately billable procedures and treating other patients   Critical care was necessary to treat or prevent imminent or life-threatening deterioration of the following conditions:  CNS failure or compromise and respiratory failure   Critical care was time spent personally by me on the following activities:  Discussions with consultants, evaluation of patient's response to treatment, examination of patient, ordering and performing treatments and interventions, ordering and review of laboratory studies, ordering and review of radiographic studies, pulse oximetry, re-evaluation  of patient's condition, obtaining history from patient or surrogate, review of old charts and development of treatment plan with patient or surrogate   (including critical care time)  Medications Ordered in ED Medications  amLODipine (NORVASC) tablet 10 mg (has no administration in time range)  atorvastatin (LIPITOR) tablet 40 mg (40 mg Oral Given 01/08/19 0121)  carvedilol (COREG) tablet 12.5 mg (12.5 mg Oral Given 01/08/19 0121)  losartan (COZAAR) tablet 25 mg (has no administration in time range)  acetaminophen (TYLENOL) tablet 650 mg (has no administration in time range)    Or  acetaminophen (TYLENOL) suppository 650 mg (has no administration in time range)  ondansetron (ZOFRAN) tablet 4 mg (has no administration in time range)    Or  ondansetron (ZOFRAN) injection 4 mg (has no administration in time range)  insulin aspart (novoLOG) injection 0-9 Units (0 Units Subcutaneous Not Given 01/08/19 0815)  remdesivir 200 mg in sodium chloride 0.9% 250 mL IVPB (0 mg Intravenous Stopped 01/08/19 0200)    Followed by  remdesivir 100 mg in sodium chloride 0.9 % 100 mL IVPB (has no administration in time range)  dexamethasone (DECADRON) injection 6 mg (6 mg Intravenous Given 01/08/19 0121)  rivaroxaban (XARELTO) tablet 20 mg (20 mg Oral Given 01/08/19 0121)  methadone (DOLOPHINE) tablet 30 mg (30 mg Oral Given 01/08/19 0430)  0.9 %  sodium chloride infusion ( Intravenous Stopped 12/20/2018 2255)    ED Course  I have reviewed the triage vital signs and the nursing notes.  Pertinent labs & imaging results that were available during my care of the patient were reviewed by me and considered in my medical decision making (see  chart for details).  Clinical Course as of Jan 07 958  Mon Jan 06, 3917  2441 58 year old male with known history of Covid here with increased and Covid symptoms along with chronic left leg pain.  He is very sleepy here and on his ED visit 3 days ago similar but improved and  likely thought to be related to his pain medication.  Unfortunately he desatted into the low 80s and his chest x-ray looks like probably multifocal pneumonia so he will need to be admitted to the hospital.   [MB]  1906 Differential diagnosis includes Covid pneumonia, bacterial pneumonia with Covid, CHF, sepsis, Sirs, altered mental status, metabolic derangement   [MB]  2050 Discussed with Dr. Hal Hope [facility] hospitalist who will evaluate the patient for admission.  He asked that I get an ABG.   [MB]    Clinical Course User Index [MB] Hayden Rasmussen, MD   MDM Rules/Calculators/A&P                     Daniel Moon was evaluated in Emergency Department on 12/29/2018 for the symptoms described in the history of present illness. He was evaluated in the context of the global COVID-19 pandemic, which necessitated consideration that the patient might be at risk for infection with the SARS-CoV-2 virus that causes COVID-19. Institutional protocols and algorithms that pertain to the evaluation of patients at risk for COVID-19 are in a state of rapid change based on information released by regulatory bodies including the CDC and federal and state organizations. These policies and algorithms were followed during the patient's care in the ED.   Final Clinical Impression(s) / ED Diagnoses Final diagnoses:  COVID-19 virus infection  Multifocal pneumonia  AKI (acute kidney injury) ([place])  Confusion  Acute respiratory failure with hypoxia [facility])    Rx / DC Orders ED Discharge Orders    None       Hayden Rasmussen, MD 01/08/19 1003

## 2019-01-07 NOTE — ED Notes (Signed)
IV access attempted X2 without success. 

## 2019-01-07 NOTE — ED Triage Notes (Signed)
Pt bib ems c/o increasing weakness/sob after being dx with covid 3 days ago. Recent BKA approx 1 month ago. 4L [place] on arrival.  138/70, HR 98, RR20, 99% 4L.

## 2019-01-07 NOTE — ED Notes (Signed)
The mother for the pt is very concerned about the pt coming home she stated she has being helping him and unable to care for the pt if the pt come home. She stated she has to get test and she's 77 yrs. Old and can't care for herself. Mother name is Regino Schultze [phone]

## 2019-01-07 NOTE — ED Notes (Signed)
Pt SPO2 down to 84% with good pleth. Placed pt on 2L [place] with improvement to 93%. Will cont to monitor.

## 2019-01-08 ENCOUNTER — Inpatient Hospital Stay (HOSPITAL_COMMUNITY): Payer: Medicaid Other

## 2019-01-08 ENCOUNTER — Encounter (HOSPITAL_COMMUNITY): Payer: Self-pay | Admitting: Internal Medicine

## 2019-01-08 DIAGNOSIS — N179 Acute kidney failure, unspecified: Secondary | ICD-10-CM | POA: Insufficient documentation

## 2019-01-08 DIAGNOSIS — U071 COVID-19: Secondary | ICD-10-CM | POA: Diagnosis present

## 2019-01-08 LAB — COMPREHENSIVE METABOLIC PANEL
ALT: 27 U/L (ref 0–44)
AST: 48 U/L — ABNORMAL HIGH (ref 15–41)
Albumin: 1.7 g/dL — ABNORMAL LOW (ref 3.5–5.0)
Alkaline Phosphatase: 129 U/L — ABNORMAL HIGH (ref 38–126)
Anion gap: 11 (ref 5–15)
BUN: 19 mg/dL (ref 6–20)
CO2: 24 mmol/L (ref 22–32)
Calcium: 8 mg/dL — ABNORMAL LOW (ref 8.9–10.3)
Chloride: 102 mmol/L (ref 98–111)
Creatinine, Ser: 1.52 mg/dL — ABNORMAL HIGH (ref 0.61–1.24)
GFR calc Af Amer: 58 mL/min — ABNORMAL LOW (ref >60)
GFR calc non Af Amer: 50 mL/min — ABNORMAL LOW (ref >60)
Glucose, Bld: 126 mg/dL — ABNORMAL HIGH (ref 70–99)
Potassium: 4.8 mmol/L (ref 3.5–5.1)
Sodium: 137 mmol/L (ref 135–145)
Total Bilirubin: 1.4 mg/dL — ABNORMAL HIGH (ref 0.3–1.2)
Total Protein: 6.8 g/dL (ref 6.5–8.1)

## 2019-01-08 LAB — CBC WITH DIFFERENTIAL/PLATELET
Abs Immature Granulocytes: 0.05 10*3/uL (ref 0.00–0.07)
Basophils Absolute: 0 10*3/uL (ref 0.0–0.1)
Basophils Relative: 1 %
Eosinophils Absolute: 0 10*3/uL (ref 0.0–0.5)
Eosinophils Relative: 0 %
HCT: 41.7 % (ref 39.0–52.0)
Hemoglobin: 12.9 g/dL — ABNORMAL LOW (ref 13.0–17.0)
Immature Granulocytes: 1 %
Lymphocytes Relative: 15 %
Lymphs Abs: 0.5 10*3/uL — ABNORMAL LOW (ref 0.7–4.0)
MCH: 20.7 pg — ABNORMAL LOW (ref 26.0–34.0)
MCHC: 30.9 g/dL (ref 30.0–36.0)
MCV: 66.9 fL — ABNORMAL LOW (ref 80.0–100.0)
Monocytes Absolute: 0.2 10*3/uL (ref 0.1–1.0)
Monocytes Relative: 5 %
Neutro Abs: 2.9 10*3/uL (ref 1.7–7.7)
Neutrophils Relative %: 78 %
Platelets: 233 10*3/uL (ref 150–400)
RBC: 6.23 MIL/uL — ABNORMAL HIGH (ref 4.22–5.81)
RDW: 21.4 % — ABNORMAL HIGH (ref 11.5–15.5)
WBC: 3.7 10*3/uL — ABNORMAL LOW (ref 4.0–10.5)
nRBC: 6.2 % — ABNORMAL HIGH (ref 0.0–0.2)

## 2019-01-08 LAB — CBG MONITORING, ED
Glucose-Capillary: 57 mg/dL — ABNORMAL LOW (ref 70–99)
Glucose-Capillary: 75 mg/dL (ref 70–99)
Glucose-Capillary: 114 mg/dL — ABNORMAL HIGH (ref 70–99)

## 2019-01-08 LAB — C-REACTIVE PROTEIN: CRP: 6.1 mg/dL — ABNORMAL HIGH (ref <1.0)

## 2019-01-08 LAB — GLUCOSE, CAPILLARY
Glucose-Capillary: 121 mg/dL — ABNORMAL HIGH (ref 70–99)
Glucose-Capillary: 165 mg/dL — ABNORMAL HIGH (ref 70–99)
Glucose-Capillary: 226 mg/dL — ABNORMAL HIGH (ref 70–99)

## 2019-01-08 LAB — BRAIN NATRIURETIC PEPTIDE: B Natriuretic Peptide: 2480.7 pg/mL — ABNORMAL HIGH (ref 0.0–100.0)

## 2019-01-08 LAB — LACTIC ACID, PLASMA
Lactic Acid, Venous: 2.8 mmol/L (ref 0.5–1.9)
Lactic Acid, Venous: 2.2 mmol/L (ref 0.5–1.9)

## 2019-01-08 LAB — FERRITIN: Ferritin: 77 ng/mL (ref 24–336)

## 2019-01-08 MED ORDER — METHADONE HCL 10 MG PO TABS
30.0000 mg | ORAL_TABLET | Freq: Every day | ORAL | Status: DC
  Administered 2019-01-08: 30 mg via ORAL
  Filled 2019-01-08: qty 3

## 2019-01-08 MED ORDER — METHADONE HCL 5 MG PO TABS
15.0000 mg | ORAL_TABLET | Freq: Every day | ORAL | Status: DC
  Administered 2019-01-09: 15 mg via ORAL
  Filled 2019-01-08: qty 1

## 2019-01-08 MED ORDER — TECHNETIUM TO 99M ALBUMIN AGGREGATED
1.6000 | Freq: Once | INTRAVENOUS | Status: AC | PRN
  Administered 2019-01-08: 1.6 via INTRAVENOUS

## 2019-01-08 MED ORDER — ACETAMINOPHEN 650 MG RE SUPP: 650.0000 mg | Freq: Four times a day (QID) | RECTAL | Status: DC | PRN

## 2019-01-08 MED ORDER — ONDANSETRON HCL 4 MG/2ML IJ SOLN: 4.0000 mg | Freq: Four times a day (QID) | INTRAMUSCULAR | Status: DC | PRN

## 2019-01-08 MED ORDER — RIVAROXABAN 20 MG PO TABS
20.0000 mg | ORAL_TABLET | Freq: Every day | ORAL | Status: DC
  Administered 2019-01-08 – 2019-01-10 (×4): 20 mg via ORAL
  Filled 2019-01-08 (×6): qty 1

## 2019-01-08 MED ORDER — INSULIN ASPART 100 UNIT/ML ~~LOC~~ SOLN
0.0000 [IU] | SUBCUTANEOUS | Status: DC
  Administered 2019-01-08: 2 [IU] via SUBCUTANEOUS
  Administered 2019-01-09: 3 [IU] via SUBCUTANEOUS
  Administered 2019-01-09 (×2): 2 [IU] via SUBCUTANEOUS
  Administered 2019-01-09 – 2019-01-10 (×2): 1 [IU] via SUBCUTANEOUS
  Administered 2019-01-10: 3 [IU] via SUBCUTANEOUS
  Administered 2019-01-10: 1 [IU] via SUBCUTANEOUS
  Administered 2019-01-10 – 2019-01-13 (×3): 2 [IU] via SUBCUTANEOUS
  Administered 2019-01-13: 21:00:00 3 [IU] via SUBCUTANEOUS
  Administered 2019-01-13: 12:00:00 2 [IU] via SUBCUTANEOUS
  Administered 2019-01-13: 3 [IU] via SUBCUTANEOUS
  Administered 2019-01-14 (×4): 2 [IU] via SUBCUTANEOUS
  Administered 2019-01-14 – 2019-01-15 (×3): 3 [IU] via SUBCUTANEOUS
  Administered 2019-01-15 (×2): 2 [IU] via SUBCUTANEOUS

## 2019-01-08 MED ORDER — AZITHROMYCIN 500 MG PO TABS
500.0000 mg | ORAL_TABLET | Freq: Every day | ORAL | Status: DC
  Administered 2019-01-08 – 2019-01-09 (×2): 500 mg via ORAL
  Filled 2019-01-08: qty 2
  Filled 2019-01-08: qty 1

## 2019-01-08 MED ORDER — ATORVASTATIN CALCIUM 40 MG PO TABS
40.0000 mg | ORAL_TABLET | Freq: Every day | ORAL | Status: DC
  Administered 2019-01-08 (×2): 40 mg via ORAL
  Filled 2019-01-08 (×2): qty 1

## 2019-01-08 MED ORDER — ONDANSETRON HCL 4 MG PO TABS: 4.0000 mg | ORAL_TABLET | Freq: Four times a day (QID) | ORAL | Status: DC | PRN

## 2019-01-08 MED ORDER — SODIUM CHLORIDE 0.9 % IV SOLN
1.0000 g | INTRAVENOUS | Status: DC
  Administered 2019-01-08 – 2019-01-15 (×8): 1 g via INTRAVENOUS
  Filled 2019-01-08 (×3): qty 10
  Filled 2019-01-08 (×2): qty 1
  Filled 2019-01-08 (×3): qty 10

## 2019-01-08 MED ORDER — SODIUM CHLORIDE 0.9 % IV SOLN
200.0000 mg | Freq: Once | INTRAVENOUS | Status: AC
  Administered 2019-01-08: 200 mg via INTRAVENOUS
  Filled 2019-01-08: qty 40

## 2019-01-08 MED ORDER — LOSARTAN POTASSIUM 50 MG PO TABS
25.0000 mg | ORAL_TABLET | Freq: Every day | ORAL | Status: DC
  Administered 2019-01-08 – 2019-01-09 (×2): 25 mg via ORAL
  Filled 2019-01-08 (×2): qty 1

## 2019-01-08 MED ORDER — DEXAMETHASONE SODIUM PHOSPHATE 10 MG/ML IJ SOLN
6.0000 mg | INTRAMUSCULAR | Status: DC
  Administered 2019-01-08 – 2019-01-10 (×3): 6 mg via INTRAVENOUS
  Filled 2019-01-08 (×3): qty 1

## 2019-01-08 MED ORDER — ACETAMINOPHEN 325 MG PO TABS
650.0000 mg | ORAL_TABLET | Freq: Four times a day (QID) | ORAL | Status: DC | PRN
  Administered 2019-01-08 – 2019-01-09 (×2): 650 mg via ORAL
  Filled 2019-01-08 (×2): qty 2

## 2019-01-08 MED ORDER — AMLODIPINE BESYLATE 5 MG PO TABS
10.0000 mg | ORAL_TABLET | Freq: Every day | ORAL | Status: DC
  Administered 2019-01-08 – 2019-01-10 (×3): 10 mg via ORAL
  Filled 2019-01-08: qty 1
  Filled 2019-01-08 (×2): qty 2
  Filled 2019-01-08: qty 1

## 2019-01-08 MED ORDER — SODIUM CHLORIDE 0.9 % IV SOLN
100.0000 mg | Freq: Every day | INTRAVENOUS | Status: AC
  Administered 2019-01-09 – 2019-01-12 (×4): 100 mg via INTRAVENOUS
  Filled 2019-01-08 (×4): qty 20

## 2019-01-08 MED ORDER — CARVEDILOL 12.5 MG PO TABS
12.5000 mg | ORAL_TABLET | Freq: Two times a day (BID) | ORAL | Status: DC
  Administered 2019-01-08 – 2019-01-10 (×5): 12.5 mg via ORAL
  Filled 2019-01-08 (×6): qty 1

## 2019-01-08 NOTE — ED Notes (Signed)
Patient took off his oxygen and stated it was hurting. The patient was repositioned and refused to put the oxygen back on. The patient was educated that if he kept his oxygen off, he would continue to have trouble breathing. The patient said "no." The oxygen was put down and I stated "I can't make you wear your oxygen even though you need it. I want to help you. If you want me to help, hit your call button and I will be glad to help." The patient stated "get the f___ on." I left the room.

## 2019-01-08 NOTE — ED Notes (Signed)
This NT went into room to assist [place], Therapist, sports. Pt was able to transfer to the beside but stated he was too weak to transfer back. While assisting pt back to bed he lowered himself to his knee and stated he is too weak to help get back in the bed. This NT informed the pt that he would have to use a bedpan to use the bathroom for his safety as well as the staff.

## 2019-01-08 NOTE — ED Notes (Signed)
Pt lethargic but will answer limited questions.  MD in room as well.  Pt set up for breakfast but does not wake easily to eat or talk with MD.

## 2019-01-08 NOTE — Progress Notes (Signed)
ANTICOAGULATION CONSULT NOTE - Initial Consult  Pharmacy Consult for Xarelto  Indication: LV thrombus/Afib  Allergies  Allergen Reactions  . Amoxicillin-Pot Clavulanate Nausea And Vomiting    Did it involve swelling of the face/tongue/throat, SOB, or low BP? No Did it involve sudden or severe rash/hives, skin peeling, or any reaction on the inside of your mouth or nose? No Did you need to seek medical attention at a hospital or doctor's office? No When did it last happen?6 months ago If all above answers are "NO", may proceed with cephalosporin use.     Vital Signs: Temp: 97.3 F (36.3 C) (12/21 1828) Temp Source: Oral (12/21 1828) BP: 163/104 (12/21 2333) Pulse Rate: 94 (12/21 2333)  Labs: Recent Labs    12/18/2018 1943 12/25/2018 2139  HGB 14.8 15.6  HCT 48.1 46.0  PLT 267  --   CREATININE 1.54*  --     CrCl cannot be calculated (Unknown ideal weight.).   Medical History: Past Medical History:  Diagnosis Date  . Diabetes mellitus without complication ([place])   . Heart attack ([place])   . Hepatitis C   . HLD (hyperlipidemia)   . HTN (hypertension)   . Peripheral vascular disease ([place])   . Systolic CHF [facility])    Assessment: 58 y/o M, COVID+, here with increasing shortness of breath and weakness, recently started on Xarelto in September for LV thrombus/Afib, unsure of Xarelto compliance at home, CBC good.   Goal of Therapy:  Monitor platelets by anticoagulation protocol: Yes   Plan:  Xarelto 20 mg daily with supper Daily CBC while inpatient Monitor for bleeding  Narda Bonds, PharmD, BCPS Clinical Pharmacist Phone: [phone]

## 2019-01-08 NOTE — ED Notes (Signed)
ED TO INPATIENT HANDOFF REPORT  ED Nurse Name and Phone #: Celene Squibb RN  S Name/Age/Gender Daniel Moon 58 y.o. male Room/Bed: 007C/007C  Code Status   Code Status: Full Code  Home/SNF/Other Home Patient oriented to: self, place, time and situation Is this baseline? Yes   Triage Complete: Triage complete  Chief Complaint Acute respiratory failure with hypoxia ([place]) [J96.01]  Triage Note Pt bib ems c/o increasing weakness/sob after being dx with covid 3 days ago. Recent BKA approx 1 month ago. 4L [place] on arrival.  138/70, HR 98, RR20, 99% 4L.     Allergies Allergies  Allergen Reactions  . Amoxicillin-Pot Clavulanate Nausea And Vomiting    Did it involve swelling of the face/tongue/throat, SOB, or low BP? No Did it involve sudden or severe rash/hives, skin peeling, or any reaction on the inside of your mouth or nose? No Did you need to seek medical attention at a hospital or doctor's office? No When did it last happen?6 months ago If all above answers are "NO", may proceed with cephalosporin use.    Level of Care/Admitting Diagnosis ED Disposition    ED Disposition Condition Comment   Admit  Hospital Area: [facility] [[id number]]  Level of Care: Telemetry Medical [[id number]]  Covid Evaluation: Confirmed COVID Positive  Diagnosis: Acute respiratory [facility] with [id number]  Admitting Physician: Rise Patience [phone]  Attending Physician: Rise Patience [phone]  Estimated length of stay: past midnight tomorrow  Certification:: I certify this patient will need inpatient services for at least 2 midnights       B Medical/Surgery History Past Medical History:  Diagnosis Date  . Diabetes mellitus without complication ([place])   . Heart attack ([place])   . Hepatitis C   . HLD (hyperlipidemia)   . HTN (hypertension)   . Peripheral vascular disease ([place])   . Systolic CHF [facility])    Past Surgical History:  Procedure Laterality Date   . ABSCESS DRAINAGE    . AMPUTATION Left 10/02/2018   Procedure: left BELOW KNEE AMPUTATION;  Surgeon: Angelia Mould, MD;  Location: [facility];  Service: Vascular;  Laterality: Left;  . TEE WITHOUT CARDIOVERSION N/A 10/05/2018   Procedure: TRANSESOPHAGEAL ECHOCARDIOGRAM (TEE);  Surgeon: Jerline Pain, MD;  Location[facility] ENDOSCOPY;  Service: Cardiovascular;  Laterality: N/A;     A IV Location/Drains/Wounds Patient Lines/Drains/Airways Status   Active Line/Drains/Airways    Name:   Placement date:   Placement time:   Site:   Days:   Peripheral IV 12/27/2018 Right;Upper Arm   01/08/2019    1947    Arm   1   Incision (Closed) 10/02/18 Leg Left   10/02/18    1602     98          Intake/Output Last 24 hours  Intake/Output Summary (Last 24 hours) at 01/08/2019 1327 Last data filed at 01/08/2019 0200 Gross per 24 hour  Intake 430 ml  Output --  Net 430 ml    Labs/Imaging Results for orders placed or performed during the hospital encounter of 12/28/2018 (from the past 48 hour(s))  Blood Culture (routine x 2)     Status: None (Preliminary result)   Collection Time: 01/05/2019  7:20 PM   Specimen: BLOOD  Result Value Ref Range   Specimen Description BLOOD RIGHT ANTECUBITAL    Special Requests      BOTTLES DRAWN AEROBIC ONLY Blood Culture results may not be optimal due to an inadequate volume  of blood received in culture bottles   Culture      NO GROWTH < 12 HOURS Performed at [facility] [address] [place], [place] [address]    Report Status PENDING   Blood Culture (routine x 2)     Status: None (Preliminary result)   Collection Time: 12/23/2018  7:25 PM   Specimen: BLOOD  Result Value Ref Range   Specimen Description BLOOD RIGHT UPPER ARM    Special Requests      BOTTLES DRAWN AEROBIC AND ANAEROBIC Blood Culture results may not be optimal due to an inadequate volume of blood received in culture bottles   Culture      NO GROWTH < 12 HOURS Performed at [facility], [place] [address] [place], [place] [address]    Report Status PENDING   Lactic acid, plasma     Status: Abnormal   Collection Time: 12/20/2018  7:43 PM  Result Value Ref Range   Lactic Acid, Venous 3.0 (HH) 0.5 - 1.9 mmol/L    Comment: CRITICAL RESULT CALLED TO, READ BACK BY AND VERIFIED WITH: Eather Colas 2037 12/28/2018 WBOND Performed at [facility], [place] [address] [place], [place] [address]   CBC WITH DIFFERENTIAL     Status: Abnormal   Collection Time: 12/24/2018  7:43 PM  Result Value Ref Range   WBC 4.8 4.0 - 10.5 K/uL   RBC 7.14 (H) 4.22 - 5.81 MIL/uL   Hemoglobin 14.8 13.0 - 17.0 g/dL   HCT 48.1 39.0 - 52.0 %   MCV 67.4 (L) 80.0 - 100.0 fL   MCH 20.7 (L) 26.0 - 34.0 pg   MCHC 30.8 30.0 - 36.0 g/dL   RDW 22.4 (H) 11.5 - 15.5 %   Platelets 267 150 - 400 K/uL   nRBC 6.2 (H) 0.0 - 0.2 %   Neutrophils Relative % 56 %   Neutro Abs 2.8 1.7 - 7.7 K/uL   Lymphocytes Relative 31 %   Lymphs Abs 1.5 0.7 - 4.0 K/uL   Monocytes Relative 10 %   Monocytes Absolute 0.5 0.1 - 1.0 K/uL   Eosinophils Relative 1 %   Eosinophils Absolute 0.0 0.0 - 0.5 K/uL   Basophils Relative 1 %   Basophils Absolute 0.0 0.0 - 0.1 K/uL   Immature Granulocytes 1 %   Abs Immature Granulocytes 0.04 0.00 - 0.07 K/uL   Reactive, Benign Lymphocytes PRESENT    Polychromasia PRESENT    Target Cells PRESENT     Comment: Performed at [facility], [address], [place], [place] [address]  Comprehensive metabolic panel     Status: Abnormal   Collection Time: 12/27/2018  7:43 PM  Result Value Ref Range   Sodium 138 135 - 145 mmol/L   Potassium 5.2 (H) 3.5 - 5.1 mmol/L    Comment: HEMOLYSIS AT THIS LEVEL MAY AFFECT RESULT   Chloride 103 98 - 111 mmol/L   CO2 23 22 - 32 mmol/L   Glucose, Bld 78 70 - 99 mg/dL   BUN 19 6 - 20 mg/dL   Creatinine, Ser 1.54 (H) 0.61 - 1.24 mg/dL   Calcium 8.1 (L) 8.9 - 10.3 mg/dL   Total Protein 8.3 (H) 6.5 - 8.1 g/dL   Albumin 2.1 (L) 3.5 - 5.0 g/dL   AST 69 (H)  15 - 41 U/L   ALT 37 0 - 44 U/L   Alkaline Phosphatase 154 (H) 38 - 126 U/L   Total  Bilirubin 1.9 (H) 0.3 - 1.2 mg/dL   GFR calc non Af Amer 49 (L) >60 mL/min   GFR calc Af Amer 57 (L) >60 mL/min   Anion gap 12 5 - 15    Comment: Performed at [facility] [address], [place], [place] [address]  D-dimer, quantitative     Status: Abnormal   Collection Time: 01/10/2019  7:43 PM  Result Value Ref Range   D-Dimer, Quant 8.09 (H) 0.00 - 0.50 ug/mL-FEU    Comment: (NOTE) At the manufacturer cut-off of 0.50 ug/mL FEU, this assay has been documented to exclude PE with a sensitivity and negative predictive value of 97 to 99%.  At this time, this assay has not been approved by the FDA to exclude DVT/VTE. Results should be correlated with clinical presentation. Performed at [facility], [place] [address] [place], [place] [address]   Procalcitonin     Status: None   Collection Time: 12/20/2018  7:43 PM  Result Value Ref Range   Procalcitonin 0.20 ng/mL    Comment:        Interpretation: PCT (Procalcitonin) <= 0.5 ng/mL: Systemic infection (sepsis) is not likely. Local bacterial infection is possible. (NOTE)       Sepsis PCT Algorithm           Lower Respiratory Tract                                      Infection PCT Algorithm    ----------------------------     ----------------------------         PCT < 0.25 ng/mL                PCT < 0.10 ng/mL         Strongly encourage             Strongly discourage   discontinuation of antibiotics    initiation of antibiotics    ----------------------------     -----------------------------       PCT 0.25 - 0.50 ng/mL            PCT 0.10 - 0.25 ng/mL               OR       >80% decrease in PCT            Discourage initiation of                                            antibiotics      Encourage discontinuation           of antibiotics    ----------------------------     -----------------------------         PCT >= 0.50 ng/mL               PCT 0.26 - 0.50 ng/mL               AND        <80% decrease in PCT             Encourage initiation of  antibiotics       Encourage continuation           of antibiotics    ----------------------------     -----------------------------        PCT >= 0.50 ng/mL                  PCT > 0.50 ng/mL               AND         increase in PCT                  Strongly encourage                                      initiation of antibiotics    Strongly encourage escalation           of antibiotics                                     -----------------------------                                           PCT <= 0.25 ng/mL                                                 OR                                        > 80% decrease in PCT                                     Discontinue / Do not initiate                                             antibiotics Performed at [facility], [address] [place], [place] [address]   Lactate dehydrogenase     Status: Abnormal   Collection Time: 01/12/2019  7:43 PM  Result Value Ref Range   LDH 424 (H) 98 - 192 U/L    Comment: Performed at [facility], [place] [address], [place], [place] [address]  Ferritin     Status: None   Collection Time: 12/20/2018  7:43 PM  Result Value Ref Range   Ferritin 98 24 - 336 ng/mL    Comment: Performed at [facility] [address], [place], [place] [address]  Fibrinogen     Status: None   Collection Time: 01/01/2019  7:43 PM  Result Value Ref Range   Fibrinogen 346 210 - 475 mg/dL    Comment: Performed at [facility] [address] [place], [place] [address]  C-reactive protein     Status: Abnormal   Collection Time: 12/21/2018  7:43 PM  Result  Value Ref Range   CRP 7.6 (H) <1.0 mg/dL    Comment: Performed at [facility] [address], [place], [place] [address]  Triglycerides     Status: None   Collection Time: 12/27/2018  7:43 PM   Result Value Ref Range   Triglycerides 111 <150 mg/dL    Comment: Performed at [facility] [address], [place], [place] [address]  I-STAT 7, (LYTES, BLD GAS, ICA, H+H)     Status: Abnormal   Collection Time: 01/01/2019  9:39 PM  Result Value Ref Range   pH, Arterial 7.472 (H) 7.350 - 7.450   pCO2 arterial 32.9 32.0 - 48.0 mmHg   pO2, Arterial 51.0 (L) 83.0 - 108.0 mmHg   Bicarbonate 24.3 20.0 - 28.0 mmol/L   TCO2 25 22 - 32 mmol/L   O2 Saturation 90.0 %   Acid-Base Excess 1.0 0.0 - 2.0 mmol/L   Sodium 141 135 - 145 mmol/L   Potassium 4.3 3.5 - 5.1 mmol/L   Calcium, Ion 1.11 (L) 1.15 - 1.40 mmol/L   HCT 46.0 39.0 - 52.0 %   Hemoglobin 15.6 13.0 - 17.0 g/dL   Patient temperature 97.3 F    Collection site RADIAL, ALLEN'S TEST ACCEPTABLE    Drawn by RT    Sample type ARTERIAL   Lactic acid, plasma     Status: Abnormal   Collection Time: 01/08/2019  9:47 PM  Result Value Ref Range   Lactic Acid, Venous 2.3 (HH) 0.5 - 1.9 mmol/L    Comment: CRITICAL VALUE NOTED.  VALUE IS CONSISTENT WITH PREVIOUSLY REPORTED AND CALLED VALUE. Performed at [facility], [place] [address] [place], [place] [address]   CBG monitoring, ED     Status: Abnormal   Collection Time: 01/08/19  1:01 AM  Result Value Ref Range   Glucose-Capillary 57 (L) 70 - 99 mg/dL  CBG monitoring, ED     Status: None   Collection Time: 01/08/19  3:24 AM  Result Value Ref Range   Glucose-Capillary 75 70 - 99 mg/dL  CBG monitoring, ED     Status: Abnormal   Collection Time: 01/08/19  8:04 AM  Result Value Ref Range   Glucose-Capillary 114 (H) 70 - 99 mg/dL   Comment 1 QC Due   Comprehensive metabolic panel     Status: Abnormal   Collection Time: 01/08/19  8:13 AM  Result Value Ref Range   Sodium 137 135 - 145 mmol/L   Potassium 4.8 3.5 - 5.1 mmol/L   Chloride 102 98 - 111 mmol/L   CO2 24 22 - 32 mmol/L   Glucose, Bld 126 (H) 70 - 99 mg/dL   BUN 19 6 - 20 mg/dL   Creatinine, Ser 1.52 (H) 0.61 - 1.24 mg/dL    Calcium 8.0 (L) 8.9 - 10.3 mg/dL   Total Protein 6.8 6.5 - 8.1 g/dL   Albumin 1.7 (L) 3.5 - 5.0 g/dL   AST 48 (H) 15 - 41 U/L   ALT 27 0 - 44 U/L   Alkaline Phosphatase 129 (H) 38 - 126 U/L   Total Bilirubin 1.4 (H) 0.3 - 1.2 mg/dL   GFR calc non Af Amer 50 (L) >60 mL/min   GFR calc Af Amer 58 (L) >60 mL/min   Anion gap 11 5 - 15    Comment: Performed at [facility], [address], [place], [place] [address]  CBC with Differential/Platelet     Status: Abnormal  Collection Time: 01/08/19  8:13 AM  Result Value Ref Range   WBC 3.7 (L) 4.0 - 10.5 K/uL    Comment: REPEATED TO VERIFY WHITE COUNT CONFIRMED ON SMEAR    RBC 6.23 (H) 4.22 - 5.81 MIL/uL   Hemoglobin 12.9 (L) 13.0 - 17.0 g/dL   HCT 41.7 39.0 - 52.0 %   MCV 66.9 (L) 80.0 - 100.0 fL   MCH 20.7 (L) 26.0 - 34.0 pg   MCHC 30.9 30.0 - 36.0 g/dL   RDW 21.4 (H) 11.5 - 15.5 %   Platelets 233 150 - 400 K/uL    Comment: REPEATED TO VERIFY   nRBC 6.2 (H) 0.0 - 0.2 %   Neutrophils Relative % 78 %   Neutro Abs 2.9 1.7 - 7.7 K/uL   Lymphocytes Relative 15 %   Lymphs Abs 0.5 (L) 0.7 - 4.0 K/uL   Monocytes Relative 5 %   Monocytes Absolute 0.2 0.1 - 1.0 K/uL   Eosinophils Relative 0 %   Eosinophils Absolute 0.0 0.0 - 0.5 K/uL   Basophils Relative 1 %   Basophils Absolute 0.0 0.0 - 0.1 K/uL   Immature Granulocytes 1 %   Abs Immature Granulocytes 0.05 0.00 - 0.07 K/uL    Comment: Performed at [facility], [place] [address] [place], [place] [address]  C-reactive protein     Status: Abnormal   Collection Time: 01/08/19  8:13 AM  Result Value Ref Range   CRP 6.1 (H) <1.0 mg/dL    Comment: Performed at [facility] [address], [place], [place] [address]  Ferritin     Status: None   Collection Time: 01/08/19  8:13 AM  Result Value Ref Range   Ferritin 77 24 - 336 ng/mL    Comment: Performed at [facility] [address], [place], [place] [address]  Lactic acid, plasma     Status: Abnormal    Collection Time: 01/08/19  8:13 AM  Result Value Ref Range   Lactic Acid, Venous 2.8 (HH) 0.5 - 1.9 mmol/L    Comment: CRITICAL VALUE NOTED.  VALUE IS CONSISTENT WITH PREVIOUSLY REPORTED AND CALLED VALUE. Performed at [facility], [place] [address], [place], [place] [address]   Brain natriuretic peptide     Status: Abnormal   Collection Time: 01/08/19  8:13 AM  Result Value Ref Range   B Natriuretic Peptide 2,480.7 (H) 0.0 - 100.0 pg/mL    Comment: Performed at [facility] [address] [place], [place] [address]   CT Head Wo Contrast  Result Date: 01/08/2019 CLINICAL DATA:  58 year old male with encephalopathy. EXAM: CT HEAD WITHOUT CONTRAST TECHNIQUE: Contiguous axial images were obtained from the base of the skull through the vertex without intravenous contrast. COMPARISON:  None. FINDINGS: Brain: The ventricles and sulci appropriate size for patient's age. Minimal periventricular and deep white matter chronic microvascular ischemic changes noted. There is a focal area of old infarct and encephalomalacia in the left cerebellar hemisphere. There is no acute intracranial hemorrhage. No mass effect or midline shift. No extra-axial fluid collection. Vascular: No hyperdense vessel or unexpected calcification. Skull: Normal. Negative for fracture or focal lesion. Sinuses/Orbits: Partial opacification of the left maxillary sinus. The remainder of the visualized paranasal sinuses and mastoid air cells are clear. Other: None IMPRESSION: 1. No acute intracranial hemorrhage. 2. Mild age-related atrophy and chronic microvascular ischemic changes. Old left cerebellar infarct. Electronically Signed   By: Laren Everts.D.  On: 12/27/2018 23:07   CT CHEST WO CONTRAST  Result Date: 01/08/2019 CLINICAL DATA:  Shortness of breath.  COVID-19 positive EXAM: CT CHEST WITHOUT CONTRAST TECHNIQUE: Multidetector CT imaging of the chest was performed following the standard protocol without IV  contrast. COMPARISON:  Chest radiograph January 07, 2019 FINDINGS: Cardiovascular: There is no thoracic aortic aneurysm on this noncontrast enhanced study. There are scattered foci of calcification in visualized great vessels. There are foci of aortic atherosclerosis. There are occasional foci of coronary artery calcification. There is no pericardial effusion or pericardial thickening evident. The main pulmonary outflow tract is prominent, measuring 3.3 cm in diameter. Mediastinum/Nodes: Thyroid appears unremarkable. There are scattered prominent mediastinal lymph nodes. There is a lymph node anterior to the carina. On the right measuring 1.5 x 1.4 cm. There is a lymph node adjacent to the aortic arch on the left measuring 1.5 x 1.3 cm. A second lymph node adjacent to the aortic arch measures 1.5 x 1.3 cm. There is a lymph node in the aortopulmonary window region measuring 1.6 x 1.3 cm. There is a subcarinal lymph node measuring 1.5 x 1.5 cm. No esophageal lesions are evident. Lungs/Pleura: There are moderate free-flowing pleural effusions bilaterally. There is mild loculation in the right pleural effusion. There is compressive atelectasis in each lower lobe. There is widespread airspace opacity throughout the lungs bilaterally, with consolidation in a portion of the posterior segment left upper lobe. Other areas of opacity have a more ground-glass type appearance. Upper Abdomen: There is a degree of hepatic steatosis. Visualized upper abdominal structures otherwise appear unremarkable. Musculoskeletal: There is degenerative change in the thoracic spine. There are no blastic or lytic bone lesions. No chest wall lesions are evident. IMPRESSION: 1. Multifocal airspace disease bilaterally, primarily with ground-glass type opacity, although there is consolidation in a portion of the posterior segment of the left upper lobe. Suspect atypical organism pneumonia at least partially responsible for the findings. 2. Moderate  pleural effusions bilaterally, slightly larger on the right than on the left with mild loculation on the right. Most of the effusions bilaterally are free-flowing. There is compressive atelectasis in each posterior lung base. 3. Several enlarged lymph nodes of uncertain etiology. Adenopathy may be of reactive/inflammatory etiology secondary to the extensive abnormality in the lung parenchyma. 4. Enlargement of the main pulmonary outflow tract, a finding indicative of underlying pulmonary arterial hypertension. 5.  Hepatic steatosis. 6. Foci of aortic atherosclerosis as well as great vessel and coronary artery calcification. Aortic Atherosclerosis (ICD10-I70.0). Electronically Signed   By: Lowella Grip III M.D.   On: 01/08/2019 08:48   DG Chest Port 1 View  Result Date: 12/21/2018 CLINICAL DATA:  Shortness of breath, COVID-19 positive, recent BKA EXAM: PORTABLE CHEST 1 VIEW COMPARISON:  Radiograph 01/04/2019 FINDINGS: Redemonstration of the bilateral interstitial and airspace opacities throughout both lungs similar to slightly increased accounting for differences in technique. There is a moderate right pleural effusion with some features suggesting loculation. Suspect some left effusion as well. No pneumothorax. Cardiomediastinal contours are partially obscured by overlying opacity but grossly similar accounting for differences in technique and patient rotation. No acute osseous or soft tissue abnormality. Degenerative changes are present in the imaged spine and shoulders. IMPRESSION: 1. Redemonstration of the bilateral interstitial and airspace opacities, similar to slightly increased in the interval. Compatible with multifocal pneumonia. 2. Moderate right pleural effusion with some features suggesting loculation. Suspect trace left effusion as well. Electronically Signed   By: Elwin Sleight.D.  On: 01/06/2019 19:33    Pending Labs Unresulted Labs (From admission, onward)    Start     Ordered    01/08/19 0747  Lactic acid, plasma  STAT Now then every 3 hours,   R (with STAT occurrences)    Question:  Specimen collection method  Answer:  IV Team=IV Team collect   01/08/19 0746          Vitals/Pain Today's Vitals   01/08/19 1200 01/08/19 1230 01/08/19 1300 01/08/19 1310  BP: (!) 156/106  (!) 139/103 (!) 139/103  Pulse: 66 70 67   Resp: (!) 22 11 (!) 25   Temp:      TempSrc:      SpO2: 93% 97% 97%   PainSc:        Isolation Precautions Airborne and Contact precautions  Medications Medications  amLODipine (NORVASC) tablet 10 mg (10 mg Oral Given 01/08/19 1310)  atorvastatin (LIPITOR) tablet 40 mg (40 mg Oral Given 01/08/19 0121)  carvedilol (COREG) tablet 12.5 mg (12.5 mg Oral Given 01/08/19 1310)  losartan (COZAAR) tablet 25 mg (25 mg Oral Given 01/08/19 1311)  acetaminophen (TYLENOL) tablet 650 mg (650 mg Oral Given 01/08/19 1309)    Or  acetaminophen (TYLENOL) suppository 650 mg ( Rectal See Alternative 01/08/19 1309)  ondansetron (ZOFRAN) tablet 4 mg (has no administration in time range)    Or  ondansetron (ZOFRAN) injection 4 mg (has no administration in time range)  insulin aspart (novoLOG) injection 0-9 Units (0 Units Subcutaneous Not Given 01/08/19 1251)  remdesivir 200 mg in sodium chloride 0.9% 250 mL IVPB (0 mg Intravenous Stopped 01/08/19 0200)    Followed by  remdesivir 100 mg in sodium chloride 0.9 % 100 mL IVPB (has no administration in time range)  dexamethasone (DECADRON) injection 6 mg (6 mg Intravenous Given 01/08/19 0121)  rivaroxaban (XARELTO) tablet 20 mg (20 mg Oral Given 01/08/19 0121)  methadone (DOLOPHINE) tablet 15 mg (has no administration in time range)  cefTRIAXone (ROCEPHIN) 1 g in sodium chloride 0.9 % 100 mL IVPB (1 g Intravenous New Bag/Given 01/08/19 1313)  azithromycin (ZITHROMAX) tablet 500 mg (has no administration in time range)  0.9 %  sodium chloride infusion ( Intravenous Stopped 12/18/2018 2255)    Mobility walks with  device Moderate fall risk   Focused Assessments Pulmonary Assessment Handoff:  Lung sounds: Bilateral Breath Sounds: Clear O2 Device: Nasal Cannula O2 Flow Rate (L/min): 2 L/min      R Recommendations: See Admitting Provider Note  Report given to:   Additional Notes:

## 2019-01-08 NOTE — H&P (Signed)
History and Physical    Daniel Moon [id number] DOB: Feb 18, 1960 DOA: 01/16/2019  PCP: Sanjuana Letters, MD  Patient coming from: Home.  Chief Complaint: Shortness of breath.  HPI: Daniel Moon is a 58 y.o. male with history of diabetes mellitus type 2, systolic heart failure recently admitted in September 2020 for left below-knee amputation stump infection during which patient has MRSA bacteremia at that time patient had TEE done which showed left ventricular thrombus and EF was 30 to 35% had come to the ER 2 days ago with complaint of shortness of breath and left leg pain.  Covid test was positive but at that time patient wanted to go back home and was given instruction.  Today EMS was called because patient became more short of breath and was brought to the ER.  ED Course: The ER and on my exam and ER physician exam patient mildly lethargic but awakens and answers appropriately.  CT head is unremarkable and ABG does not show any carbon dioxide retention but does show hypoxia.  Chest x-ray shows pleural effusion with possible loculation in bilateral interstitial pneumonia.  Patient is having requirement of oxygen between 3 to 4 L but patient is not keeping it on clearly.  Labs show potassium of 5.2 creatinine 1.4 CRP 1.6 procalcitonin 0.2 D-dimer 8.09.  Blood cultures were obtained and patient admitted for acute respiratory failure with hypoxia and acute encephalopathy from COVID-19 infection.  Review of Systems: As per HPI, rest all negative.   Past Medical History:  Diagnosis Date  . Diabetes mellitus without complication ([place])   . Heart attack ([place])   . Hepatitis C   . HLD (hyperlipidemia)   . HTN (hypertension)   . Peripheral vascular disease ([place])   . Systolic CHF [facility])     Past Surgical History:  Procedure Laterality Date  . ABSCESS DRAINAGE    . AMPUTATION Left 10/02/2018   Procedure: left BELOW KNEE AMPUTATION;  Surgeon: Angelia Mould, MD;  Location:  [place];  Service: Vascular;  Laterality: Left;  . TEE WITHOUT CARDIOVERSION N/A 10/05/2018   Procedure: TRANSESOPHAGEAL ECHOCARDIOGRAM (TEE);  Surgeon: Jerline Pain, MD;  Location[facility] ENDOSCOPY;  Service: Cardiovascular;  Laterality: N/A;     reports that he has been smoking. He has never used smokeless tobacco. He reports current alcohol use. He reports current drug use.  Allergies  Allergen Reactions  . Amoxicillin-Pot Clavulanate Nausea And Vomiting    Did it involve swelling of the face/tongue/throat, SOB, or low BP? No Did it involve sudden or severe rash/hives, skin peeling, or any reaction on the inside of your mouth or nose? No Did you need to seek medical attention at a hospital or doctor's office? No When did it last happen?6 months ago If all above answers are "NO", may proceed with cephalosporin use.    Family History  Problem Relation Age of Onset  . Cancer Father   . Hypertension Father   . Heart disease Father   . Diabetes Father   . Stroke Sister   . Stroke Brother     Prior to Admission medications   Medication Sig Start Date End Date Taking? Authorizing Provider  amLODipine (NORVASC) 10 MG tablet Take 1 tablet (10 mg total) by mouth daily. 10/12/18   Regalado, Belkys A, MD  atorvastatin (LIPITOR) 40 MG tablet Take 1 tablet (40 mg total) by mouth at bedtime. 10/12/18   Regalado, Belkys A, MD  carvedilol (COREG) 12.5 MG tablet Take 1  tablet (12.5 mg total) by mouth 2 (two) times daily. 10/12/18   Regalado, Belkys A, MD  ferrous sulfate 325 (65 FE) MG tablet Take 1 tablet (325 mg total) by mouth daily with breakfast. 10/12/18   Regalado, Belkys A, MD  gabapentin (NEURONTIN) 300 MG capsule Take 2 capsules (600 mg total) by mouth 3 (three) times daily. 10/12/18   Regalado, Belkys A, MD  insulin NPH Human (HUMULIN N) 100 UNIT/ML injection Inject 0.02 mLs (2 Units total) into the skin 2 (two) times daily before a meal. 10/12/18   Regalado, Belkys A, MD  losartan (COZAAR) 25  MG tablet Take 1 tablet (25 mg total) by mouth daily. 10/12/18   Regalado, Belkys A, MD  nicotine (NICODERM CQ - DOSED IN MG/24 HOURS) 21 mg/24hr patch Place 1 patch (21 mg total) onto the skin daily. Patient not taking: Reported on 09/30/2018 02/24/18 02/24/19  Bettey Costa, MD  polyethylene glycol (MIRALAX / GLYCOLAX) 17 g packet Take 17 g by mouth daily. 10/12/18   Regalado, Cassie Freer, MD  Rivaroxaban 15 & 20 MG TBPK Follow package directions: Take one 15mg  tablet by mouth twice a day. On day 22, switch to one 20mg  tablet once a day. Take with food. 10/12/18   Regalado, Belkys A, MD  senna-docusate (SENOKOT-S) 8.6-50 MG tablet Take 2 tablets by mouth 2 (two) times daily. 10/12/18   Regalado, Cassie Freer, MD    Physical Exam: Constitutional: Moderately built and nourished. Vitals:   01/11/2019 2130 01/12/2019 2200 01/09/2019 2206 01/03/2019 2333  BP:   (!) 160/112 (!) 163/104  Pulse:  94 95 94  Resp: 20 19 (!) 28 (!) 22  Temp:      TempSrc:      SpO2:  92% 91% 94%   Eyes: Nonicteric no pallor. ENMT: No discharge from the ears eyes nose or mouth. Neck: No mass felt.  JVD elevated. Respiratory: No rhonchi or crepitations. Cardiovascular: S1 S2 heard. Abdomen: Soft nontender bowel sounds present. Musculoskeletal: Left BKA. Skin: No obvious rash. Neurologic: Patient is very lethargic but easily arousable.  Moves all extremities. Psychiatric: Mildly lethargic.   Labs on Admission: I have personally reviewed following labs and imaging studies  CBC: Recent Labs  Lab 01/11/2019 1943 12/29/2018 2139  WBC 4.8  --   NEUTROABS 2.8  --   HGB 14.8 15.6  HCT 48.1 46.0  MCV 67.4*  --   PLT 267  --    Basic Metabolic Panel: Recent Labs  Lab 01/10/2019 1943 12/25/2018 2139  NA 138 141  K 5.2* 4.3  CL 103  --   CO2 23  --   GLUCOSE 78  --   BUN 19  --   CREATININE 1.54*  --   CALCIUM 8.1*  --    GFR: CrCl cannot be calculated (Unknown ideal weight.). Liver Function Tests: Recent Labs  Lab  01/03/2019 1943  AST 69*  ALT 37  ALKPHOS 154*  BILITOT 1.9*  PROT 8.3*  ALBUMIN 2.1*   No results for input(s): LIPASE, AMYLASE in the last 168 hours. No results for input(s): AMMONIA in the last 168 hours. Coagulation Profile: No results for input(s): INR, PROTIME in the last 168 hours. Cardiac Enzymes: No results for input(s): CKTOTAL, CKMB, CKMBINDEX, TROPONINI in the last 168 hours. BNP (last 3 results) No results for input(s): PROBNP in the last 8760 hours. HbA1C: No results for input(s): HGBA1C in the last 72 hours. CBG: Recent Labs  Lab 01/04/19 1109  GLUCAP 110*  Lipid Profile: Recent Labs    01/17/2019 1943  TRIG 111   Thyroid Function Tests: No results for input(s): TSH, T4TOTAL, FREET4, T3FREE, THYROIDAB in the last 72 hours. Anemia Panel: Recent Labs    01/06/2019 1943  FERRITIN 98   Urine analysis:    Component Value Date/Time   COLORURINE AMBER (A) 09/30/2018 0051   APPEARANCEUR CLOUDY (A) 09/30/2018 0051   LABSPEC 1.024 09/30/2018 0051   PHURINE 5.0 09/30/2018 0051   GLUCOSEU NEGATIVE 09/30/2018 0051   HGBUR NEGATIVE 09/30/2018 0051   BILIRUBINUR NEGATIVE 09/30/2018 0051   KETONESUR NEGATIVE 09/30/2018 0051   PROTEINUR 100 (A) 09/30/2018 0051   NITRITE NEGATIVE 09/30/2018 0051   LEUKOCYTESUR NEGATIVE 09/30/2018 0051   Sepsis Labs: @LABRCNTIP (procalcitonin:4,lacticidven:4) )No results found for this or any previous visit (from the past 240 hour(s)).   Radiological Exams on Admission: CT Head Wo Contrast  Result Date: 01/08/2019 CLINICAL DATA:  58 year old male with encephalopathy. EXAM: CT HEAD WITHOUT CONTRAST TECHNIQUE: Contiguous axial images were obtained from the base of the skull through the vertex without intravenous contrast. COMPARISON:  None. FINDINGS: Brain: The ventricles and sulci appropriate size for patient's age. Minimal periventricular and deep white matter chronic microvascular ischemic changes noted. There is a focal area of  old infarct and encephalomalacia in the left cerebellar hemisphere. There is no acute intracranial hemorrhage. No mass effect or midline shift. No extra-axial fluid collection. Vascular: No hyperdense vessel or unexpected calcification. Skull: Normal. Negative for fracture or focal lesion. Sinuses/Orbits: Partial opacification of the left maxillary sinus. The remainder of the visualized paranasal sinuses and mastoid air cells are clear. Other: None IMPRESSION: 1. No acute intracranial hemorrhage. 2. Mild age-related atrophy and chronic microvascular ischemic changes. Old left cerebellar infarct. Electronically Signed   By: Anner Crete M.D.   On: 01/17/2019 23:07   DG Chest Port 1 View  Result Date: 12/22/2018 CLINICAL DATA:  Shortness of breath, COVID-19 positive, recent BKA EXAM: PORTABLE CHEST 1 VIEW COMPARISON:  Radiograph 01/04/2019 FINDINGS: Redemonstration of the bilateral interstitial and airspace opacities throughout both lungs similar to slightly increased accounting for differences in technique. There is a moderate right pleural effusion with some features suggesting loculation. Suspect some left effusion as well. No pneumothorax. Cardiomediastinal contours are partially obscured by overlying opacity but grossly similar accounting for differences in technique and patient rotation. No acute osseous or soft tissue abnormality. Degenerative changes are present in the imaged spine and shoulders. IMPRESSION: 1. Redemonstration of the bilateral interstitial and airspace opacities, similar to slightly increased in the interval. Compatible with multifocal pneumonia. 2. Moderate right pleural effusion with some features suggesting loculation. Suspect trace left effusion as well. Electronically Signed   By: Lovena Le M.D.   On: 12/27/2018 19:33    EKG: Independently reviewed.  Normal sinus rhythm.  Assessment/Plan Principal Problem:   Acute respiratory failure with hypoxia (HCC) Active  Problems:   HTN (hypertension)   COPD (chronic obstructive pulmonary disease) (HCC)   PAD (peripheral artery disease) (HCC)   Chronic renal insufficiency, stage 3 (moderate)   Paroxysmal atrial fibrillation ([place])   COVID-19 virus infection    1. Acute respiratory failure with hypoxia likely from Covid infection for which I have placed patient on IV Decadron and remdesivir.  Given that there is a possibility of loculated pleural effusion I have ordered CT chest.  Given the marked elevated D-dimer will order VQ scan. 2. Acute encephalopathy/lethargy likely secondary to hypoxia.  CT head unremarkable patient is questions appropriately.  Will be related to drug abuse.  Patient admits to taking heroin recently.  Closely monitor. 3. Recently diagnosed LV thrombus on Xarelto.  Patient states he has been compliant. 4. Chronic kidney disease with mild hyperkalemia patient did receive fluids in the ER.  Will hold off any further fluid with history of systolic heart failure.  If potassium tends to remain high will need to hold ARB. 5. Chronic systolic heart failure last EF measured recently was 30 to 35% on ARB Coreg.  May need to hold ARB if potassium remains high.  I am checking patient's BNP.  May need Lasix if continues to remain hypoxic 6. Hypertension on Coreg ARB and amlodipine. 7. Drug abuse check urine drug screen.  Patient admits to taking heroin recently on methadone as confirmed with pharmacy.. 8. Patient admitted for left BKA stump infection with MRSA.  Follow blood cultures.  Patient states he still takes IV drugs. 9. Diabetes mellitus type 2 we will keep patient on sliding scale coverage.   Given that patient has acute respiratory failure with hypoxia and Covid infection will need inpatient status.  DVT prophylaxis: Xarelto. Code Status: Full code. Family Communication: Unable to reach family with the number provided. Disposition Plan: To be determined. Consults called: None. Admission  status: Inpatient.   Rise Patience MD [facility] Pager [phone].  If 7PM-7AM, please contact night-coverage [email or web address] Password [facility]  01/08/2019, 12:34 AM

## 2019-01-08 NOTE — ED Notes (Signed)
Remains drowsy, but able to rouse with confusion and weakness noted.,  Able to follow directions and take large drinks of water.

## 2019-01-08 NOTE — ED Notes (Signed)
Breakfast Ordered 

## 2019-01-08 NOTE — ED Notes (Signed)
Patient continued to call out. The patient was re-educated regarding the use of the nurse call bell. Humidified oxygen was applied because the patient stated the oxygen hurt his nose. The patient was  Given a warm blanket and repositioned to a position of comfort. The patient is now resting comfortably.

## 2019-01-08 NOTE — ED Notes (Signed)
Patient is continuously calling out for help. When I dress out and enter the room to assist,  the patient states he needs help but will not elaborate. Patient is irritable and noncompliant with oxygen or being attached to the monitor. Every time I enter the room, I reattach him to the monitor.

## 2019-01-08 NOTE — ED Notes (Signed)
ED tech Tonia Ghent came into the room with the nurse to attempt to help the patient. Oxygen and SPO2 monitoring was placed back onto the patient and Tonia Ghent explained that the ED staff is only trying to help. The patient stated "I do not want to talk to you." The patient was asked where his pain was and the patient refused to tell where his pain was. ED staff left the room.

## 2019-01-08 NOTE — Progress Notes (Signed)
Pt's mom, Rogelia Mire, said Carlous cannot come back to her home.  She cannot care for him any longer.  She wants him put in a home somewhere.  I told her that he is not anywhere near ready for discharge, but physical therapy and social work would help with the discharge planning.

## 2019-01-08 NOTE — ED Notes (Signed)
returns with RN from [place].  Restful without distress, will hold on breakfast.  Pt currently compliant with oxygen and VS equip.

## 2019-01-08 NOTE — ED Notes (Signed)
Lunch Tray Ordered @ 1124. 

## 2019-01-08 NOTE — Progress Notes (Signed)
I agree with plan of care, history and physical and assessment and plan as per my partner  58 year old M-prior heroin abuse, DM TY 2, PVD + angioplasty left hip arteries/PCI distal SFA, transmet amputation left side, prior MRSA bacteremia status post BKA [id number], systolic heart failure [id number] Left ventricular thrombus Xarelto, HTN, HLD, peripheral neuropathy, anemia chronic disease, COPD, HTN, tobacco habituation  Presented to the emergency room initially 12/18 but wanted to go home recommended strict isolation usual management-represented metabolic encephalopathy short of breath 12/21 ABG showed alkalosis and significant hypoxia to the 50s-secondary to lethargy CT head was done which showed chronic microvascular changes inflammatory markers CRP 7.6 Lactic acid down from 3.0-2.3 Procalcitonin 0.20 Ferritin 98 BUN/creatinine up from baseline of 9/1.3-->19/1.5 slight elevation of LFT AST 69/37 No white count  Started on remdesivir, Decadron  Quite sleepy and seems a little confused in the emergency room was not keeping on oxygen earlier Chest clinically seems clear Work of breathing slightly increased-needing about 2 L of oxygen  Patient will complete treatment with remdesivir Decadron in the hospital I will cut back as methadone as he is slightly confused from 30-15 Expect he will need to complete treatment over the next several days--- CT shows pulmonary opacities will given his WBC being low start ceftriaxone azithromycin cycle procalcitonin to be cycled to determine if we can de-escalate antibiotics   Verneita Griffes, MD [facility] 10:28 AM

## 2019-01-09 ENCOUNTER — Inpatient Hospital Stay (HOSPITAL_COMMUNITY): Payer: Medicaid Other

## 2019-01-09 DIAGNOSIS — I5021 Acute systolic (congestive) heart failure: Secondary | ICD-10-CM

## 2019-01-09 DIAGNOSIS — I10 Essential (primary) hypertension: Secondary | ICD-10-CM

## 2019-01-09 LAB — BLOOD GAS, ARTERIAL
Acid-Base Excess: 1 mmol/L (ref 0.0–2.0)
Bicarbonate: 25.6 mmol/L (ref 20.0–28.0)
FIO2: 32
O2 Saturation: 93.4 %
Patient temperature: 35.6
pCO2 arterial: 41.1 mmHg (ref 32.0–48.0)
pH, Arterial: 7.402 (ref 7.350–7.450)
pO2, Arterial: 70.1 mmHg — ABNORMAL LOW (ref 83.0–108.0)

## 2019-01-09 LAB — GLUCOSE, CAPILLARY
Glucose-Capillary: 137 mg/dL — ABNORMAL HIGH (ref 70–99)
Glucose-Capillary: 139 mg/dL — ABNORMAL HIGH (ref 70–99)
Glucose-Capillary: 139 mg/dL — ABNORMAL HIGH (ref 70–99)
Glucose-Capillary: 166 mg/dL — ABNORMAL HIGH (ref 70–99)
Glucose-Capillary: 192 mg/dL — ABNORMAL HIGH (ref 70–99)
Glucose-Capillary: 231 mg/dL — ABNORMAL HIGH (ref 70–99)

## 2019-01-09 LAB — MRSA PCR SCREENING: MRSA by PCR: NEGATIVE

## 2019-01-09 MED ORDER — FUROSEMIDE 10 MG/ML IJ SOLN
40.0000 mg | Freq: Once | INTRAMUSCULAR | Status: AC
  Administered 2019-01-09: 40 mg via INTRAVENOUS
  Filled 2019-01-09: qty 4

## 2019-01-09 MED ORDER — ENSURE ENLIVE PO LIQD
237.0000 mL | Freq: Two times a day (BID) | ORAL | Status: DC
  Administered 2019-01-10 (×2): 237 mL via ORAL

## 2019-01-09 MED ORDER — PRO-STAT SUGAR FREE PO LIQD
30.0000 mL | Freq: Two times a day (BID) | ORAL | Status: DC
  Administered 2019-01-10: 08:00:00 30 mL via ORAL
  Filled 2019-01-09 (×2): qty 30

## 2019-01-09 MED ORDER — DOXYCYCLINE HYCLATE 100 MG PO TABS
100.0000 mg | ORAL_TABLET | Freq: Two times a day (BID) | ORAL | Status: DC
  Administered 2019-01-09 – 2019-01-10 (×2): 100 mg via ORAL
  Filled 2019-01-09 (×5): qty 1

## 2019-01-09 MED ORDER — METHADONE HCL 10 MG PO TABS
10.0000 mg | ORAL_TABLET | Freq: Every day | ORAL | Status: DC
  Administered 2019-01-10: 10 mg via ORAL
  Filled 2019-01-09: qty 1

## 2019-01-09 MED ORDER — TOCILIZUMAB 400 MG/20ML IV SOLN
8.0000 mg/kg | Freq: Once | INTRAVENOUS | Status: AC
  Administered 2019-01-09: 650 mg via INTRAVENOUS
  Filled 2019-01-09: qty 32.5

## 2019-01-09 MED ORDER — LORAZEPAM 2 MG/ML IJ SOLN
1.0000 mg | Freq: Once | INTRAMUSCULAR | Status: AC
  Administered 2019-01-09: 17:00:00 1 mg via INTRAVENOUS
  Filled 2019-01-09: qty 1

## 2019-01-09 MED ORDER — ADULT MULTIVITAMIN W/MINERALS CH
1.0000 | ORAL_TABLET | Freq: Every day | ORAL | Status: DC
  Administered 2019-01-10 – 2019-01-19 (×7): 1 via ORAL
  Filled 2019-01-09 (×8): qty 1

## 2019-01-09 NOTE — Progress Notes (Signed)
Spoke with rapid response nurse Mindy, updated patient not responding to questions, not following commands. Patient will open eyes to pain. Temp of 96.66F, BP 107/60, HR 60, RR 18-20. Patient noted to have short spouts of apnea between 10-15 seconds. On 2L [place] at 95%. Mindy to place orders for ABG, CBC, BMP. Mindy with hospitalist and states she will updated hospitalist. Will continue to monitor.

## 2019-01-09 NOTE — Progress Notes (Signed)
Already made MD aware of patients low temperature rectally. Patient now has bare hugger

## 2019-01-09 NOTE — Significant Event (Addendum)
Rapid Response Event Note  Overview: Hypothermia - Rectal Temp 95  Received a call from nurse with concerns of patient having temperature of 95 rectally while being on a bair hugger. Per nurse, patient became anxious and stated that he was too hot and is uncomfortable with the heat from the bair hugger. HR 58-60 - has been since about 1400 yesterday. SBP 90 -- 100s, patient answers questions and follow commands, states that his abdomen is hurting and that he feels like he has urinary retention but then quickly falls asleep. Lung sounds - diminished throughout, 97 on 3L [place], not in acute distress but anxious at times.   Continue to warm the patient for now, perhaps localize heat to his legs or however he tolerates it. The room was quite cool to me. Recheck temperature in an hour and notify MD. Bladder scan ~ 150 cc, also asked nurse to notify MD about retention symptoms and discomfort.    Hae Ahlers R

## 2019-01-09 NOTE — Progress Notes (Signed)
Initial Nutrition Assessment  RD working remotely.  DOCUMENTATION CODES:   Not applicable  INTERVENTION:   - Liberalize diet to Regular to promote PO intake, verbal with readback order placed per MD  - Ensure Enlive po BID, each supplement provides 350 kcal and 20 grams of protein  - Pro-stat 30 ml po BID, each supplement provides 100 kcal and 15 grams of protein  - MVI with minerals daily  NUTRITION DIAGNOSIS:   Increased nutrient needs related to acute illness (COVID-19) as evidenced by estimated needs.  GOAL:   Patient will meet greater than or equal to 90% of their needs  MONITOR:   PO intake, Supplement acceptance, Labs, Weight trends, Skin  REASON FOR ASSESSMENT:   Consult Assessment of nutrition requirement/status  ASSESSMENT:   58 year old male who presented to the ED on 12/21 with weakness and SOB. Pt also COVID-19 positive. 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   Discussed diet liberalization with MD who agreed. Regular diet ordered.  Reviewed weight history in chart. Pt with a 50.2 kg weight loss since 02/22/18. Difficult to determine whether this weight loss is accurate given variable weights over the last 1.5 years. See below.  Wt Readings from Last 5 Encounters:  01/09/19 81.3 kg  10/13/18 75.8 kg  02/22/18 131.5 kg  08/31/17 88.9 kg   If weight loss is accurate, this is a 38% weight loss in less than 11 months which is significant for timeframe. Some of weight loss can be attributed to BKA in September 2020.  Per RN edema assessment, pt with deep pitting edema to BLE which may be masking additional weight loss.  Unable to reach pt via phone call to room. RD will order oral nutrition supplements to aid pt in meeting kcal and protein needs during admission. Will also order daily MVI with minerals.  Meal Completion: 25% x 2 meals  Medications reviewed and include:  Decadron, SSI, IV abx, remdesivir, Actemra  Labs reviewed. CBG's: 121-231 x 24 hours  NUTRITION - FOCUSED PHYSICAL EXAM:  Unable to complete at this time. RD working remotely.  Diet Order:   Diet Order            Diet regular Room service appropriate? Yes; Fluid consistency: Thin  Diet effective now              EDUCATION NEEDS:   No education needs have been identified at this time  Skin:  Skin Assessment: Skin Integrity Issues: Other: s/p left BKA  Last BM:  no documented BM  Height:   Ht Readings from Last 1 Encounters:  01/09/19 6\' 3"  (1.905 m)    Weight:    Wt Readings from Last 1 Encounters:  01/09/19 81.3 kg    Ideal Body Weight:  83.3 kg (adjusted for left BKA)  BMI:  Body mass index is 22.4 kg/m.  Estimated Nutritional Needs:   Kcal:  2400-2600  Protein:  115-130 grams  Fluid:  >/= 2.0 L    Gaynell Face, MS, RD, LDN Inpatient Clinical Dietitian Pager: [phone] Weekend/After Hours: [phone]

## 2019-01-09 NOTE — Progress Notes (Signed)
Notified MD of patients rectal temp low. Will order bare hugger for patient.

## 2019-01-09 NOTE — Progress Notes (Signed)
PROGRESS NOTE    Daniel Moon    Code Status: Full Code  [id number] DOB: 1960/01/21 DOA: 12/24/2018  PCP: Sanjuana Letters, MD    Hospital Summary  This is a 58 year old male with past medical history of type 2 diabetes, polysubstance abuse, systolic heart failure with recent admission September 2020 for left BKA infection with MRSA bacteremia at that time and TEE showing LV thrombus and EF 30 to 35%, came to the ED 2 days ago with complaint of shortness of breath and left leg pain.  Covid was positive but patient wanted to go back home.  Patient return to the ED 12/22 due to increased shortness of breath.  In the ED he was lethargic.  CT head unremarkable, ABG with hypoxemia.  Chest x-ray with moderate right side pleural effusion with some features suggesting loculation.  CT chest with multifocal airspace disease bilaterally, moderate pleural effusions bilaterally free-flowing with compressive atelectasis, several enlarged lymph nodes of uncertain etiology likely inflammatory, enlargement of main pulmonary outflow tract indicated above pulmonary arterial hypertension, hepatic steatosis and coronary artery calcification. He was started on remdesivir and Decadron for COVID-19 and VQ scan was ordered due to elevated D-dimer which showed low risk for PE.  CT head unremarkable for acute encephalopathy.  Due to multiple comorbidities and increasing O2 requirements, patient was transferred to [facility] 12/23.  A & P   Principal Problem:   Acute respiratory failure with hypoxia (HCC) Active Problems:   HTN (hypertension)   COPD (chronic obstructive pulmonary disease) (HCC)   PAD (peripheral artery disease) (HCC)   Chronic renal insufficiency, stage 3 (moderate)   Paroxysmal atrial fibrillation (HCC)   COVID-19 virus infection  Acute respiratory failure with hypoxia likely multifactorial: Covid infection, multifocal pneumonia, possible heart failure Hypothermic today, requiring 2->4 L nasal  cannula BNP 2480 CT chest with multifocal airspace disease bilaterally, moderate pleural effusions bilaterally free-flowing with compressive atelectasis, several enlarged lymph nodes of uncertain etiology likely inflammatory, enlargement of main pulmonary outflow tract indicated above pulmonary arterial hypertension VQ negative -on IV Decadron and remdesivir -Change Azithromycin to Doxycycline due to prolonged QTc -Continue Ceftriaxone -Lasix 40 mg IV x 1 -Transfer to [facility]  -Due to increasing oxygen requirements, CRP 7.6 on 12/21, will give dose of Actemra -Follow up on inflammatory markers from today  Acute encephalopathy/lethargy likely secondary to hypoxia as well as heroin/methadone induced.   CT head unremarkable patient answers questions appropriately but somnolent today   Patient admits to taking heroin recently.   - Decrease Methadone, monitor for withdrawal - Closely monitor.  Recently diagnosed LV thrombus on Xarelto - Continue xarelto  Elevated D Dimer D Dimer 8 Negative VQ -Bilateral lower extremity US  Prolonged QT  524 ms in setting of methadone and azithromycin -Change azithromycin to doxycycline -Decrease methadone from 15 mg to 10 mg -Repeat EKG in a.m.  Chronic kidney disease with mild hyperkalemia  No labs from today -Stat labs -Hold losartan pending further lab work  Acute on chronic chronic systolic heart failure  last EF measured recently was 20 to 25% on ARB Coreg.  BNP 2480, lower extremity edema -Continue Coreg -Hold losartan pending further lab work -I/O and daily weight -Lasix  -Heart failure protocol  Hypertension on Coreg, ARB and amlodipine at home -hold ARB  Polysubstance abuse admits to taking heroin recently on methadone as confirmed with pharmacy. -decrease methadone -monitor for withdrawal  Patient admitted for left BKA stump infection with MRSA.   Blood cultures with no growth  to date Patient states he still takes IV drugs on  admission though he should have completed -Continue to monitor  Diabetes mellitus type 2  -sliding scale coverage -HA1c  Hypoalbuminemia -monitor and consider repletion -RD consult   DVT prophylaxis: xarelto Diet: heart healthy, carb modified Family Communication: no family at bedside Disposition Plan:pending clinical stability, plan to transfer to [facility] when bed available  Consultants  none  Procedures  none  Antibiotics   Anti-infectives (From admission, onward)   Start     Dose/Rate Route Frequency Ordered Stop   01/09/19 1500  doxycycline (VIBRA-TABS) tablet 100 mg     100 mg Oral Every 12 hours 01/09/19 1457     01/09/19 1000  remdesivir 100 mg in sodium chloride 0.9 % 100 mL IVPB     100 mg 200 mL/hr over 30 Minutes Intravenous Daily 01/08/19 0034 01/13/19 0959   01/08/19 1300  azithromycin (ZITHROMAX) tablet 500 mg  Status:  Discontinued     500 mg Oral Daily 01/08/19 1029 01/09/19 1457   01/08/19 1200  cefTRIAXone (ROCEPHIN) 1 g in sodium chloride 0.9 % 100 mL IVPB     1 g 200 mL/hr over 30 Minutes Intravenous Every 24 hours 01/08/19 1029     01/08/19 0115  remdesivir 200 mg in sodium chloride 0.9% 250 mL IVPB     200 mg 580 mL/hr over 30 Minutes Intravenous Once 01/08/19 0034 01/08/19 0200           Subjective   Patient seen and examined at bedside this morning.  Appears uncomfortable but in no respiratory distress.  He was somnolent and easily poor historian but was able to arouse to verbal stimuli.  Did have any specific complaints but did report that he did not feel like himself.  Full ROS unable to be obtained due to patient's poor mental status  Objective   Vitals:   01/09/19 1040 01/09/19 1200 01/09/19 1225 01/09/19 1400  BP:  104/73    Pulse:  (!) 57 60   Resp:  11    Temp:  (!) 94.8 F (34.9 C)  (!) 94.5 F (34.7 C)  TempSrc:  Rectal  Rectal  SpO2:  97%    Weight: 81.3 kg     Height:        Intake/Output Summary (Last 24 hours) at  01/09/2019 1542 Last data filed at 01/09/2019 1500 Gross per 24 hour  Intake 615.22 ml  Output 400 ml  Net 215.22 ml   Filed Weights   01/09/19 1000 01/09/19 1040  Weight: 75.8 kg 81.3 kg    Examination:  Physical Exam Vitals and nursing note reviewed.  Constitutional:      Appearance: He is ill-appearing. He is not diaphoretic.     Comments: Somnolent  HENT:     Head: Normocephalic and atraumatic.     Nose: Nose normal.     Mouth/Throat:     Mouth: Mucous membranes are moist.  Cardiovascular:     Rate and Rhythm: Normal rate and regular rhythm.  Pulmonary:     Comments: Difficult to auscultate Abdominal:     General: Abdomen is flat.     Palpations: Abdomen is soft.  Musculoskeletal:     Right lower leg: Edema present.     Comments: Status post left BKA  Skin:    General: Skin is warm.  Neurological:     Mental Status: He is disoriented.     Data Reviewed: I have personally reviewed following labs and  imaging studies  CBC: Recent Labs  Lab 12/19/2018 1943 01/15/2019 2139 01/08/19 0813  WBC 4.8  --  3.7*  NEUTROABS 2.8  --  2.9  HGB 14.8 15.6 12.9*  HCT 48.1 46.0 41.7  MCV 67.4*  --  66.9*  PLT 267  --  0000000   Basic Metabolic Panel: Recent Labs  Lab 12/31/2018 1943 12/30/2018 2139 01/08/19 0813  NA 138 141 137  K 5.2* 4.3 4.8  CL 103  --  102  CO2 23  --  24  GLUCOSE 78  --  126*  BUN 19  --  19  CREATININE 1.54*  --  1.52*  CALCIUM 8.1*  --  8.0*   GFR: Estimated Creatinine Clearance: 61.7 mL/min (A) (by C-G formula based on SCr of 1.52 mg/dL (H)). Liver Function Tests: Recent Labs  Lab 01/05/2019 1943 01/08/19 0813  AST 69* 48*  ALT 37 27  ALKPHOS 154* 129*  BILITOT 1.9* 1.4*  PROT 8.3* 6.8  ALBUMIN 2.1* 1.7*   No results for input(s): LIPASE, AMYLASE in the last 168 hours. No results for input(s): AMMONIA in the last 168 hours. Coagulation Profile: No results for input(s): INR, PROTIME in the last 168 hours. Cardiac Enzymes: No results  for input(s): CKTOTAL, CKMB, CKMBINDEX, TROPONINI in the last 168 hours. BNP (last 3 results) No results for input(s): PROBNP in the last 8760 hours. HbA1C: No results for input(s): HGBA1C in the last 72 hours. CBG: Recent Labs  Lab 01/08/19 2019 01/08/19 2306 01/09/19 0342 01/09/19 0747 01/09/19 1218  GLUCAP 165* 226* 231* 192* 166*   Lipid Profile: Recent Labs    01/16/2019 1943  TRIG 111   Thyroid Function Tests: No results for input(s): TSH, T4TOTAL, FREET4, T3FREE, THYROIDAB in the last 72 hours. Anemia Panel: Recent Labs    01/14/2019 1943 01/08/19 0813  FERRITIN 98 77   Sepsis Labs: Recent Labs  Lab 01/01/2019 1943 12/31/2018 2147 01/08/19 0813 01/08/19 1427  PROCALCITON 0.20  --   --   --   LATICACIDVEN 3.0* 2.3* 2.8* 2.2*    Recent Results (from the past 240 hour(s))  Blood Culture (routine x 2)     Status: None (Preliminary result)   Collection Time: 12/27/2018  7:20 PM   Specimen: BLOOD  Result Value Ref Range Status   Specimen Description BLOOD RIGHT ANTECUBITAL  Final   Special Requests   Final    BOTTLES DRAWN AEROBIC ONLY Blood Culture results may not be optimal due to an inadequate volume of blood received in culture bottles   Culture   Final    NO GROWTH 2 DAYS Performed at [facility] [address] [place], [place] [address]    Report Status PENDING  Incomplete  Blood Culture (routine x 2)     Status: None (Preliminary result)   Collection Time: 12/22/2018  7:25 PM   Specimen: BLOOD  Result Value Ref Range Status   Specimen Description BLOOD RIGHT UPPER ARM  Final   Special Requests   Final    BOTTLES DRAWN AEROBIC AND ANAEROBIC Blood Culture results may not be optimal due to an inadequate volume of blood received in culture bottles   Culture   Final    NO GROWTH 2 DAYS Performed at [facility], [place] [address], [place], [place] [address]    Report Status PENDING  Incomplete         Radiology Studies: CT Head Wo  Contrast  Result Date: 01/17/2019 CLINICAL  DATA:  58 year old male with encephalopathy. EXAM: CT HEAD WITHOUT CONTRAST TECHNIQUE: Contiguous axial images were obtained from the base of the skull through the vertex without intravenous contrast. COMPARISON:  None. FINDINGS: Brain: The ventricles and sulci appropriate size for patient's age. Minimal periventricular and deep white matter chronic microvascular ischemic changes noted. There is a focal area of old infarct and encephalomalacia in the left cerebellar hemisphere. There is no acute intracranial hemorrhage. No mass effect or midline shift. No extra-axial fluid collection. Vascular: No hyperdense vessel or unexpected calcification. Skull: Normal. Negative for fracture or focal lesion. Sinuses/Orbits: Partial opacification of the left maxillary sinus. The remainder of the visualized paranasal sinuses and mastoid air cells are clear. Other: None IMPRESSION: 1. No acute intracranial hemorrhage. 2. Mild age-related atrophy and chronic microvascular ischemic changes. Old left cerebellar infarct. Electronically Signed   By: Anner Crete M.D.   On: 01/16/2019 23:07   CT CHEST WO CONTRAST  Result Date: 01/08/2019 CLINICAL DATA:  Shortness of breath.  COVID-19 positive EXAM: CT CHEST WITHOUT CONTRAST TECHNIQUE: Multidetector CT imaging of the chest was performed following the standard protocol without IV contrast. COMPARISON:  Chest radiograph January 07, 2019 FINDINGS: Cardiovascular: There is no thoracic aortic aneurysm on this noncontrast enhanced study. There are scattered foci of calcification in visualized great vessels. There are foci of aortic atherosclerosis. There are occasional foci of coronary artery calcification. There is no pericardial effusion or pericardial thickening evident. The main pulmonary outflow tract is prominent, measuring 3.3 cm in diameter. Mediastinum/Nodes: Thyroid appears unremarkable. There are scattered prominent mediastinal  lymph nodes. There is a lymph node anterior to the carina. On the right measuring 1.5 x 1.4 cm. There is a lymph node adjacent to the aortic arch on the left measuring 1.5 x 1.3 cm. A second lymph node adjacent to the aortic arch measures 1.5 x 1.3 cm. There is a lymph node in the aortopulmonary window region measuring 1.6 x 1.3 cm. There is a subcarinal lymph node measuring 1.5 x 1.5 cm. No esophageal lesions are evident. Lungs/Pleura: There are moderate free-flowing pleural effusions bilaterally. There is mild loculation in the right pleural effusion. There is compressive atelectasis in each lower lobe. There is widespread airspace opacity throughout the lungs bilaterally, with consolidation in a portion of the posterior segment left upper lobe. Other areas of opacity have a more ground-glass type appearance. Upper Abdomen: There is a degree of hepatic steatosis. Visualized upper abdominal structures otherwise appear unremarkable. Musculoskeletal: There is degenerative change in the thoracic spine. There are no blastic or lytic bone lesions. No chest wall lesions are evident. IMPRESSION: 1. Multifocal airspace disease bilaterally, primarily with ground-glass type opacity, although there is consolidation in a portion of the posterior segment of the left upper lobe. Suspect atypical organism pneumonia at least partially responsible for the findings. 2. Moderate pleural effusions bilaterally, slightly larger on the right than on the left with mild loculation on the right. Most of the effusions bilaterally are free-flowing. There is compressive atelectasis in each posterior lung base. 3. Several enlarged lymph nodes of uncertain etiology. Adenopathy may be of reactive/inflammatory etiology secondary to the extensive abnormality in the lung parenchyma. 4. Enlargement of the main pulmonary outflow tract, a finding indicative of underlying pulmonary arterial hypertension. 5.  Hepatic steatosis. 6. Foci of aortic  atherosclerosis as well as great vessel and coronary artery calcification. Aortic Atherosclerosis (ICD10-I70.0). Electronically Signed   By: Lowella Grip III M.D.   On: 01/08/2019 08:48  NM Pulmonary Perf and Vent  Result Date: 01/08/2019 CLINICAL DATA:  COVID-19 positive patient with increased work of breathing. EXAM: NUCLEAR MEDICINE PERFUSION LUNG SCAN TECHNIQUE: Perfusion images were obtained in multiple projections after intravenous injection of Tc-66m MAA. RADIOPHARMACEUTICALS:  1.6 mCi Tc24m MAA IV COMPARISON:  CT chest earlier today. Single-view of the chest 01/04/2019. FINDINGS: Perfusion: Large perfusion defect in the right lung base correlates with the moderate to moderately large right pleural effusion and extensive airspace disease seen on the comparison examinations. Small area of decreased perfusion in the left lung base correlates with a smaller left pleural effusion and scattered airspace disease. IMPRESSION: Low probability for pulmonary embolus in patient with extensive multifocal pneumonia and pleural effusions. Electronically Signed   By: Inge Rise M.D.   On: 01/08/2019 18:07   DG Chest Port 1 View  Result Date: 01/09/2019 CLINICAL DATA:  Shortness of breath, COVID-19 positive, recent BKA EXAM: PORTABLE CHEST 1 VIEW COMPARISON:  Radiograph 01/04/2019 FINDINGS: Redemonstration of the bilateral interstitial and airspace opacities throughout both lungs similar to slightly increased accounting for differences in technique. There is a moderate right pleural effusion with some features suggesting loculation. Suspect some left effusion as well. No pneumothorax. Cardiomediastinal contours are partially obscured by overlying opacity but grossly similar accounting for differences in technique and patient rotation. No acute osseous or soft tissue abnormality. Degenerative changes are present in the imaged spine and shoulders. IMPRESSION: 1. Redemonstration of the bilateral  interstitial and airspace opacities, similar to slightly increased in the interval. Compatible with multifocal pneumonia. 2. Moderate right pleural effusion with some features suggesting loculation. Suspect trace left effusion as well. Electronically Signed   By: Lovena Le M.D.   On: 01/17/2019 19:33        Scheduled Meds: . amLODipine  10 mg Oral Daily  . atorvastatin  40 mg Oral QHS  . carvedilol  12.5 mg Oral BID  . dexamethasone (DECADRON) injection  6 mg Intravenous Q24H  . doxycycline  100 mg Oral Q12H  . insulin aspart  0-9 Units Subcutaneous Q4H  . [START ON 01/10/2019] methadone  10 mg Oral Daily  . rivaroxaban  20 mg Oral Q supper   Continuous Infusions: . cefTRIAXone (ROCEPHIN)  IV 1 g (01/09/19 1222)  . remdesivir 100 mg in NS 100 mL Stopped (01/09/19 1223)  . tocilizumab (ACTEMRA) IV       LOS: 2 days    Time spent: 30 minutes with over 50% of the time coordinating the patient's care    Harold Hedge, DO [facility] Pager [phone]  If 7PM-7AM, please contact night-coverage [email or web address] Password [facility] 01/09/2019, 3:42 PM

## 2019-01-09 NOTE — Plan of Care (Signed)

## 2019-01-09 NOTE — Significant Event (Signed)
Rapid Response Event Note  Overview: Called d/t decreased LOC. VS and CBC stable per RN. BMP/CBC/ABG orders placed.  Time Called: 2100 Arrival Time: 2115 Event Type: Neurologic  Initial Focused Assessment: Pt laying in bed with eyes closed. Will open eyes to pain but closes them very quickly. Pt will move all extremities to painful stimuli but will not follow commands. Skin cool to touch. Lungs rhonchi t/o. Pt moans when abd pressed on.  T-96, HR-60, BP-90/69, RR-14, SpO2-100% on 2L [place].   Pt was anxious earlier and received 1mg  ativan at 1719.   Interventions: CBC BMP ABG-7.40/41.1/70.1/25.6 ABD xray>No bowel dilatation or evidence of obstruction. PCXR >No significant interval change in the appearance of the lungs compared to prior radiograph. Bilateral airspace opacities and right pleural effusion.  Plan of Care (if not transferred): ???whether this is from the ativan administered at 1719??? VS and ABG WNL. Await lab results and notify NP of abnormalities. Continue to monitor pt. Call RRT if further assistance needed. Event Summary: Name of Physician Notified: Kennon Holter, NP at 2100    at          [place], Carren Rang

## 2019-01-09 NOTE — Progress Notes (Signed)
Phlebotomy called and notified nurse that phlebotomy unable to get lab draw after 3 times. Nurse notified Lovey Newcomer NP, On call. XKennon Holter states to conitinue to try to get blood draw.

## 2019-01-10 ENCOUNTER — Inpatient Hospital Stay (HOSPITAL_COMMUNITY): Payer: Medicaid Other

## 2019-01-10 DIAGNOSIS — R2243 Localized swelling, mass and lump, lower limb, bilateral: Secondary | ICD-10-CM

## 2019-01-10 DIAGNOSIS — U071 COVID-19: Secondary | ICD-10-CM

## 2019-01-10 LAB — CBC
HCT: 38.4 % — ABNORMAL LOW (ref 39.0–52.0)
HCT: 39.5 % (ref 39.0–52.0)
Hemoglobin: 12.2 g/dL — ABNORMAL LOW (ref 13.0–17.0)
Hemoglobin: 12.7 g/dL — ABNORMAL LOW (ref 13.0–17.0)
MCH: 20.9 pg — ABNORMAL LOW (ref 26.0–34.0)
MCH: 20.9 pg — ABNORMAL LOW (ref 26.0–34.0)
MCHC: 31.8 g/dL (ref 30.0–36.0)
MCHC: 32.2 g/dL (ref 30.0–36.0)
MCV: 65.9 fL — ABNORMAL LOW (ref 80.0–100.0)
MCV: 64.9 fL — ABNORMAL LOW (ref 80.0–100.0)
Platelets: 223 10*3/uL (ref 150–400)
Platelets: 230 10*3/uL (ref 150–400)
RBC: 5.83 MIL/uL — ABNORMAL HIGH (ref 4.22–5.81)
RBC: 6.09 MIL/uL — ABNORMAL HIGH (ref 4.22–5.81)
RDW: 21.1 % — ABNORMAL HIGH (ref 11.5–15.5)
RDW: 21.4 % — ABNORMAL HIGH (ref 11.5–15.5)
WBC: 15.3 10*3/uL — ABNORMAL HIGH (ref 4.0–10.5)
WBC: 16.5 10*3/uL — ABNORMAL HIGH (ref 4.0–10.5)
nRBC: 3 % — ABNORMAL HIGH (ref 0.0–0.2)
nRBC: 2.4 % — ABNORMAL HIGH (ref 0.0–0.2)

## 2019-01-10 LAB — COMPREHENSIVE METABOLIC PANEL
ALT: 22 U/L (ref 0–44)
ALT: 23 U/L (ref 0–44)
AST: 28 U/L (ref 15–41)
AST: 32 U/L (ref 15–41)
Albumin: 1.6 g/dL — ABNORMAL LOW (ref 3.5–5.0)
Albumin: 1.6 g/dL — ABNORMAL LOW (ref 3.5–5.0)
Alkaline Phosphatase: 105 U/L (ref 38–126)
Alkaline Phosphatase: 109 U/L (ref 38–126)
Anion gap: 11 (ref 5–15)
Anion gap: 13 (ref 5–15)
BUN: 38 mg/dL — ABNORMAL HIGH (ref 6–20)
BUN: 41 mg/dL — ABNORMAL HIGH (ref 6–20)
CO2: 24 mmol/L (ref 22–32)
CO2: 22 mmol/L (ref 22–32)
Calcium: 7.6 mg/dL — ABNORMAL LOW (ref 8.9–10.3)
Calcium: 7.7 mg/dL — ABNORMAL LOW (ref 8.9–10.3)
Chloride: 104 mmol/L (ref 98–111)
Chloride: 104 mmol/L (ref 98–111)
Creatinine, Ser: 1.99 mg/dL — ABNORMAL HIGH (ref 0.61–1.24)
Creatinine, Ser: 2.01 mg/dL — ABNORMAL HIGH (ref 0.61–1.24)
GFR calc Af Amer: 42 mL/min — ABNORMAL LOW (ref >60)
GFR calc Af Amer: 41 mL/min — ABNORMAL LOW (ref >60)
GFR calc non Af Amer: 36 mL/min — ABNORMAL LOW (ref >60)
GFR calc non Af Amer: 36 mL/min — ABNORMAL LOW (ref >60)
Glucose, Bld: 125 mg/dL — ABNORMAL HIGH (ref 70–99)
Glucose, Bld: 142 mg/dL — ABNORMAL HIGH (ref 70–99)
Potassium: 4.7 mmol/L (ref 3.5–5.1)
Potassium: 4.8 mmol/L (ref 3.5–5.1)
Sodium: 139 mmol/L (ref 135–145)
Sodium: 139 mmol/L (ref 135–145)
Total Bilirubin: 0.8 mg/dL (ref 0.3–1.2)
Total Bilirubin: 0.9 mg/dL (ref 0.3–1.2)
Total Protein: 6.3 g/dL — ABNORMAL LOW (ref 6.5–8.1)
Total Protein: 6.4 g/dL — ABNORMAL LOW (ref 6.5–8.1)

## 2019-01-10 LAB — BLOOD GAS, ARTERIAL
Acid-base deficit: 1.7 mmol/L (ref 0.0–2.0)
Bicarbonate: 22 mmol/L (ref 20.0–28.0)
Drawn by: 39899
FIO2: 28
O2 Saturation: 98.3 %
Patient temperature: 37
pCO2 arterial: 34.1 mmHg (ref 32.0–48.0)
pH, Arterial: 7.425 (ref 7.350–7.450)
pO2, Arterial: 123 mmHg — ABNORMAL HIGH (ref 83.0–108.0)

## 2019-01-10 LAB — C-REACTIVE PROTEIN: CRP: 4.5 mg/dL — ABNORMAL HIGH (ref <1.0)

## 2019-01-10 LAB — AMMONIA: Ammonia: 27 umol/L (ref 9–35)

## 2019-01-10 LAB — LACTIC ACID, PLASMA: Lactic Acid, Venous: 2 mmol/L (ref 0.5–1.9)

## 2019-01-10 LAB — GLUCOSE, CAPILLARY
Glucose-Capillary: 134 mg/dL — ABNORMAL HIGH (ref 70–99)
Glucose-Capillary: 124 mg/dL — ABNORMAL HIGH (ref 70–99)
Glucose-Capillary: 189 mg/dL — ABNORMAL HIGH (ref 70–99)
Glucose-Capillary: 240 mg/dL — ABNORMAL HIGH (ref 70–99)
Glucose-Capillary: 109 mg/dL — ABNORMAL HIGH (ref 70–99)
Glucose-Capillary: 106 mg/dL — ABNORMAL HIGH (ref 70–99)

## 2019-01-10 LAB — D-DIMER, QUANTITATIVE: D-Dimer, Quant: 6.67 ug/mL-FEU — ABNORMAL HIGH (ref 0.00–0.50)

## 2019-01-10 MED ORDER — ZIPRASIDONE MESYLATE 20 MG IM SOLR
20.0000 mg | Freq: Once | INTRAMUSCULAR | Status: AC
  Administered 2019-01-10: 20 mg via INTRAMUSCULAR
  Filled 2019-01-10: qty 20

## 2019-01-10 MED ORDER — HYDROMORPHONE HCL 1 MG/ML IJ SOLN
0.5000 mg | INTRAMUSCULAR | Status: DC | PRN
  Administered 2019-01-10 – 2019-01-12 (×4): 0.5 mg via INTRAVENOUS
  Filled 2019-01-10 (×4): qty 0.5

## 2019-01-10 MED ORDER — STERILE WATER FOR INJECTION IJ SOLN
INTRAMUSCULAR | Status: AC
  Administered 2019-01-10: 1.2 mL
  Filled 2019-01-10: qty 10

## 2019-01-10 MED ORDER — FUROSEMIDE 10 MG/ML IJ SOLN
40.0000 mg | Freq: Once | INTRAMUSCULAR | Status: AC
  Administered 2019-01-10: 12:00:00 40 mg via INTRAVENOUS
  Filled 2019-01-10: qty 4

## 2019-01-10 MED ORDER — ALBUMIN HUMAN 25 % IV SOLN
12.5000 g | Freq: Once | INTRAVENOUS | Status: AC
  Administered 2019-01-10: 12.5 g via INTRAVENOUS
  Filled 2019-01-10: qty 50

## 2019-01-10 MED ORDER — METHADONE HCL 5 MG PO TABS: 7.5000 mg | ORAL_TABLET | Freq: Every day | ORAL | Status: DC

## 2019-01-10 MED ORDER — LORAZEPAM 2 MG/ML IJ SOLN
0.5000 mg | Freq: Once | INTRAMUSCULAR | Status: AC
  Administered 2019-01-10: 0.5 mg via INTRAMUSCULAR
  Filled 2019-01-10: qty 1

## 2019-01-10 MED ORDER — HALOPERIDOL LACTATE 5 MG/ML IJ SOLN
2.0000 mg | Freq: Once | INTRAMUSCULAR | Status: AC
  Administered 2019-01-10: 16:00:00 2 mg via INTRAMUSCULAR
  Filled 2019-01-10: qty 1

## 2019-01-10 NOTE — Progress Notes (Signed)
VAST consulted for IV access. Pt confused, agitated and combative with IV attempt. Unit RN administered Haldol, IM. Once patient somewhat sedated, VAST RN able to place 1.88 inch 20g IV in right anterior forearm with use of ultrasound.  IV secured in place and mittens applied to pt's hands to prevent pt from pulling out IV access.

## 2019-01-10 NOTE — Progress Notes (Addendum)
PROGRESS NOTE    Daniel Moon    Code Status: Full Code  [id number] DOB: 1960/07/25 DOA: 01/02/2019  PCP: Sanjuana Letters, MD    Hospital Summary  This is a 58 year old male with past medical history of type 2 diabetes, polysubstance abuse, systolic heart failure with recent admission September 2020 for left BKA infection with MRSA bacteremia at that time and TEE showing LV thrombus and EF 30 to 35%, came to the ED 2 days ago with complaint of shortness of breath and left leg pain.  Covid was positive but patient wanted to go back home.  Patient return to the ED 12/22 due to increased shortness of breath.  In the ED he was lethargic.  CT head unremarkable, ABG with hypoxemia.  Chest x-ray with moderate right side pleural effusion with some features suggesting loculation.  CT chest with multifocal airspace disease bilaterally, moderate pleural effusions bilaterally free-flowing with compressive atelectasis, several enlarged lymph nodes of uncertain etiology likely inflammatory, enlargement of main pulmonary outflow tract indicated above pulmonary arterial hypertension, hepatic steatosis and coronary artery calcification. He was started on remdesivir and Decadron for COVID-19 and VQ scan was ordered due to elevated D-dimer which showed low risk for PE.  CT head unremarkable for acute encephalopathy.  Due to multiple comorbidities and increasing O2 requirements, patient was transferred to [facility] 12/23.  A & P   Principal Problem:   Acute respiratory failure with hypoxia (HCC) Active Problems:   HTN (hypertension)   COPD (chronic obstructive pulmonary disease) (HCC)   PAD (peripheral artery disease) (HCC)   Chronic renal insufficiency, stage 3 (moderate)   Paroxysmal atrial fibrillation (HCC)   COVID-19 virus infection  Acute respiratory failure with hypoxia likely multifactorial: Covid infection, multifocal pneumonia, possible heart failure Still with hypothermia, requiring 2->4-> L  nasal cannula WBC 16.5, possibly steroid induced.  Blood cultures no growth to date BNP 2480 CT chest with multifocal airspace disease bilaterally, moderate pleural effusions bilaterally free-flowing with compressive atelectasis, several enlarged lymph nodes of uncertain etiology likely inflammatory, enlargement of main pulmonary outflow tract indicated above pulmonary arterial hypertension VQ negative Poor output with Lasix, bladder scans with minimal urine, possibly from poor oncotic pressure with low albumin Lactic acid still high 2.0 -on IV Decadron and remdesivir, received Actemra x1 on 12/23.  Will hold Decadron as below -Changed Azithromycin to Doxycycline due to prolonged QTc on 12/23 -Continue Ceftriaxone -Repeat Lasix -Albumin -Transfer to [facility]   -UA -Follow up on inflammatory markers from today  Acute encephalopathy/lethargy likely multifactorial:  Hypoxia, medication induced in setting of AKI and heroin/methadone induced   U tox and ethanol level not obtained on admission but patient admitted to taking heroin Has required Ativan 12/23 -24 due to increased agitation/hallucinations/pulling at lines/combative.  Tried to give IV opiate in case withdrawing however he lost IV access and required IM Ativan On decreased dose of methadone due to prolonged QT CT head unremarkable Ammonia 27 -Closely monitor -Delirium precautions  -Possibly steroid-induced?  Will hold steroids -If no improvement consider neurology eval  Recently diagnosed LV thrombus on Xarelto - Continue xarelto -Limited echo  Elevated D Dimer D Dimer 8->6 Negative VQ -Bilateral lower extremity US pending  Prolonged QT  524 ms in setting of methadone and azithromycin Change azithromycin to doxycycline, repeat 418 ms on 12/24 -Continue decreased methadone 10 mg -Avoid QT prolonging agents  AKI on CKD 3 Appears volume overloaded however poor oncotic pressure given low albumin hyperkalemia resolved Baseline  creatinine 1.2, currently  2.0 Bladder scan for roughly 150 cc this a.m. -Hold losartan -Albumin and Lasix  -UA  Acute on chronic chronic systolic heart failure  last EF measured recently was 20 to 25% on ARB Coreg.  BNP 2480, lower extremity edema -Continue Coreg -Hold losartan pending further lab work -I/O and daily weight -Lasix  -Heart failure protocol -Limited echo  Hypertension on Coreg, ARB and amlodipine at home -hold ARB  Polysubstance abuse admits to taking heroin recently  on methadone as confirmed with pharmacy. -decreased methadone  Left BKA stump infection with MRSA (9/22 discharge)  Amputation 9/15 Completed Zyvox Blood cultures with no growth to date -Continue to monitor  Diabetes mellitus type 2  -sliding scale coverage -HA1c  Hypoalbuminemia -Albumin x1 -RD consult   DVT prophylaxis: xarelto Diet: N.p.o. Family Communication: called wife with no answer Disposition Plan: Transfer to [facility]  Consultants  none  Procedures  none  Antibiotics   Anti-infectives (From admission, onward)   Start     Dose/Rate Route Frequency Ordered Stop   01/09/19 1500  doxycycline (VIBRA-TABS) tablet 100 mg     100 mg Oral Every 12 hours 01/09/19 1457     01/09/19 1000  remdesivir 100 mg in sodium chloride 0.9 % 100 mL IVPB     100 mg 200 mL/hr over 30 Minutes Intravenous Daily 01/08/19 0034 01/13/19 0959   01/08/19 1300  azithromycin (ZITHROMAX) tablet 500 mg  Status:  Discontinued     500 mg Oral Daily 01/08/19 1029 01/09/19 1457   01/08/19 1200  cefTRIAXone (ROCEPHIN) 1 g in sodium chloride 0.9 % 100 mL IVPB     1 g 200 mL/hr over 30 Minutes Intravenous Every 24 hours 01/08/19 1029     01/08/19 0115  remdesivir 200 mg in sodium chloride 0.9% 250 mL IVPB     200 mg 580 mL/hr over 30 Minutes Intravenous Once 01/08/19 0034 01/08/19 0200           Subjective   Patient examined at bedside this morning.  Initially sleeping and difficult to arouse.   Upon further verbal stimuli patient woke up and became very agitated and trying to get out of bed, pulling at lines.  Nurse came to bedside was able to reorient the patient and calm him down.  He was able to fall back asleep.  Later this afternoon I received a page at the patient was becoming increasingly combative again and not able to be calmed down.  He was given Ativan 0.5 mg IM x1 with improvement.   Patient unable to give history at this time  Objective   Vitals:   01/10/19 0704 01/10/19 0800 01/10/19 1209 01/10/19 1300  BP:  129/90 123/88   Pulse:  69 72   Resp:  16 13   Temp:    (!) 96 F (35.6 C)  TempSrc:    Rectal  SpO2:  99% 98%   Weight: 81.1 kg     Height:        Intake/Output Summary (Last 24 hours) at 01/10/2019 1457 Last data filed at 01/10/2019 1000 Gross per 24 hour  Intake 450.22 ml  Output 500 ml  Net -49.78 ml   Filed Weights   01/09/19 1000 01/09/19 1040 01/10/19 0704  Weight: 75.8 kg 81.3 kg 81.1 kg    Examination:  Physical Exam Vitals and nursing note reviewed. Exam conducted with a chaperone present.  Constitutional:      General: He is in acute distress.     Comments:  Somnolent arousable to verbal and tactile stimuli and became agitated  HENT:     Head: Normocephalic and atraumatic.     Comments: Poor dentition    Mouth/Throat:     Mouth: Mucous membranes are moist.  Eyes:     Pupils: Pupils are equal, round, and reactive to light.  Cardiovascular:     Comments: Difficult auscultating with bedside disposable stethoscope Pulmonary:     Effort: No respiratory distress.     Breath sounds: No wheezing.  Abdominal:     General: Abdomen is flat.     Palpations: Abdomen is soft.  Musculoskeletal:     Comments: Status post left BKA with sleeve over stump Right lower extremity 2+ edema  Skin:    General: Skin is warm.     Coloration: Skin is not jaundiced.  Neurological:     Mental Status: He is disoriented.     Data Reviewed: I have  personally reviewed following labs and imaging studies  CBC: Recent Labs  Lab 01/15/2019 1943 01/01/2019 2139 01/08/19 0813 01/09/19 2303 01/10/19 0731  WBC 4.8  --  3.7* 15.3* 16.5*  NEUTROABS 2.8  --  2.9  --   --   HGB 14.8 15.6 12.9* 12.2* 12.7*  HCT 48.1 46.0 41.7 38.4* 39.5  MCV 67.4*  --  66.9* 65.9* 64.9*  PLT 267  --  233 223 [id number]   Basic Metabolic Panel: Recent Labs  Lab 01/16/2019 1943 01/01/2019 2139 01/08/19 0813 01/09/19 2303 01/10/19 0731  NA 138 141 137 139 139  K 5.2* 4.3 4.8 4.7 4.8  CL 103  --  102 104 104  CO2 23  --  24 24 22   GLUCOSE 78  --  126* 125* 142*  BUN 19  --  19 38* 41*  CREATININE 1.54*  --  1.52* 1.99* 2.01*  CALCIUM 8.1*  --  8.0* 7.6* 7.7*   GFR: Estimated Creatinine Clearance: 46.5 mL/min (A) (by C-G formula based on SCr of 2.01 mg/dL (H)). Liver Function Tests: Recent Labs  Lab 01/14/2019 1943 01/08/19 0813 01/09/19 2303 01/10/19 0731  AST 69* 48* 28 32  ALT 37 27 22 23   ALKPHOS 154* 129* 105 109  BILITOT 1.9* 1.4* 0.8 0.9  PROT 8.3* 6.8 6.3* 6.4*  ALBUMIN 2.1* 1.7* 1.6* 1.6*   No results for input(s): LIPASE, AMYLASE in the last 168 hours. Recent Labs  Lab 01/10/19 1200  AMMONIA 27   Coagulation Profile: No results for input(s): INR, PROTIME in the last 168 hours. Cardiac Enzymes: No results for input(s): CKTOTAL, CKMB, CKMBINDEX, TROPONINI in the last 168 hours. BNP (last 3 results) No results for input(s): PROBNP in the last 8760 hours. HbA1C: No results for input(s): HGBA1C in the last 72 hours. CBG: Recent Labs  Lab 01/09/19 2008 01/09/19 2347 01/10/19 0334 01/10/19 0803 01/10/19 1207  GLUCAP 139* 139* 134* 124* 189*   Lipid Profile: Recent Labs    01/14/2019 1943  TRIG 111   Thyroid Function Tests: No results for input(s): TSH, T4TOTAL, FREET4, T3FREE, THYROIDAB in the last 72 hours. Anemia Panel: Recent Labs    12/21/2018 1943 01/08/19 0813  FERRITIN 98 77   Sepsis Labs: Recent Labs  Lab  12/25/2018 1943 12/22/2018 2147 01/08/19 0813 01/08/19 1427 01/10/19 1200  PROCALCITON 0.20  --   --   --   --   LATICACIDVEN 3.0* 2.3* 2.8* 2.2* 2.0*    Recent Results (from the past 240 hour(s))  Blood Culture (routine x 2)  Status: None (Preliminary result)   Collection Time: 12/31/2018  7:20 PM   Specimen: BLOOD  Result Value Ref Range Status   Specimen Description BLOOD RIGHT ANTECUBITAL  Final   Special Requests   Final    BOTTLES DRAWN AEROBIC ONLY Blood Culture results may not be optimal due to an inadequate volume of blood received in culture bottles   Culture   Final    NO GROWTH 3 DAYS Performed at [facility], [place] [address] [place], [place] [address]    Report Status PENDING  Incomplete  Blood Culture (routine x 2)     Status: None (Preliminary result)   Collection Time: 12/25/2018  7:25 PM   Specimen: BLOOD  Result Value Ref Range Status   Specimen Description BLOOD RIGHT UPPER ARM  Final   Special Requests   Final    BOTTLES DRAWN AEROBIC AND ANAEROBIC Blood Culture results may not be optimal due to an inadequate volume of blood received in culture bottles   Culture   Final    NO GROWTH 3 DAYS Performed at [facility], [place] [address], [place], [place] [address]    Report Status PENDING  Incomplete  MRSA PCR Screening     Status: None   Collection Time: 01/09/19  4:01 PM   Specimen: Nasopharyngeal  Result Value Ref Range Status   MRSA by PCR NEGATIVE NEGATIVE Final    Comment:        The GeneXpert MRSA Assay (FDA approved for NASAL specimens only), is one component of a comprehensive MRSA colonization surveillance program. It is not intended to diagnose MRSA infection nor to guide or monitor treatment for MRSA infections. Performed at [facility], [place] [address], [place], [place] [address]          Radiology Studies: NM Pulmonary Perf and Vent  Result Date: 01/08/2019 CLINICAL DATA:  COVID-19 positive patient with increased  work of breathing. EXAM: NUCLEAR MEDICINE PERFUSION LUNG SCAN TECHNIQUE: Perfusion images were obtained in multiple projections after intravenous injection of Tc-31m MAA. RADIOPHARMACEUTICALS:  1.6 mCi Tc23m MAA IV COMPARISON:  CT chest earlier today. Single-view of the chest 12/27/2018. FINDINGS: Perfusion: Large perfusion defect in the right lung base correlates with the moderate to moderately large right pleural effusion and extensive airspace disease seen on the comparison examinations. Small area of decreased perfusion in the left lung base correlates with a smaller left pleural effusion and scattered airspace disease. IMPRESSION: Low probability for pulmonary embolus in patient with extensive multifocal pneumonia and pleural effusions. Electronically Signed   By: Inge Rise M.D.   On: 01/08/2019 18:07   DG Chest Port 1 View  Result Date: 01/09/2019 CLINICAL DATA:  58 year old male with shortness of breath and abdominal pain. Positive COVID-19. EXAM: PORTABLE CHEST 1 VIEW COMPARISON:  Chest radiograph dated 01/01/2019. FINDINGS: Bilateral airspace opacities relatively similar to prior radiograph. A right pleural effusion is present. There is no pneumothorax. Stable cardiac silhouette. There is no bowel dilatation or evidence of obstruction. No free air or radiopaque calculi. The osseous structures and soft tissues are grossly unremarkable. IMPRESSION: 1. No significant interval change in the appearance of the lungs compared to prior radiograph. Bilateral airspace opacities and right pleural effusion. 2. No bowel dilatation or evidence of obstruction. Electronically Signed   By: Anner Crete M.D.   On: 01/09/2019 22:44   DG Abd Portable 1V  Result Date: 01/09/2019 CLINICAL DATA:  58 year old male with shortness of breath and abdominal  pain. Positive COVID-19. EXAM: PORTABLE CHEST 1 VIEW COMPARISON:  Chest radiograph dated 01/01/2019. FINDINGS: Bilateral airspace opacities relatively similar  to prior radiograph. A right pleural effusion is present. There is no pneumothorax. Stable cardiac silhouette. There is no bowel dilatation or evidence of obstruction. No free air or radiopaque calculi. The osseous structures and soft tissues are grossly unremarkable. IMPRESSION: 1. No significant interval change in the appearance of the lungs compared to prior radiograph. Bilateral airspace opacities and right pleural effusion. 2. No bowel dilatation or evidence of obstruction. Electronically Signed   By: Anner Crete M.D.   On: 01/09/2019 22:44   VAS [place] LOWER EXTREMITY VENOUS (DVT)  Result Date: 01/10/2019  Lower Venous Study Indications: Swelling.  Risk Factors: COVID 19 positive. Limitations: Poor ultrasound/tissue interface and patient positioning, patient immobility, left BKA. Comparison Study: No prior studies. Performing Technologist: Oliver Hum RVT  Examination Guidelines: A complete evaluation includes B-mode imaging, spectral Doppler, color Doppler, and power Doppler as needed of all accessible portions of each vessel. Bilateral testing is considered an integral part of a complete examination. Limited examinations for reoccurring indications may be performed as noted.  +---------+---------------+---------+-----------+----------+--------------+ RIGHT    CompressibilityPhasicitySpontaneityPropertiesThrombus Aging +---------+---------------+---------+-----------+----------+--------------+ CFV      Full           Yes      Yes                                 +---------+---------------+---------+-----------+----------+--------------+ SFJ      Full                                                        +---------+---------------+---------+-----------+----------+--------------+ FV Prox  Full                                                        +---------+---------------+---------+-----------+----------+--------------+ FV Mid   Full                                                         +---------+---------------+---------+-----------+----------+--------------+ FV DistalFull                                                        +---------+---------------+---------+-----------+----------+--------------+ PFV      Full                                                        +---------+---------------+---------+-----------+----------+--------------+ POP      Full           Yes      Yes                                 +---------+---------------+---------+-----------+----------+--------------+  PTV      Full                                                        +---------+---------------+---------+-----------+----------+--------------+ PERO     Full                                                        +---------+---------------+---------+-----------+----------+--------------+   +---------+---------------+---------+-----------+----------+--------------+ LEFT     CompressibilityPhasicitySpontaneityPropertiesThrombus Aging +---------+---------------+---------+-----------+----------+--------------+ CFV      Full           Yes      Yes                                 +---------+---------------+---------+-----------+----------+--------------+ SFJ      Full                                                        +---------+---------------+---------+-----------+----------+--------------+ FV Prox  Full                                                        +---------+---------------+---------+-----------+----------+--------------+ FV Mid   Partial                                                     +---------+---------------+---------+-----------+----------+--------------+ FV DistalFull                                                        +---------+---------------+---------+-----------+----------+--------------+ PFV      Full                                                         +---------+---------------+---------+-----------+----------+--------------+ POP                                                   Not visualized +---------+---------------+---------+-----------+----------+--------------+ PTV  Not visualized +---------+---------------+---------+-----------+----------+--------------+ PERO                                                  Not visualized +---------+---------------+---------+-----------+----------+--------------+     Summary: Right: There is no evidence of deep vein thrombosis in the lower extremity. However, portions of this examination were limited- see technologist comments above. No cystic structure found in the popliteal fossa. Left: There is no evidence of deep vein thrombosis in the lower extremity. However, portions of this examination were limited- see technologist comments above. Ultrasound characteristics of enlarged lymph nodes noted in the groin.  *See table(s) above for measurements and observations.    Preliminary         Scheduled Meds: . amLODipine  10 mg Oral Daily  . atorvastatin  40 mg Oral QHS  . carvedilol  12.5 mg Oral BID  . dexamethasone (DECADRON) injection  6 mg Intravenous Q24H  . doxycycline  100 mg Oral Q12H  . feeding supplement (ENSURE ENLIVE)  237 mL Oral BID BM  . feeding supplement (PRO-STAT SUGAR FREE 64)  30 mL Oral BID  . insulin aspart  0-9 Units Subcutaneous Q4H  . methadone  10 mg Oral Daily  . multivitamin with minerals  1 tablet Oral Daily  . rivaroxaban  20 mg Oral Q supper   Continuous Infusions: . cefTRIAXone (ROCEPHIN)  IV 1 g (01/10/19 1133)  . remdesivir 100 mg in NS 100 mL 100 mg (01/10/19 0834)     LOS: 3 days    Time spent: 35 minutes with over 50% of the time coordinating the patient's care    Harold Hedge, DO [facility] Pager [phone]  If 7PM-7AM, please contact night-coverage [email or web address] Password  [email or web address] 01/10/2019, 2:57 PM

## 2019-01-10 NOTE — Progress Notes (Signed)
Bilateral lower extremity venous duplex has been completed. Preliminary results can be found in CV Proc through chart review.   01/10/19 10:14 AM Carlos Levering RVT

## 2019-01-10 NOTE — Progress Notes (Signed)
Patient very agitated, trying to get out of bed, yelling out. Patient pulled IV out, MD made aware. Ativan IM ordered. Charge nurse sat with patient in room, but patient still very anxious. MD made aware again. Haldol ordered for patient, also for patient transfer to [facility] eventually.

## 2019-01-10 NOTE — Progress Notes (Addendum)
Patient disoriented / hallucinating / attempting to climb OOB / pulling off monitors / has pulled out all PIVs.  Due to patient's lack of ability to comprehend good judgement, it was necessary to notify the MD, who then ordered bilateral soft wrist restraints for the patient.  Siderails padded with pillows to prevent injury to his left BKA from patient's constantly hitting it against the siderails.  He also required 1:1 supervision to prevent injury from throwing his body back in the bed.  See assessment for non-violent restraints under flowsheets.  Patient will require extensive medical treatment for his withdrawal as this clearly interferes with treating his respiratory illness.

## 2019-01-10 NOTE — Progress Notes (Signed)
Patient able to tell me his name.

## 2019-01-10 NOTE — Progress Notes (Signed)
   01/10/19 1800  Vitals  BP 96/74  MAP (mmHg) 81  BP Location Left Arm  BP Method Automatic  Patient Position (if appropriate) Lying  Pulse Rate Source Monitor  ECG Heart Rate 64  Cardiac Rhythm NSR  Resp (!) 26  Level of Consciousness  Level of Consciousness Responds to Voice  Oxygen Therapy  SpO2 94 %  O2 Device Nasal Cannula  O2 Flow Rate (L/min) 2 L/min  Complaints & Interventions  Patients response to intervention Unchanged  MEWS Score  MEWS RR 2  MEWS Pulse 0  MEWS Systolic 1  MEWS LOC 1  MEWS Temp 1  MEWS Score 5  MEWS Score Color Red  MEWS Assessment  Is this an acute change? Yes  MEWS guidelines implemented *See Row Information* Red    MD aware of patient's status.  New orders and frequent vital signs recorded.

## 2019-01-10 NOTE — Plan of Care (Signed)

## 2019-01-10 NOTE — Plan of Care (Signed)

## 2019-01-11 ENCOUNTER — Inpatient Hospital Stay (HOSPITAL_COMMUNITY): Payer: Medicaid Other

## 2019-01-11 DIAGNOSIS — F1910 Other psychoactive substance abuse, uncomplicated: Secondary | ICD-10-CM | POA: Diagnosis present

## 2019-01-11 DIAGNOSIS — I361 Nonrheumatic tricuspid (valve) insufficiency: Secondary | ICD-10-CM

## 2019-01-11 DIAGNOSIS — I340 Nonrheumatic mitral (valve) insufficiency: Secondary | ICD-10-CM

## 2019-01-11 DIAGNOSIS — I480 Paroxysmal atrial fibrillation: Secondary | ICD-10-CM

## 2019-01-11 LAB — CBC
HCT: 38.1 % — ABNORMAL LOW (ref 39.0–52.0)
Hemoglobin: 12 g/dL — ABNORMAL LOW (ref 13.0–17.0)
MCH: 20.5 pg — ABNORMAL LOW (ref 26.0–34.0)
MCHC: 31.5 g/dL (ref 30.0–36.0)
MCV: 65.2 fL — ABNORMAL LOW (ref 80.0–100.0)
Platelets: 195 10*3/uL (ref 150–400)
RBC: 5.84 MIL/uL — ABNORMAL HIGH (ref 4.22–5.81)
RDW: 21.4 % — ABNORMAL HIGH (ref 11.5–15.5)
WBC: 17.4 10*3/uL — ABNORMAL HIGH (ref 4.0–10.5)
nRBC: 2.4 % — ABNORMAL HIGH (ref 0.0–0.2)

## 2019-01-11 LAB — COMPREHENSIVE METABOLIC PANEL
ALT: 25 U/L (ref 0–44)
AST: 39 U/L (ref 15–41)
Albumin: 2 g/dL — ABNORMAL LOW (ref 3.5–5.0)
Alkaline Phosphatase: 112 U/L (ref 38–126)
Anion gap: 15 (ref 5–15)
BUN: 62 mg/dL — ABNORMAL HIGH (ref 6–20)
CO2: 20 mmol/L — ABNORMAL LOW (ref 22–32)
Calcium: 7.5 mg/dL — ABNORMAL LOW (ref 8.9–10.3)
Chloride: 107 mmol/L (ref 98–111)
Creatinine, Ser: 2.18 mg/dL — ABNORMAL HIGH (ref 0.61–1.24)
GFR calc Af Amer: 38 mL/min — ABNORMAL LOW (ref >60)
GFR calc non Af Amer: 32 mL/min — ABNORMAL LOW (ref >60)
Glucose, Bld: 83 mg/dL (ref 70–99)
Potassium: 5.1 mmol/L (ref 3.5–5.1)
Sodium: 142 mmol/L (ref 135–145)
Total Bilirubin: 1.2 mg/dL (ref 0.3–1.2)
Total Protein: 6.7 g/dL (ref 6.5–8.1)

## 2019-01-11 LAB — D-DIMER, QUANTITATIVE: D-Dimer, Quant: 8.46 ug/mL-FEU — ABNORMAL HIGH (ref 0.00–0.50)

## 2019-01-11 LAB — PHOSPHORUS: Phosphorus: 5.6 mg/dL — ABNORMAL HIGH (ref 2.5–4.6)

## 2019-01-11 LAB — GLUCOSE, CAPILLARY
Glucose-Capillary: 102 mg/dL — ABNORMAL HIGH (ref 70–99)
Glucose-Capillary: 105 mg/dL — ABNORMAL HIGH (ref 70–99)
Glucose-Capillary: 80 mg/dL (ref 70–99)
Glucose-Capillary: 81 mg/dL (ref 70–99)
Glucose-Capillary: 81 mg/dL (ref 70–99)
Glucose-Capillary: 93 mg/dL (ref 70–99)

## 2019-01-11 LAB — C-REACTIVE PROTEIN: CRP: 3.3 mg/dL — ABNORMAL HIGH (ref <1.0)

## 2019-01-11 LAB — ECHOCARDIOGRAM LIMITED
Height: 75 in
Weight: 2895.96 oz

## 2019-01-11 LAB — MAGNESIUM: Magnesium: 1.9 mg/dL (ref 1.7–2.4)

## 2019-01-11 MED ORDER — SODIUM CHLORIDE 0.9 % IV BOLUS
1000.0000 mL | Freq: Once | INTRAVENOUS | Status: AC
  Administered 2019-01-11: 03:00:00 1000 mL via INTRAVENOUS

## 2019-01-11 MED ORDER — LORAZEPAM 2 MG/ML IJ SOLN
1.0000 mg | INTRAMUSCULAR | Status: DC | PRN
  Administered 2019-01-11: 2 mg via INTRAVENOUS
  Administered 2019-01-12: 18:00:00 3 mg via INTRAVENOUS
  Administered 2019-01-12 – 2019-01-13 (×6): 2 mg via INTRAVENOUS
  Filled 2019-01-11 (×9): qty 1

## 2019-01-11 MED ORDER — LORAZEPAM 1 MG PO TABS: 1.0000 mg | ORAL_TABLET | ORAL | Status: DC | PRN

## 2019-01-11 MED ORDER — HEPARIN (PORCINE) 25000 UT/250ML-% IV SOLN
900.0000 [IU]/h | INTRAVENOUS | Status: DC
  Administered 2019-01-11: 17:00:00 1200 [IU]/h via INTRAVENOUS
  Administered 2019-01-12 – 2019-01-13 (×2): 900 [IU]/h via INTRAVENOUS
  Filled 2019-01-11 (×3): qty 250

## 2019-01-11 MED ORDER — LABETALOL HCL 5 MG/ML IV SOLN
10.0000 mg | INTRAVENOUS | Status: DC | PRN
  Administered 2019-01-14 – 2019-01-16 (×5): 10 mg via INTRAVENOUS
  Filled 2019-01-11 (×4): qty 4

## 2019-01-11 NOTE — Progress Notes (Signed)
ANTICOAGULATION CONSULT NOTE - Initial Consult  Pharmacy Consult for heparin Indication: H/o atrial thrombus  Allergies  Allergen Reactions  . Amoxicillin-Pot Clavulanate Nausea And Vomiting    Did it involve swelling of the face/tongue/throat, SOB, or low BP? No Did it involve sudden or severe rash/hives, skin peeling, or any reaction on the inside of your mouth or nose? No Did you need to seek medical attention at a hospital or doctor's office? No When did it last happen?6 months ago If all above answers are "NO", may proceed with cephalosporin use.    Patient Measurements: Height: 6\' 3"  (190.5 cm) Weight: 181 lb (82.1 kg) IBW/kg (Calculated) : 84.5  Vital Signs: Temp: 91.6 F (33.1 C) (12/25 1150) Temp Source: Rectal (12/25 1150) BP: 134/87 (12/25 1400) Pulse Rate: 72 (12/25 1500)  Labs: Recent Labs    01/09/19 2303 01/10/19 0731  HGB 12.2* 12.7*  HCT 38.4* 39.5  PLT 223 230  CREATININE 1.99* 2.01*    Estimated Creatinine Clearance: 47.1 mL/min (A) (by C-G formula based on SCr of 2.01 mg/dL (H)).   Medical History: Past Medical History:  Diagnosis Date  . Diabetes mellitus without complication ([place])   . Heart attack ([place])   . Hepatitis C   . HLD (hyperlipidemia)   . HTN (hypertension)   . Peripheral vascular disease ([place])   . Systolic CHF ([place])     Medications:  Medications Prior to Admission  Medication Sig Dispense Refill Last Dose  . insulin NPH Human (HUMULIN N) 100 UNIT/ML injection Inject 0.02 mLs (2 Units total) into the skin 2 (two) times daily before a meal. 10 mL 11 unk  . amLODipine (NORVASC) 10 MG tablet Take 1 tablet (10 mg total) by mouth daily. 30 tablet 0 unk  . atorvastatin (LIPITOR) 40 MG tablet Take 1 tablet (40 mg total) by mouth at bedtime. 30 tablet 0 unk  . carvedilol (COREG) 12.5 MG tablet Take 1 tablet (12.5 mg total) by mouth 2 (two) times daily. 60 tablet 0 unk  . ferrous sulfate 325 (65 FE) MG tablet Take 1 tablet (325 mg  total) by mouth daily with breakfast. 30 tablet 3 unk  . gabapentin (NEURONTIN) 300 MG capsule Take 2 capsules (600 mg total) by mouth 3 (three) times daily. 90 capsule 0 unk  . losartan (COZAAR) 25 MG tablet Take 1 tablet (25 mg total) by mouth daily. 30 tablet 0 unk  . nicotine (NICODERM CQ - DOSED IN MG/24 HOURS) 21 mg/24hr patch Place 1 patch (21 mg total) onto the skin daily. (Patient not taking: Reported on 09/30/2018) 30 patch 1 Not Taking at Unknown time  . polyethylene glycol (MIRALAX / GLYCOLAX) 17 g packet Take 17 g by mouth daily. 14 each 0 unk  . Rivaroxaban 15 & 20 MG TBPK Follow package directions: Take one 15mg  tablet by mouth twice a day. On day 22, switch to one 20mg  tablet once a day. Take with food. 51 each 0 unk  . senna-docusate (SENOKOT-S) 8.6-50 MG tablet Take 2 tablets by mouth 2 (two) times daily. 30 tablet 0 unk    Assessment: 59 YOM admitted with COVID pneumonia on Xarelto for h/o LV thrombus. Patient is now NPO and pharmacy consulted to transition him to IV heparin. Last dose of Xarelto was yesterday evening.   SCr 2.01. H/H low stable. Plt wnl.  Goal of Therapy:  Heparin level 0.3-0.7 units/ml aPTT 66-102 seconds Monitor platelets by anticoagulation protocol: Yes   Plan:  -Start IV heparin at  1200 units/hr -F/u 8 hr aPTT  -Daily aPTT, HL, CBC  -MOnitor for s/s of bleeding   Albertina Parr, PharmD., BCPS Clinical Pharmacist Clinical phone for 01/11/19 until 5pm: [phone]

## 2019-01-11 NOTE — Progress Notes (Signed)
Spoke with MD Vanita Ingles on pt BP with systolic in the [id number] along with low temp of 92.2. Md said he would order bolus fluids and warming blanket, and he will be in to see the patient. Safety maintained.

## 2019-01-11 NOTE — Progress Notes (Signed)
MD Vanita Ingles came in to see pt, Bp still showing in the 80s.. Order in place for 1000 ml NS bolus, along with warming blanket. Per MD to notify on how pt doing following bolus. MD was also notified of pt elevated QTC. I let MD know that EKG was completed showing prolonged QT. MD said OK.

## 2019-01-11 NOTE — Plan of Care (Signed)
  Problem: Education: Goal: Knowledge of General Education information will improve Description: Including pain rating scale, medication(s)/side effects and non-pharmacologic comfort measures Outcome: Progressing   Problem: Health Behavior/Discharge Planning: Goal: Ability to manage health-related needs will improve Outcome: Progressing   Problem: Clinical Measurements: Goal: Ability to maintain clinical measurements within normal limits will improve Outcome: Progressing Goal: Will remain free from infection Outcome: Progressing Goal: Diagnostic test results will improve Outcome: Progressing Goal: Respiratory complications will improve Outcome: Progressing Goal: Cardiovascular complication will be avoided Outcome: Progressing   Problem: Safety: Goal: Ability to remain free from injury will improve Outcome: Progressing

## 2019-01-11 NOTE — Progress Notes (Signed)
Spoke with MD Vanita Ingles (who was returning my page). Informed MD that bolus is complete and pt systolic still in the 80 with diastolic sustained in the 60s. Per MD to continue to monitor pt for now. No new orders or maintenance fluid at this time. Charge nurse notified. Continuing to monitor pt. Safety maintained.

## 2019-01-11 NOTE — Progress Notes (Signed)
  Echocardiogram 2D Echocardiogram limited has been performed.  Darlina Sicilian M 01/11/2019, 8:05 AM

## 2019-01-11 NOTE — Progress Notes (Signed)
PROGRESS NOTE  Daniel Moon [id number] DOB: 1961-01-07 DOA: [id number] PCP: Sanjuana Letters, MD  HPI/Recap of past 24 hours: Patient is a 58 year old male with past medical history of diabetes mellitus type 2, polysubstance abuse, chronic systolic heart failure with ejection fraction of 30-35%, recent admission in September 2020 for MRSA bacteremia from left BKA infection and at that time a TEE showed left ventricular thrombus.  He presented to the emergency room on 12/19 with complaints of shortness of breath and left leg pain and had a [place] Covid positive test, but insisted on going home.  He returned back on 12/21 night with increased shortness of breath and lethargy.  At that time, he admitted he had taken heroin.  CT of his chest noted multifocal airway disease and signs of pulmonary arterial hypertension plus patient noted to be hypoxic.  He was started on Covid treatment plus antibiotics and then transferred to [place] on 12/24.  Assessment/Plan: Principal Problem:   Acute respiratory failure with hypoxia (HCC) secondary to COVID-19 viral infection/acute on chronic systolic heart failure: Not much response to IV Lasix.  Renal function has declined suggesting that his heart failure may be mild.  In the meantime, treating with remdesivir, Actemra and IV steroids plus supplemental oxygen.  He may be third spacing much of his fluids.  Until breathing is better stabilized, continue steroids.  Ammonia level normal. Active Problems:   HTN (hypertension)   Diabetes ([place]): CBG stable.  Currently NPO.    COPD (chronic obstructive pulmonary disease) (HCC)   PAD (peripheral artery disease) (HCC) status post left BKA. Acute kidney injury in the setting of chronic renal insufficiency, stage 3 (moderate): Likely from diuresis.  Following output and may need to stop IV Lasix if his renal function continues to worsen.  Left BKA stump infection with MRSA discharged 3 months ago:  Completed Zyvox.  Hypoalbuminemia: Received albumin x1.  Nutrition to see.    Paroxysmal atrial fibrillation (HCC)/history of atrial thrombus: Currently in normal sinus rhythm, at times tachycardic.  Have stopped Xarelto and currently on heparin until he is able to take p.o.  Repeat echo pending.  Polysubstance abuse: Initially was given methadone sparingly, because of prolonged QTC.  Since he is quite delirious and unable to take p.o., have stopped this.  Have started CIWA protocol in the interim.  Prolonged QTC: Following EKGs.  Code Status: Full code  Family Communication: Updated daughter by phone  Disposition Plan: Hopefully can stabilize breathing and mentation   Consultants:  None  Procedures:  None  Antimicrobials:  IV Remdisivir 12/22-present  IV Rocephin 12/22-present  IV Zithromax 12/22-12/23  Oral doxycycline 12/23-12/24  IV doxycycline 12/25-present  DVT prophylaxis: Xarelto, changed to IV heparin   Objective: Vitals:   01/11/19 1400 01/11/19 1500  BP: 134/87   Pulse: 64 72  Resp: 16 (!) 21  Temp:    SpO2: 98% 97%    Intake/Output Summary (Last 24 hours) at 01/11/2019 1517 Last data filed at 01/11/2019 1024 Gross per 24 hour  Intake 1013.5 ml  Output 200 ml  Net 813.5 ml   Filed Weights   01/10/19 0704 01/10/19 2100 01/11/19 0500  Weight: 81.1 kg 81.4 kg 82.1 kg   Body mass index is 22.62 kg/m.  Exam:   General: Lethargic, no acute distress  HEENT: Normocephalic, atraumatic, poor dentition, mucous membranes are dry  Neck: Supple, no JVD  Cardiovascular: Regular rate and rhythm, as 1 S2, frequent ectopic beats  Respiratory: Poor inspiratory effort,  decreased breath sounds bibasilar  Abdomen: Soft, nondistended, hypoactive bowel sounds  Musculoskeletal: Status post left BKA.  Trace pitting edema  Skin: Patient has a small superficial skin tear on his right toe  Psychiatry: Currently groggy, delirious   Data  Reviewed: CBC: Recent Labs  Lab 01/03/2019 1943 01/08/2019 2139 01/08/19 0813 01/09/19 2303 01/10/19 0731  WBC 4.8  --  3.7* 15.3* 16.5*  NEUTROABS 2.8  --  2.9  --   --   HGB 14.8 15.6 12.9* 12.2* 12.7*  HCT 48.1 46.0 41.7 38.4* 39.5  MCV 67.4*  --  66.9* 65.9* 64.9*  PLT 267  --  233 223 [id number]   Basic Metabolic Panel: Recent Labs  Lab 01/02/2019 1943 12/19/2018 2139 01/08/19 0813 01/09/19 2303 01/10/19 0731  NA 138 141 137 139 139  K 5.2* 4.3 4.8 4.7 4.8  CL 103  --  102 104 104  CO2 23  --  24 24 22   GLUCOSE 78  --  126* 125* 142*  BUN 19  --  19 38* 41*  CREATININE 1.54*  --  1.52* 1.99* 2.01*  CALCIUM 8.1*  --  8.0* 7.6* 7.7*   GFR: Estimated Creatinine Clearance: 47.1 mL/min (A) (by C-G formula based on SCr of 2.01 mg/dL (H)). Liver Function Tests: Recent Labs  Lab 12/30/2018 1943 01/08/19 0813 01/09/19 2303 01/10/19 0731  AST 69* 48* 28 32  ALT 37 27 22 23   ALKPHOS 154* 129* 105 109  BILITOT 1.9* 1.4* 0.8 0.9  PROT 8.3* 6.8 6.3* 6.4*  ALBUMIN 2.1* 1.7* 1.6* 1.6*   No results for input(s): LIPASE, AMYLASE in the last 168 hours. Recent Labs  Lab 01/10/19 1200  AMMONIA 27   Coagulation Profile: No results for input(s): INR, PROTIME in the last 168 hours. Cardiac Enzymes: No results for input(s): CKTOTAL, CKMB, CKMBINDEX, TROPONINI in the last 168 hours. BNP (last 3 results) No results for input(s): PROBNP in the last 8760 hours. HbA1C: No results for input(s): HGBA1C in the last 72 hours. CBG: Recent Labs  Lab 01/10/19 2223 01/11/19 0001 01/11/19 0436 01/11/19 0730 01/11/19 1306  GLUCAP 106* 81 105* 102* 93   Lipid Profile: No results for input(s): CHOL, HDL, LDLCALC, TRIG, CHOLHDL, LDLDIRECT in the last 72 hours. Thyroid Function Tests: No results for input(s): TSH, T4TOTAL, FREET4, T3FREE, THYROIDAB in the last 72 hours. Anemia Panel: No results for input(s): VITAMINB12, FOLATE, FERRITIN, TIBC, IRON, RETICCTPCT in the last 72 hours. Urine  analysis:    Component Value Date/Time   COLORURINE AMBER (A) 09/30/2018 0051   APPEARANCEUR CLOUDY (A) 09/30/2018 0051   LABSPEC 1.024 09/30/2018 0051   PHURINE 5.0 09/30/2018 0051   GLUCOSEU NEGATIVE 09/30/2018 0051   HGBUR NEGATIVE 09/30/2018 0051   BILIRUBINUR NEGATIVE 09/30/2018 [place] 09/30/2018 0051   PROTEINUR 100 (A) 09/30/2018 0051   NITRITE NEGATIVE 09/30/2018 0051   LEUKOCYTESUR NEGATIVE 09/30/2018 0051   Sepsis Labs: @LABRCNTIP (procalcitonin:4,lacticidven:4)  ) Recent Results (from the past 240 hour(s))  Blood Culture (routine x 2)     Status: None (Preliminary result)   Collection Time: 01/10/2019  7:20 PM   Specimen: BLOOD  Result Value Ref Range Status   Specimen Description BLOOD RIGHT ANTECUBITAL  Final   Special Requests   Final    BOTTLES DRAWN AEROBIC ONLY Blood Culture results may not be optimal due to an inadequate volume of blood received in culture bottles   Culture   Final    NO GROWTH 4 DAYS  Performed at [facility], [place] [address], [place], [place] [address]    Report Status PENDING  Incomplete  Blood Culture (routine x 2)     Status: None (Preliminary result)   Collection Time: 12/31/2018  7:25 PM   Specimen: BLOOD  Result Value Ref Range Status   Specimen Description BLOOD RIGHT UPPER ARM  Final   Special Requests   Final    BOTTLES DRAWN AEROBIC AND ANAEROBIC Blood Culture results may not be optimal due to an inadequate volume of blood received in culture bottles   Culture   Final    NO GROWTH 4 DAYS Performed at [facility], [place] [address] [place], [place] [address]    Report Status PENDING  Incomplete  MRSA PCR Screening     Status: None   Collection Time: 01/09/19  4:01 PM   Specimen: Nasopharyngeal  Result Value Ref Range Status   MRSA by PCR NEGATIVE NEGATIVE Final    Comment:        The GeneXpert MRSA Assay (FDA approved for NASAL specimens only), is one component of a comprehensive MRSA  colonization surveillance program. It is not intended to diagnose MRSA infection nor to guide or monitor treatment for MRSA infections. Performed at [facility], [place] [address], [place], [place] [address]       Studies: No results found.  Scheduled Meds: . feeding supplement (ENSURE ENLIVE)  237 mL Oral BID BM  . feeding supplement (PRO-STAT SUGAR FREE 64)  30 mL Oral BID  . insulin aspart  0-9 Units Subcutaneous Q4H  . multivitamin with minerals  1 tablet Oral Daily    Continuous Infusions: . cefTRIAXone (ROCEPHIN)  IV 1 g (01/11/19 1306)  . remdesivir 100 mg in NS 100 mL 100 mg (01/11/19 1051)     LOS: 4 days     Annita Brod, MD [facility]  To reach me or the doctor on call, go to: [email or web address] Password [facility]  01/11/2019, 3:17 PM

## 2019-01-12 DIAGNOSIS — E162 Hypoglycemia, unspecified: Secondary | ICD-10-CM | POA: Diagnosis not present

## 2019-01-12 LAB — CULTURE, BLOOD (ROUTINE X 2)
Culture: NO GROWTH
Culture: NO GROWTH

## 2019-01-12 LAB — COMPREHENSIVE METABOLIC PANEL
ALT: 23 U/L (ref 0–44)
AST: 35 U/L (ref 15–41)
Albumin: 1.9 g/dL — ABNORMAL LOW (ref 3.5–5.0)
Alkaline Phosphatase: 118 U/L (ref 38–126)
Anion gap: 15 (ref 5–15)
BUN: 61 mg/dL — ABNORMAL HIGH (ref 6–20)
CO2: 23 mmol/L (ref 22–32)
Calcium: 7.8 mg/dL — ABNORMAL LOW (ref 8.9–10.3)
Chloride: 105 mmol/L (ref 98–111)
Creatinine, Ser: 2.23 mg/dL — ABNORMAL HIGH (ref 0.61–1.24)
GFR calc Af Amer: 37 mL/min — ABNORMAL LOW (ref >60)
GFR calc non Af Amer: 32 mL/min — ABNORMAL LOW (ref >60)
Glucose, Bld: 93 mg/dL (ref 70–99)
Potassium: 4.4 mmol/L (ref 3.5–5.1)
Sodium: 143 mmol/L (ref 135–145)
Total Bilirubin: 1 mg/dL (ref 0.3–1.2)
Total Protein: 6.4 g/dL — ABNORMAL LOW (ref 6.5–8.1)

## 2019-01-12 LAB — GLUCOSE, CAPILLARY
Glucose-Capillary: 108 mg/dL — ABNORMAL HIGH (ref 70–99)
Glucose-Capillary: 150 mg/dL — ABNORMAL HIGH (ref 70–99)
Glucose-Capillary: 155 mg/dL — ABNORMAL HIGH (ref 70–99)
Glucose-Capillary: 171 mg/dL — ABNORMAL HIGH (ref 70–99)
Glucose-Capillary: 60 mg/dL — ABNORMAL LOW (ref 70–99)
Glucose-Capillary: 66 mg/dL — ABNORMAL LOW (ref 70–99)
Glucose-Capillary: 90 mg/dL (ref 70–99)
Glucose-Capillary: 160 mg/dL — ABNORMAL HIGH (ref 70–99)

## 2019-01-12 LAB — CBC
HCT: 38.5 % — ABNORMAL LOW (ref 39.0–52.0)
Hemoglobin: 12.6 g/dL — ABNORMAL LOW (ref 13.0–17.0)
MCH: 20.9 pg — ABNORMAL LOW (ref 26.0–34.0)
MCHC: 32.7 g/dL (ref 30.0–36.0)
MCV: 63.7 fL — ABNORMAL LOW (ref 80.0–100.0)
Platelets: 196 10*3/uL (ref 150–400)
RBC: 6.04 MIL/uL — ABNORMAL HIGH (ref 4.22–5.81)
RDW: 21.6 % — ABNORMAL HIGH (ref 11.5–15.5)
WBC: 11.9 10*3/uL — ABNORMAL HIGH (ref 4.0–10.5)
nRBC: 2.9 % — ABNORMAL HIGH (ref 0.0–0.2)

## 2019-01-12 LAB — HEPARIN LEVEL (UNFRACTIONATED)
Heparin Unfractionated: >2.2 IU/mL — ABNORMAL HIGH (ref 0.30–0.70)
Heparin Unfractionated: 1.88 IU/mL — ABNORMAL HIGH (ref 0.30–0.70)

## 2019-01-12 LAB — APTT
aPTT: 198 seconds (ref 24–36)
aPTT: 119 seconds — ABNORMAL HIGH (ref 24–36)
aPTT: 98 seconds — ABNORMAL HIGH (ref 24–36)

## 2019-01-12 LAB — D-DIMER, QUANTITATIVE: D-Dimer, Quant: 9.39 ug/mL-FEU — ABNORMAL HIGH (ref 0.00–0.50)

## 2019-01-12 LAB — C-REACTIVE PROTEIN: CRP: 2.7 mg/dL — ABNORMAL HIGH (ref <1.0)

## 2019-01-12 LAB — BRAIN NATRIURETIC PEPTIDE: B Natriuretic Peptide: 842.2 pg/mL — ABNORMAL HIGH (ref 0.0–100.0)

## 2019-01-12 MED ORDER — DEXTROSE 50 % IV SOLN
INTRAVENOUS | Status: AC
  Administered 2019-01-12: 50 mL via INTRAVENOUS
  Filled 2019-01-12: qty 50

## 2019-01-12 MED ORDER — FUROSEMIDE 10 MG/ML IJ SOLN
20.0000 mg | Freq: Two times a day (BID) | INTRAMUSCULAR | Status: DC
  Administered 2019-01-12 – 2019-01-16 (×8): 20 mg via INTRAVENOUS
  Filled 2019-01-12 (×8): qty 2

## 2019-01-12 MED ORDER — DEXTROSE 50 % IV SOLN
INTRAVENOUS | Status: AC
  Administered 2019-01-12: 50 mL
  Filled 2019-01-12: qty 50

## 2019-01-12 MED ORDER — DEXTROSE 50 % IV SOLN: 1.0000 | Freq: Once | INTRAVENOUS | Status: AC

## 2019-01-12 MED ORDER — DEXTROSE-NACL 5-0.9 % IV SOLN: INTRAVENOUS | Status: DC

## 2019-01-12 MED ORDER — ALBUMIN HUMAN 25 % IV SOLN
12.5000 g | Freq: Once | INTRAVENOUS | Status: AC
  Administered 2019-01-12: 15:00:00 12.5 g via INTRAVENOUS
  Filled 2019-01-12: qty 50

## 2019-01-12 NOTE — TOC Initial Note (Signed)
Transition of Care [facility]) - Initial/Assessment Note    Patient Details  Name: Daniel Moon MRN: [id number] Date of Birth: 04-11-1960  Transition of Care [facility]) CM/SW Contact:    Shade Flood, LCSW Phone Number: 01/12/2019, 11:59 AM  Clinical Narrative:                  Pt admitted from home. He lives alone. He has a L BKA and history of AODA concerns. Pt currently with altered mental status. Reviewed pt's record today.  DC timeframe not yet known. Anticipating dc home with HH once stable.   Expected Discharge Plan: No Name Barriers to Discharge: Continued Medical Work up   Patient Goals and CMS Choice        Expected Discharge Plan and Services Expected Discharge Plan: [place]       Living arrangements for the past 2 months: Single Family Home                                      Prior Living Arrangements/Services Living arrangements for the past 2 months: Single Family Home Lives with:: Self Patient language and need for interpreter reviewed:: Yes        Need for Family Participation in Patient Care: No (Comment) Care giver support system in place?: No (comment)   Criminal Activity/Legal Involvement Pertinent to Current Situation/Hospitalization: No - Comment as needed  Activities of Daily Living Home Assistive Devices/Equipment: None ADL Screening (condition at time of admission) Patient's cognitive ability adequate to safely complete daily activities?: No Is the patient deaf or have difficulty hearing?: Yes Does the patient have difficulty seeing, even when wearing glasses/contacts?: No Does the patient have difficulty concentrating, remembering, or making decisions?: Yes Patient able to express need for assistance with ADLs?: Yes Does the patient have difficulty dressing or bathing?: Yes Independently performs ADLs?: No Does the patient have difficulty walking or climbing stairs?: Yes Weakness of Legs:  Left Weakness of Arms/Hands: None  Permission Sought/Granted                  Emotional Assessment       Orientation: : Oriented to Self Alcohol / Substance Use: Illicit Drugs Psych Involvement: No (comment)  Admission diagnosis:  Confusion [R41.0] Acute respiratory failure with hypoxia ([place]) [J96.01] AKI (acute kidney injury) ([place]) [N17.9] Multifocal pneumonia [J18.9] COVID-19 virus infection [U07.1] Patient Active Problem List   Diagnosis Date Noted  . Polysubstance abuse ([place]) 01/11/2019  . COVID-19 virus infection 01/08/2019  . AKI (acute kidney injury) ([place])   . Acute respiratory failure with hypoxia ([place]) 12/27/2018  . Drug-seeking behavior 10/08/2018  . MRSA bacteremia 10/01/2018  . PAD (peripheral artery disease) ([place]) 10/01/2018  . Peripheral neuropathy 10/01/2018  . Cigarette smoker 10/01/2018  . S/P transmetatarsal amputation of foot, left ([place]) 10/01/2018  . Pressure injury of skin 10/01/2018  . Chronic renal insufficiency, stage 3 (moderate) 10/01/2018  . Paroxysmal atrial fibrillation ([place]) 10/01/2018  . Unintentional weight loss 10/01/2018  . Poor dentition 10/01/2018  . Sepsis ([place]) 09/30/2018  . Open wound of left foot 09/30/2018  . Osteomyelitis ([place]) 09/30/2018  . Leukocytosis 09/30/2018  . Fever 09/30/2018  . Microcytic anemia 09/30/2018  . HTN (hypertension) 02/23/2018  . Diabetes ([place]) 02/23/2018  . HLD (hyperlipidemia) 02/23/2018  . COPD (chronic obstructive pulmonary disease) ([place]) 02/23/2018   PCP:  Sanjuana Letters, MD  Pharmacy:   [name] [place] [place] - [address] [place] [address] Phone: [phone] Fax: [phone]     Social Determinants of Health ([place]) Interventions    Readmission Risk Interventions No flowsheet data found.

## 2019-01-12 NOTE — Progress Notes (Signed)
[place] for heparin Indication: H/o atrial thrombus  Allergies  Allergen Reactions  . Amoxicillin-Pot Clavulanate Nausea And Vomiting    Did it involve swelling of the face/tongue/throat, SOB, or low BP? No Did it involve sudden or severe rash/hives, skin peeling, or any reaction on the inside of your mouth or nose? No Did you need to seek medical attention at a hospital or doctor's office? No When did it last happen?6 months ago If all above answers are "NO", may proceed with cephalosporin use.   Patient Measurements: Height: 6\' 3"  (190.5 cm) Weight: 162 lb 0.6 oz (73.5 kg) IBW/kg (Calculated) : 84.5  Vital Signs: Temp: 97.6 F (36.4 C) (12/26 1136) Temp Source: Axillary (12/26 1136) BP: 135/107 (12/26 1136) Pulse Rate: 73 (12/26 1136)  Labs: Recent Labs    01/09/19 2303 01/10/19 0731 01/11/19 2248 01/12/19 0247 01/12/19 1030  HGB 12.2* 12.7*  --  12.6*  --   HCT 38.4* 39.5  --  38.5*  --   PLT 223 230  --  196  --   APTT  --   --  198*  --  119*  CREATININE 1.99* 2.01*  --  2.23*  --     Estimated Creatinine Clearance: 38 mL/min (A) (by C-G formula based on SCr of 2.23 mg/dL (H)).  Assessment: 32 YOM admitted with COVID pneumonia on Xarelto for h/o LV thrombus. Patient is now NPO and pharmacy consulted to transition him to IV heparin. Last dose of Xarelto was 12/24 pm. Utillizing PTT for heparin monitoring as anti-Xa levels likely affected by Xarelto.  12/26 - Repeat PTT 119 sec on reduced rate of 950 units/hr.  No noted bleeding complications and CBC remains stable.  He is just above desired goal range with PTT - and heparin level not obtained.  Goal of Therapy:  Heparin level 0.3-0.7 units/ml aPTT 66-102 seconds Monitor platelets by anticoagulation protocol: Yes   Plan:  Will drop rate slightly to IV Heparin at 900 units/hr F/U AM Heparin level and PTT to ensure therapeutic response.   Rober Minion, PharmD.,  MS Clinical Pharmacist  Thank you for allowing pharmacy to be part of this patients care team. Please see amion for complete clinical pharmacist phone list 01/12/2019 1:16 PM

## 2019-01-12 NOTE — Progress Notes (Signed)
[place] for heparin Indication: H/o atrial thrombus  Allergies  Allergen Reactions  . Amoxicillin-Pot Clavulanate Nausea And Vomiting    Did it involve swelling of the face/tongue/throat, SOB, or low BP? No Did it involve sudden or severe rash/hives, skin peeling, or any reaction on the inside of your mouth or nose? No Did you need to seek medical attention at a hospital or doctor's office? No When did it last happen?6 months ago If all above answers are "NO", may proceed with cephalosporin use.   Patient Measurements: Height: 6\' 3"  (190.5 cm) Weight: 162 lb 0.6 oz (73.5 kg) IBW/kg (Calculated) : 84.5  Vital Signs: Temp: 97.6 F (36.4 C) (12/26 1529) Temp Source: Axillary (12/26 1529) BP: 140/101 (12/26 2000) Pulse Rate: 78 (12/26 2100)  Labs: Recent Labs    01/10/19 0731 01/11/19 2248 01/12/19 0247 01/12/19 1030 01/12/19 2150  HGB 12.7*  --  12.6*  --   --   HCT 39.5  --  38.5*  --   --   PLT 230  --  196  --   --   APTT  --  198*  --  119* 98*  HEPARINUNFRC  --   --   --  >2.20*  --   CREATININE 2.01*  --  2.23*  --   --     Estimated Creatinine Clearance: 38 mL/min (A) (by C-G formula based on SCr of 2.23 mg/dL (H)).  Assessment: 40 YOM admitted with COVID pneumonia on Xarelto for h/o LV thrombus. Patient is now NPO and pharmacy consulted to transition him to IV heparin. Last dose of Xarelto was 12/24 pm. Utillizing PTT for heparin monitoring as anti-Xa levels likely affected by Xarelto.  PTT 98 sec (therapeutic) on gtt at 900 units/hr. Heparin level >2.2 (affected by Xarelto). No bleeding noted.  Goal of Therapy:  Heparin level 0.3-0.7 units/ml aPTT 66-102 seconds Monitor platelets by anticoagulation protocol: Yes   Plan:  Continue Heparin at 900 units/hr F/U daily Heparin level, PTT, and CBC  Sherlon Handing, PharmD, BCPS Thank you for allowing pharmacy to be part of this patients care team. Please see amion for  complete clinical pharmacist phone list 01/12/2019 11:09 PM

## 2019-01-12 NOTE — Progress Notes (Signed)
[place] for heparin Indication: H/o atrial thrombus  Allergies  Allergen Reactions  . Amoxicillin-Pot Clavulanate Nausea And Vomiting    Did it involve swelling of the face/tongue/throat, SOB, or low BP? No Did it involve sudden or severe rash/hives, skin peeling, or any reaction on the inside of your mouth or nose? No Did you need to seek medical attention at a hospital or doctor's office? No When did it last happen?6 months ago If all above answers are "NO", may proceed with cephalosporin use.    Patient Measurements: Height: 6\' 3"  (190.5 cm) Weight: 181 lb (82.1 kg) IBW/kg (Calculated) : 84.5  Vital Signs: Temp: 97 F (36.1 C) (12/26 0000) Temp Source: Rectal (12/26 0000) BP: 148/106 (12/26 0300) Pulse Rate: 87 (12/26 0300)  Labs: Recent Labs    01/09/19 2303 01/10/19 0731 01/11/19 2248  HGB 12.2* 12.7*  --   HCT 38.4* 39.5  --   PLT 223 230  --   APTT  --   --  198*  CREATININE 1.99* 2.01*  --     Estimated Creatinine Clearance: 47.1 mL/min (A) (by C-G formula based on SCr of 2.01 mg/dL (H)).  Assessment: 20 YOM admitted with COVID pneumonia on Xarelto for h/o LV thrombus. Patient is now NPO and pharmacy consulted to transition him to IV heparin. Last dose of Xarelto was 12/24 pm. Utillizing PTT for heparin monitoring as anti-Xa levels likely affected by Xarelto.  PTT 198 sec (supratherapeutic) - appears to have been drawn correctly. No bleeding noted.  Goal of Therapy:  Heparin level 0.3-0.7 units/ml aPTT 66-102 seconds Monitor platelets by anticoagulation protocol: Yes   Plan:  During downtime (~0030), verbal order to RN to hold heparin at 1 hour which was done from 0030-0130. At 0130, heparin restarted at decreased rate of 950 units/hr. F/u 8 hr aPTT and heparin level   Sherlon Handing, PharmD, BCPS Please see amion for complete clinical pharmacist phone list 01/12/2019 3:30 AM

## 2019-01-12 NOTE — Progress Notes (Signed)
PROGRESS NOTE  Daniel Moon [id number] DOB: 1960-12-27 DOA: [id number] PCP: Sanjuana Letters, MD  HPI/Recap of past 24 hours: Patient is a 58 year old male with past medical history of diabetes mellitus type 2, polysubstance abuse, chronic systolic heart failure with ejection fraction of 30-35%, recent admission in September 2020 for MRSA bacteremia from left BKA infection and at that time a TEE showed left ventricular thrombus.  He presented to the emergency room on 12/19 with complaints of shortness of breath and left leg pain and had a [place] Covid positive test, but insisted on going home.  He returned back on 12/21 night with increased shortness of breath and lethargy.  At that time, he admitted he had taken heroin.  CT of his chest noted multifocal airway disease and signs of pulmonary arterial hypertension plus patient noted to be hypoxic.  He was started on Covid treatment plus antibiotics and then transferred to [facility] on 12/24.  Since transfer, patient has been more confused, lethargic.  Currently on 2.5 L.  Minimal output with IV Lasix.  Repeat echocardiogram confirms ejection fraction of 25% and persistent atrial thrombus.  Noted to be in a little bit more distress with decreased O2 sats when bed is more flat.  By changing bed to more upright, this improved his oxygen saturation and decreased his heart rate/respiratory rate and he seemed to be slightly less agitated.  Due to his n.p.o. status, has had episodes of hypoglycemia and have given doses of D50.  Limited in our ability to give him D5 fluids because of heart failure.  Assessment/Plan: Principal Problem:   Acute respiratory failure with hypoxia (HCC) secondary to COVID-19 viral infection/acute on chronic systolic heart failure: Not much response to IV Lasix.  Worsening renal function and minimal urine output.  I expect that this is because a lot of his IV fluids are third spacing.  In the meantime, treating with  remdesivir, Actemra and IV steroids plus supplemental oxygen.  He may be third spacing much of his fluids.  Until breathing is better stabilized, continue steroids.  We will try dose of IV albumin and then try IV Lasix.  Ammonia level normal. Active Problems:   HTN (hypertension)   Diabetes ([place]): Episodes of hypoglycemia.  See above.  On hypoglycemia protocol.  Limited in her ability give him D5 fluids given volume overload  Acute encephalopathy: Felt to be secondary to Covid and hypoxia    COPD (chronic obstructive pulmonary disease) (HCC)   PAD (peripheral artery disease) (HCC) status post left BKA. Acute kidney injury in the setting of chronic renal insufficiency, stage 3 (moderate): Likely from diuresis.  Very poor output.  Continue IV Lasix, but after IV albumin  Left BKA stump infection with MRSA discharged 3 months ago: Completed Zyvox.  Hypoalbuminemia: Repeating IV albumin nutrition to see.    Paroxysmal atrial fibrillation (HCC)/history of atrial thrombus: Currently in normal sinus rhythm, at times tachycardic.  Have stopped Xarelto and currently on heparin until he is able to take p.o. repeat echo confirms thrombus.  Polysubstance abuse: Initially was given methadone sparingly, because of prolonged QTC.  Since he is quite delirious and unable to take p.o., have stopped this.  Have started CIWA protocol in the interim.  Prolonged QTC: Following EKGs.  Code Status: Full code  Family Communication: Left message for daughter  Disposition Plan: Hopefully can stabilize breathing and mentation   Consultants:  None  Procedures:  None  Antimicrobials:  IV Remdisivir 12/22-present  IV Rocephin  12/22-present  IV Zithromax 12/22-12/23  Oral doxycycline 12/23-12/24  IV doxycycline 12/25-present  DVT prophylaxis: Xarelto, changed to IV heparin   Objective: Vitals:   01/12/19 1330 01/12/19 1529  BP:  (!) 146/90  Pulse: 68 97  Resp: 12 18  Temp:  97.6 F (36.4  C)  SpO2: 94% 91%    Intake/Output Summary (Last 24 hours) at 01/12/2019 1550 Last data filed at 01/12/2019 1519 Gross per 24 hour  Intake 1497.35 ml  Output 2425 ml  Net -927.65 ml   Filed Weights   01/11/19 0500 01/12/19 0407 01/12/19 1035  Weight: 82.1 kg 52.8 kg 73.5 kg   Body mass index is 20.25 kg/m.  Exam:    General: Lethargic, mild distress from hypoxia, improved when he was sat upright  HEENT: Normocephalic, atraumatic, poor dentition, mucous membranes are dry  Neck: Supple, no JVD  Cardiovascular: Regular rhythm, mild tachycardia, S1 S2, frequent ectopic beats  Respiratory: Bilateral rails  Abdomen: Soft, nondistended, hypoactive bowel sounds  Musculoskeletal: Status post left BKA.  Trace pitting edema  Skin: Patient has a small superficial skin tear on his right toe  Psychiatry: Currently groggy, delirious   Data Reviewed: CBC: Recent Labs  Lab 01/11/2019 1943 12/21/2018 2139 01/08/19 0813 01/09/19 2303 01/10/19 0731 01/12/19 0247  WBC 4.8  --  3.7* 15.3* 16.5* 11.9*  NEUTROABS 2.8  --  2.9  --   --   --   HGB 14.8 15.6 12.9* 12.2* 12.7* 12.6*  HCT 48.1 46.0 41.7 38.4* 39.5 38.5*  MCV 67.4*  --  66.9* 65.9* 64.9* 63.7*  PLT 267  --  233 223 230 [id number]   Basic Metabolic Panel: Recent Labs  Lab 01/08/2019 1943 01/05/2019 2139 01/08/19 0813 01/09/19 2303 01/10/19 0731 01/11/19 1119 01/12/19 0247  NA 138 141 137 139 139  --  143  K 5.2* 4.3 4.8 4.7 4.8  --  4.4  CL 103  --  102 104 104  --  105  CO2 23  --  24 24 22   --  23  GLUCOSE 78  --  126* 125* 142*  --  93  BUN 19  --  19 38* 41*  --  61*  CREATININE 1.54*  --  1.52* 1.99* 2.01*  --  2.23*  CALCIUM 8.1*  --  8.0* 7.6* 7.7*  --  7.8*  MG  --   --   --   --   --  1.9  --   PHOS  --   --   --   --   --  5.6*  --    GFR: Estimated Creatinine Clearance: 38 mL/min (A) (by C-G formula based on SCr of 2.23 mg/dL (H)). Liver Function Tests: Recent Labs  Lab 01/14/2019 1943 01/08/19 0813  01/09/19 2303 01/10/19 0731 01/12/19 0247  AST 69* 48* 28 32 35  ALT 37 27 22 23 23   ALKPHOS 154* 129* 105 109 118  BILITOT 1.9* 1.4* 0.8 0.9 1.0  PROT 8.3* 6.8 6.3* 6.4* 6.4*  ALBUMIN 2.1* 1.7* 1.6* 1.6* 1.9*   No results for input(s): LIPASE, AMYLASE in the last 168 hours. Recent Labs  Lab 01/10/19 1200  AMMONIA 27   Coagulation Profile: No results for input(s): INR, PROTIME in the last 168 hours. Cardiac Enzymes: No results for input(s): CKTOTAL, CKMB, CKMBINDEX, TROPONINI in the last 168 hours. BNP (last 3 results) No results for input(s): PROBNP in the last 8760 hours. HbA1C: No results for input(s): HGBA1C in the  last 72 hours. CBG: Recent Labs  Lab 01/12/19 0140 01/12/19 0323 01/12/19 0755 01/12/19 0839 01/12/19 1118  GLUCAP 108* 90 66* 171* 155*   Lipid Profile: No results for input(s): CHOL, HDL, LDLCALC, TRIG, CHOLHDL, LDLDIRECT in the last 72 hours. Thyroid Function Tests: No results for input(s): TSH, T4TOTAL, FREET4, T3FREE, THYROIDAB in the last 72 hours. Anemia Panel: No results for input(s): VITAMINB12, FOLATE, FERRITIN, TIBC, IRON, RETICCTPCT in the last 72 hours. Urine analysis:    Component Value Date/Time   COLORURINE AMBER (A) 09/30/2018 0051   APPEARANCEUR CLOUDY (A) 09/30/2018 0051   LABSPEC 1.024 09/30/2018 0051   PHURINE 5.0 09/30/2018 0051   GLUCOSEU NEGATIVE 09/30/2018 0051   HGBUR NEGATIVE 09/30/2018 0051   BILIRUBINUR NEGATIVE 09/30/2018 [place] 09/30/2018 0051   PROTEINUR 100 (A) 09/30/2018 0051   NITRITE NEGATIVE 09/30/2018 0051   LEUKOCYTESUR NEGATIVE 09/30/2018 0051   Sepsis Labs: @LABRCNTIP (procalcitonin:4,lacticidven:4)  ) Recent Results (from the past 240 hour(s))  Blood Culture (routine x 2)     Status: None   Collection Time: 12/25/2018  7:20 PM   Specimen: BLOOD  Result Value Ref Range Status   Specimen Description BLOOD RIGHT ANTECUBITAL  Final   Special Requests   Final    BOTTLES DRAWN AEROBIC  ONLY Blood Culture results may not be optimal due to an inadequate volume of blood received in culture bottles   Culture   Final    NO GROWTH 5 DAYS Performed at [facility], [place] [address] [place], [place] [address]    Report Status 01/12/2019 FINAL  Final  Blood Culture (routine x 2)     Status: None   Collection Time: 12/31/2018  7:25 PM   Specimen: BLOOD  Result Value Ref Range Status   Specimen Description BLOOD RIGHT UPPER ARM  Final   Special Requests   Final    BOTTLES DRAWN AEROBIC AND ANAEROBIC Blood Culture results may not be optimal due to an inadequate volume of blood received in culture bottles   Culture   Final    NO GROWTH 5 DAYS Performed at [facility], [place] [address], [place], [place] [address]    Report Status 01/12/2019 FINAL  Final  MRSA PCR Screening     Status: None   Collection Time: 01/09/19  4:01 PM   Specimen: Nasopharyngeal  Result Value Ref Range Status   MRSA by PCR NEGATIVE NEGATIVE Final    Comment:        The GeneXpert MRSA Assay (FDA approved for NASAL specimens only), is one component of a comprehensive MRSA colonization surveillance program. It is not intended to diagnose MRSA infection nor to guide or monitor treatment for MRSA infections. Performed at [facility], [place] [address], [place], [place] [address]       Studies: No results found.  Scheduled Meds: . feeding supplement (ENSURE ENLIVE)  237 mL Oral BID BM  . feeding supplement (PRO-STAT SUGAR FREE 64)  30 mL Oral BID  . furosemide  20 mg Intravenous BID  . insulin aspart  0-9 Units Subcutaneous Q4H  . multivitamin with minerals  1 tablet Oral Daily    Continuous Infusions: . cefTRIAXone (ROCEPHIN)  IV Stopped (01/12/19 1418)  . heparin 900 Units/hr (01/12/19 1519)     LOS: 5 days     Annita Brod, MD [facility]  To reach me or the doctor on call, go to: [email or web address] Password [email or web address]  01/12/2019, 3:50 PM

## 2019-01-13 ENCOUNTER — Inpatient Hospital Stay (HOSPITAL_COMMUNITY): Payer: Medicaid Other

## 2019-01-13 DIAGNOSIS — J431 Panlobular emphysema: Secondary | ICD-10-CM

## 2019-01-13 DIAGNOSIS — U071 COVID-19: Principal | ICD-10-CM

## 2019-01-13 DIAGNOSIS — I469 Cardiac arrest, cause unspecified: Secondary | ICD-10-CM

## 2019-01-13 DIAGNOSIS — N183 Chronic kidney disease, stage 3 unspecified: Secondary | ICD-10-CM

## 2019-01-13 DIAGNOSIS — J9601 Acute respiratory failure with hypoxia: Secondary | ICD-10-CM

## 2019-01-13 LAB — CBC
HCT: 38.9 % — ABNORMAL LOW (ref 39.0–52.0)
Hemoglobin: 12.6 g/dL — ABNORMAL LOW (ref 13.0–17.0)
MCH: 20.8 pg — ABNORMAL LOW (ref 26.0–34.0)
MCHC: 32.4 g/dL (ref 30.0–36.0)
MCV: 64.3 fL — ABNORMAL LOW (ref 80.0–100.0)
Platelets: 169 10*3/uL (ref 150–400)
RBC: 6.05 MIL/uL — ABNORMAL HIGH (ref 4.22–5.81)
RDW: 21.7 % — ABNORMAL HIGH (ref 11.5–15.5)
WBC: 7.6 10*3/uL (ref 4.0–10.5)
nRBC: 1.7 % — ABNORMAL HIGH (ref 0.0–0.2)

## 2019-01-13 LAB — POCT I-STAT 7, (LYTES, BLD GAS, ICA,H+H)
Acid-Base Excess: 5 mmol/L — ABNORMAL HIGH (ref 0.0–2.0)
Bicarbonate: 29.2 mmol/L — ABNORMAL HIGH (ref 20.0–28.0)
Calcium, Ion: 1.01 mmol/L — ABNORMAL LOW (ref 1.15–1.40)
HCT: 45 % (ref 39.0–52.0)
Hemoglobin: 15.3 g/dL (ref 13.0–17.0)
O2 Saturation: 99 %
Patient temperature: 101.2
Potassium: 4 mmol/L (ref 3.5–5.1)
Sodium: 150 mmol/L — ABNORMAL HIGH (ref 135–145)
TCO2: 30 mmol/L (ref 22–32)
pCO2 arterial: 44.1 mmHg (ref 32.0–48.0)
pH, Arterial: 7.435 (ref 7.350–7.450)
pO2, Arterial: 139 mmHg — ABNORMAL HIGH (ref 83.0–108.0)

## 2019-01-13 LAB — COMPREHENSIVE METABOLIC PANEL
ALT: 21 U/L (ref 0–44)
ALT: 31 U/L (ref 0–44)
AST: 31 U/L (ref 15–41)
AST: 77 U/L — ABNORMAL HIGH (ref 15–41)
Albumin: 2.1 g/dL — ABNORMAL LOW (ref 3.5–5.0)
Albumin: 2 g/dL — ABNORMAL LOW (ref 3.5–5.0)
Alkaline Phosphatase: 109 U/L (ref 38–126)
Alkaline Phosphatase: 104 U/L (ref 38–126)
Anion gap: 11 (ref 5–15)
Anion gap: 13 (ref 5–15)
BUN: 52 mg/dL — ABNORMAL HIGH (ref 6–20)
BUN: 50 mg/dL — ABNORMAL HIGH (ref 6–20)
CO2: 27 mmol/L (ref 22–32)
CO2: 26 mmol/L (ref 22–32)
Calcium: 7.9 mg/dL — ABNORMAL LOW (ref 8.9–10.3)
Calcium: 7.3 mg/dL — ABNORMAL LOW (ref 8.9–10.3)
Chloride: 107 mmol/L (ref 98–111)
Chloride: 110 mmol/L (ref 98–111)
Creatinine, Ser: 2.02 mg/dL — ABNORMAL HIGH (ref 0.61–1.24)
Creatinine, Ser: 2.08 mg/dL — ABNORMAL HIGH (ref 0.61–1.24)
GFR calc Af Amer: 41 mL/min — ABNORMAL LOW (ref >60)
GFR calc Af Amer: 40 mL/min — ABNORMAL LOW (ref >60)
GFR calc non Af Amer: 36 mL/min — ABNORMAL LOW (ref >60)
GFR calc non Af Amer: 34 mL/min — ABNORMAL LOW (ref >60)
Glucose, Bld: 71 mg/dL (ref 70–99)
Glucose, Bld: 197 mg/dL — ABNORMAL HIGH (ref 70–99)
Potassium: 3.8 mmol/L (ref 3.5–5.1)
Potassium: 4.1 mmol/L (ref 3.5–5.1)
Sodium: 145 mmol/L (ref 135–145)
Sodium: 149 mmol/L — ABNORMAL HIGH (ref 135–145)
Total Bilirubin: 1 mg/dL (ref 0.3–1.2)
Total Bilirubin: 1.6 mg/dL — ABNORMAL HIGH (ref 0.3–1.2)
Total Protein: 6.6 g/dL (ref 6.5–8.1)
Total Protein: 6.3 g/dL — ABNORMAL LOW (ref 6.5–8.1)

## 2019-01-13 LAB — CBC WITH DIFFERENTIAL/PLATELET
Abs Immature Granulocytes: 0.06 10*3/uL (ref 0.00–0.07)
Basophils Absolute: 0 10*3/uL (ref 0.0–0.1)
Basophils Relative: 0 %
Eosinophils Absolute: 0 10*3/uL (ref 0.0–0.5)
Eosinophils Relative: 0 %
HCT: 38.3 % — ABNORMAL LOW (ref 39.0–52.0)
Hemoglobin: 11.9 g/dL — ABNORMAL LOW (ref 13.0–17.0)
Immature Granulocytes: 1 %
Lymphocytes Relative: 2 %
Lymphs Abs: 0.2 10*3/uL — ABNORMAL LOW (ref 0.7–4.0)
MCH: 20.4 pg — ABNORMAL LOW (ref 26.0–34.0)
MCHC: 31.1 g/dL (ref 30.0–36.0)
MCV: 65.7 fL — ABNORMAL LOW (ref 80.0–100.0)
Monocytes Absolute: 0.2 10*3/uL (ref 0.1–1.0)
Monocytes Relative: 3 %
Neutro Abs: 8 10*3/uL — ABNORMAL HIGH (ref 1.7–7.7)
Neutrophils Relative %: 94 %
Platelets: 150 10*3/uL (ref 150–400)
RBC: 5.83 MIL/uL — ABNORMAL HIGH (ref 4.22–5.81)
RDW: 21.1 % — ABNORMAL HIGH (ref 11.5–15.5)
WBC: 8.4 10*3/uL (ref 4.0–10.5)
nRBC: 3.6 % — ABNORMAL HIGH (ref 0.0–0.2)

## 2019-01-13 LAB — D-DIMER, QUANTITATIVE: D-Dimer, Quant: 12.22 ug/mL-FEU — ABNORMAL HIGH (ref 0.00–0.50)

## 2019-01-13 LAB — GLUCOSE, CAPILLARY
Glucose-Capillary: 188 mg/dL — ABNORMAL HIGH (ref 70–99)
Glucose-Capillary: 195 mg/dL — ABNORMAL HIGH (ref 70–99)
Glucose-Capillary: 200 mg/dL — ABNORMAL HIGH (ref 70–99)
Glucose-Capillary: 219 mg/dL — ABNORMAL HIGH (ref 70–99)
Glucose-Capillary: 224 mg/dL — ABNORMAL HIGH (ref 70–99)
Glucose-Capillary: 88 mg/dL (ref 70–99)
Glucose-Capillary: 88 mg/dL (ref 70–99)
Glucose-Capillary: 89 mg/dL (ref 70–99)
Glucose-Capillary: 97 mg/dL (ref 70–99)

## 2019-01-13 LAB — MAGNESIUM
Magnesium: 1.8 mg/dL (ref 1.7–2.4)
Magnesium: 1.9 mg/dL (ref 1.7–2.4)

## 2019-01-13 LAB — TROPONIN I (HIGH SENSITIVITY)
Troponin I (High Sensitivity): 110 ng/L (ref <18)
Troponin I (High Sensitivity): 146 ng/L (ref <18)

## 2019-01-13 LAB — LACTIC ACID, PLASMA
Lactic Acid, Venous: 5.2 mmol/L (ref 0.5–1.9)
Lactic Acid, Venous: 2.6 mmol/L (ref 0.5–1.9)

## 2019-01-13 LAB — PHOSPHORUS
Phosphorus: 4.1 mg/dL (ref 2.5–4.6)
Phosphorus: 2.4 mg/dL — ABNORMAL LOW (ref 2.5–4.6)

## 2019-01-13 LAB — C-REACTIVE PROTEIN: CRP: 2.2 mg/dL — ABNORMAL HIGH (ref <1.0)

## 2019-01-13 LAB — HEPARIN LEVEL (UNFRACTIONATED): Heparin Unfractionated: 1.98 IU/mL — ABNORMAL HIGH (ref 0.30–0.70)

## 2019-01-13 LAB — APTT: aPTT: 77 seconds — ABNORMAL HIGH (ref 24–36)

## 2019-01-13 MED ORDER — STERILE WATER FOR INJECTION IJ SOLN
INTRAMUSCULAR | Status: AC
  Filled 2019-01-13: qty 10

## 2019-01-13 MED ORDER — NOREPINEPHRINE 4 MG/250ML-% IV SOLN
INTRAVENOUS | Status: AC
  Administered 2019-01-13: 8 mg via INTRAVENOUS
  Filled 2019-01-13: qty 250

## 2019-01-13 MED ORDER — ETOMIDATE 2 MG/ML IV SOLN
INTRAVENOUS | Status: AC
  Filled 2019-01-13: qty 20

## 2019-01-13 MED ORDER — PRO-STAT SUGAR FREE PO LIQD
30.0000 mL | Freq: Two times a day (BID) | ORAL | Status: DC
  Administered 2019-01-13 – 2019-01-14 (×2): 30 mL
  Filled 2019-01-13 (×3): qty 30

## 2019-01-13 MED ORDER — MIDAZOLAM HCL 2 MG/2ML IJ SOLN
INTRAMUSCULAR | Status: AC
  Filled 2019-01-13: qty 4

## 2019-01-13 MED ORDER — LACTATED RINGERS BOLUS PEDS
500.0000 mL | Freq: Once | INTRAVENOUS | Status: AC
  Administered 2019-01-13: 500 mL via INTRAVENOUS

## 2019-01-13 MED ORDER — MIDAZOLAM HCL 2 MG/2ML IJ SOLN
2.0000 mg | INTRAMUSCULAR | Status: DC | PRN
  Administered 2019-01-14 (×2): 2 mg via INTRAVENOUS
  Filled 2019-01-13 (×3): qty 2

## 2019-01-13 MED ORDER — THIAMINE HCL 100 MG/ML IJ SOLN
100.0000 mg | Freq: Every day | INTRAMUSCULAR | Status: DC
  Administered 2019-01-13 – 2019-01-26 (×14): 100 mg via INTRAVENOUS
  Filled 2019-01-13 (×14): qty 2

## 2019-01-13 MED ORDER — VITAL HIGH PROTEIN PO LIQD
1000.0000 mL | ORAL | Status: DC
  Administered 2019-01-13 – 2019-01-14 (×2): 1000 mL

## 2019-01-13 MED ORDER — ORAL CARE MOUTH RINSE
15.0000 mL | OROMUCOSAL | Status: DC
  Administered 2019-01-13 – 2019-01-26 (×121): 15 mL via OROMUCOSAL

## 2019-01-13 MED ORDER — SUCCINYLCHOLINE CHLORIDE 200 MG/10ML IV SOSY
PREFILLED_SYRINGE | INTRAVENOUS | Status: AC
  Filled 2019-01-13: qty 10

## 2019-01-13 MED ORDER — CHLORHEXIDINE GLUCONATE 0.12% ORAL RINSE (MEDLINE KIT)
15.0000 mL | Freq: Two times a day (BID) | OROMUCOSAL | Status: DC
  Administered 2019-01-13 – 2019-01-21 (×17): 15 mL via OROMUCOSAL

## 2019-01-13 MED ORDER — ROCURONIUM BROMIDE 10 MG/ML (PF) SYRINGE
PREFILLED_SYRINGE | INTRAVENOUS | Status: AC
  Filled 2019-01-13: qty 10

## 2019-01-13 MED ORDER — FENTANYL 2500MCG IN NS 250ML (10MCG/ML) PREMIX INFUSION
50.0000 ug/h | INTRAVENOUS | Status: DC
  Administered 2019-01-13 – 2019-01-17 (×4): 50 ug/h via INTRAVENOUS
  Filled 2019-01-13 (×2): qty 250

## 2019-01-13 MED ORDER — FENTANYL CITRATE (PF) 100 MCG/2ML IJ SOLN
50.0000 ug | Freq: Once | INTRAMUSCULAR | Status: AC
  Administered 2019-01-13: 50 ug via INTRAVENOUS
  Filled 2019-01-13: qty 2

## 2019-01-13 MED ORDER — FAMOTIDINE IN NACL 20-0.9 MG/50ML-% IV SOLN
20.0000 mg | INTRAVENOUS | Status: DC
  Administered 2019-01-13 – 2019-01-18 (×6): 20 mg via INTRAVENOUS
  Filled 2019-01-13 (×6): qty 50

## 2019-01-13 MED ORDER — FENTANYL CITRATE (PF) 100 MCG/2ML IJ SOLN
INTRAMUSCULAR | Status: AC
  Filled 2019-01-13: qty 2

## 2019-01-13 MED ORDER — PROPOFOL 10 MG/ML IV BOLUS
INTRAVENOUS | Status: AC
  Filled 2019-01-13: qty 20

## 2019-01-13 MED ORDER — FENTANYL BOLUS VIA INFUSION
50.0000 ug | INTRAVENOUS | Status: DC | PRN
  Administered 2019-01-14 (×5): 50 ug via INTRAVENOUS
  Filled 2019-01-13: qty 50

## 2019-01-13 MED ORDER — MIDAZOLAM HCL 2 MG/2ML IJ SOLN: 2.0000 mg | INTRAMUSCULAR | Status: DC | PRN

## 2019-01-13 MED ORDER — CHLORHEXIDINE GLUCONATE CLOTH 2 % EX PADS
6.0000 | MEDICATED_PAD | Freq: Every day | CUTANEOUS | Status: DC
  Administered 2019-01-13 – 2019-01-29 (×16): 6 via TOPICAL

## 2019-01-13 MED ORDER — NOREPINEPHRINE 4 MG/250ML-% IV SOLN
0.0000 ug/min | INTRAVENOUS | Status: DC
  Administered 2019-01-13: 1 ug/min via INTRAVENOUS
  Administered 2019-01-13: 12:00:00 4 ug/min via INTRAVENOUS

## 2019-01-13 MED ORDER — SODIUM CHLORIDE 3 % IN NEBU
4.0000 mL | INHALATION_SOLUTION | Freq: Two times a day (BID) | RESPIRATORY_TRACT | Status: DC
  Administered 2019-01-13 – 2019-01-16 (×4): 4 mL via RESPIRATORY_TRACT
  Filled 2019-01-13 (×8): qty 4

## 2019-01-13 MED ORDER — SODIUM CHLORIDE 3 % IN NEBU
4.0000 mL | INHALATION_SOLUTION | Freq: Two times a day (BID) | RESPIRATORY_TRACT | Status: DC
  Filled 2019-01-13 (×2): qty 4

## 2019-01-13 MED ORDER — VECURONIUM BROMIDE 10 MG IV SOLR
INTRAVENOUS | Status: AC
  Filled 2019-01-13: qty 10

## 2019-01-13 MED ORDER — SODIUM CHLORIDE 0.9 % IV SOLN
1.0000 mg | Freq: Once | INTRAVENOUS | Status: AC
  Administered 2019-01-13: 1 mg via INTRAVENOUS
  Filled 2019-01-13: qty 0.2

## 2019-01-13 NOTE — Progress Notes (Signed)
[facility] PCCM  Noted to have some response to painful stimuli, withdrawal, some eye opening today. No plan for TTM at this time. Current resuscitation efforts at [facility].  Roselie Awkward, MD [place] PCCM Pager: [phone] Cell: [phone] If no response, call [phone]

## 2019-01-13 NOTE — Procedures (Signed)
PCCM Video Bronchoscopy Procedure Note  The patient was informed of the risks (including but not limited to bleeding, infection, respiratory failure, lung injury, tooth/oral injury) and benefits of the procedure and gave consent, see chart.  Indication: mucus plug RLL  Post Procedure Diagnosis: same  Location: [facility] ICU  Condition pre procedure: critically ill, on vent  Medications for procedure: fentanyl infusion  Procedure description: The bronchoscope was introduced through the endotracheal tube and passed to the bilateral lungs to the level of the subsegmental bronchi throughout the tracheobronchial tree.  Airway exam revealed thick grey mucus in the right mainstem bronchus completely occluding the right lower lobe.  The remainder of the exam was normal.  Procedures performed: therapeutic aspiration of mucus  Specimens sent: bronch wash culture  Condition post procedure: critically ill on vent  EBL: none  Complications: none immediate  Roselie Awkward, MD [place] PCCM Pager: [phone] Cell: [phone] If no response, call [phone]

## 2019-01-13 NOTE — Procedures (Signed)
Arterial Catheter Insertion Procedure Note Daniel Moon [id number] 1960/03/14  Procedure: Insertion of Arterial Catheter  Indications: Blood pressure monitoring and Frequent blood sampling  Procedure Details Consent: Unable to obtain consent because of emergent medical necessity. Time Out: Verified patient identification, verified procedure, site/side was marked, verified correct patient position, special equipment/implants available, medications/allergies/relevent history reviewed, required imaging and test results available.  Performed  Maximum sterile technique was used including antiseptics, cap, gloves, gown, hand hygiene, mask and sheet. Skin prep: Chlorhexidine; local anesthetic administered 20 gauge catheter was inserted into left radial artery using the Seldinger technique. ULTRASOUND GUIDANCE USED: NO Evaluation Blood flow good; BP tracing good. Complications: No apparent complications  RT placed arrow catheter on first attempt.Daniel Moon 01/13/2019

## 2019-01-13 NOTE — Progress Notes (Signed)
NAME:  Daniel Moon, MRN:  [id number], DOB:  07/31/1960, LOS: 6 ADMISSION DATE:  01/05/2019, CONSULTATION DATE:  12/27 REFERRING MD:  [place] Pink, CHIEF COMPLAINT:  dyspnea   Brief History   58 y/o male with extensive past medical history admitted on `12/21 with COVID pneumonia and CHF exacerbation.  Had a cardiac arrest on 12/27 in the setting of likely aspiration/inability to clear secretions.  History of present illness   58 y/o male with extensive past medical history admitted on `12/21 with COVID pneumonia and CHF exacerbation.  Had a cardiac arrest on 12/27 in the setting of likely aspiration/inability to clear secretions. No further history could be obtained from the patient because of a cardiac arrest with ongoing resuscitation efforts on my arrival.  Briefly here he has been treated for COVID, CHF with diuresis, and alcohol withdrawal.  He has a history of IV heparin use, MRSA bacteremia, COPD, PAD.   Past Medical History  Afib Polysubstance abuse Paroxysmal atrial fib L BKA stump infection with MRSA DM2 COPD  Significant Hospital Events   12/21 admission 12/27 cardiac arrest, intubation  Consults:  PCCM  Procedures:  12/27 ETT >    Significant Diagnostic Tests:  12/21 CT head > NAICP, some atrophy 12/22 CT chest > extensive bilateral airspace disease, mod effusions bilaterally, mostly free flowing (area of loculation right anteriorly?), enlarged chest lymph nodes, pulmonary arterial hypertension, hepatic steatosis 12/22 V/Q scan > low prob for PE  Micro Data:  12/21 blood >   Antimicrobials:  12.21 remdesivir > 12/26 12/22 azithro >  12/22 ceftriaxone >  12/22 tocilizumab   Interim history/subjective:  As above  Objective   Blood pressure (!) 171/101, pulse (!) 119, temperature 98.6 F (37 C), temperature source Axillary, resp. rate (!) 34, height 6\' 3"  (1.905 m), weight 71.3 kg, SpO2 94 %.    Vent Mode: PRVC FiO2 (%):  [100 %] 100 % Set Rate:  [28  bmp] 28 bmp Vt Set:  [500 mL] 500 mL PEEP:  [12 cmH20] 12 cmH20   Intake/Output Summary (Last 24 hours) at 01/13/2019 1036 Last data filed at 01/13/2019 0800 Gross per 24 hour  Intake 836.26 ml  Output 5000 ml  Net -4163.74 ml   Filed Weights   01/12/19 0407 01/12/19 1035 01/13/19 0434  Weight: 52.8 kg 73.5 kg 71.3 kg    Examination: General:  In bed on vent HENT: NCAT ETT in place PULM: CTA B, vent supported breathing CV: RRR, no mgr GI: BS+, soft, nontender MSK: normal bulk and tone, L BKA Neuro: sedated on vent    Resolved Hospital Problem list     Assessment & Plan:  COVID 19 pneumonia Aspiration pneumonia Right lung white out, mucus plug HCAP Decadron Continue mechanical ventilation per ARDS protocol Target TVol 6-8cc/kgIBW Target Plateau Pressure < 30cm H20 Target driving pressure less than 15 cm of water Target PaO2 55-65: titrate PEEP/FiO2 per protocol As long as PaO2 to FiO2 ratio is less than 1:150 position in prone position for 16 hours a day Check CVP daily if CVL in place Target CVP less than 4, diurese as necessary Ventilator associated pneumonia prevention protocol 12/27 decadron, resp culture, ceftriaxone for now, check ABG  Acute systolic heart failure, now likely cardiogenic shock post cardiac arrest Cardiac arrest due to hypoxemia from inability to clear airway secretions from ongoing acute encephalopathy Sepsis? CVP low 4, but nearly off levophed, lactic acid 4 Place CVL Bolus 500cc saline and re-assess shock, pressor need with systolic  heart failure Levophed titrated to MAP > 65 Place arterial line  Alcohol withdrawal Acute toxic metabolic encephalopathy due to alcohol withdrawal prior to cardiac arrest At risk for anoxic brain injury Monitor  Prn versed RASS goal 0 to -1 Thiamine, folate Monitor neuro exam Try to keep temp around 36  Atrial fib Heparin infusion tele  Rest per [facility]  Best practice:  Diet: tube  feeding Pain/Anxiety/Delirium protocol (if indicated): yes VAP protocol (if indicated): yes DVT prophylaxis: heparin infusion GI prophylaxis: famotidine Glucose control: SSI Mobility: bed rest Code Status: full Family Communication: per [facility] Disposition: remain in ICU  Labs   CBC: Recent Labs  Lab 12/25/2018 1943 01/08/19 0813 01/09/19 2303 01/10/19 0731 01/12/19 0247 01/13/19 0426  WBC 4.8 3.7* 15.3* 16.5* 11.9* 7.6  NEUTROABS 2.8 2.9  --   --   --   --   HGB 14.8 12.9* 12.2* 12.7* 12.6* 12.6*  HCT 48.1 41.7 38.4* 39.5 38.5* 38.9*  MCV 67.4* 66.9* 65.9* 64.9* 63.7* 64.3*  PLT 267 233 223 230 196 [id number]    Basic Metabolic Panel: Recent Labs  Lab 01/08/19 0813 01/09/19 2303 01/10/19 0731 01/11/19 1119 01/12/19 0247 01/13/19 0426  NA 137 139 139  --  143 145  K 4.8 4.7 4.8  --  4.4 3.8  CL 102 104 104  --  105 107  CO2 24 24 22   --  23 27  GLUCOSE 126* 125* 142*  --  93 71  BUN 19 38* 41*  --  61* 52*  CREATININE 1.52* 1.99* 2.01*  --  2.23* 2.02*  CALCIUM 8.0* 7.6* 7.7*  --  7.8* 7.9*  MG  --   --   --  1.9  --   --   PHOS  --   --   --  5.6*  --   --    GFR: Estimated Creatinine Clearance: 40.7 mL/min (A) (by C-G formula based on SCr of 2.02 mg/dL (H)). Recent Labs  Lab 01/05/2019 1943 01/06/2019 2147 01/08/19 0813 01/08/19 1427 01/09/19 2303 01/10/19 0731 01/10/19 1200 01/12/19 0247 01/13/19 0426  PROCALCITON 0.20  --   --   --   --   --   --   --   --   WBC 4.8  --  3.7*  --  15.3* 16.5*  --  11.9* 7.6  LATICACIDVEN 3.0* 2.3* 2.8* 2.2*  --   --  2.0*  --   --     Liver Function Tests: Recent Labs  Lab 01/08/19 0813 01/09/19 2303 01/10/19 0731 01/12/19 0247 01/13/19 0426  AST 48* 28 32 35 31  ALT 27 22 23 23 21   ALKPHOS 129* 105 109 118 109  BILITOT 1.4* 0.8 0.9 1.0 1.0  PROT 6.8 6.3* 6.4* 6.4* 6.6  ALBUMIN 1.7* 1.6* 1.6* 1.9* 2.1*   No results for input(s): LIPASE, AMYLASE in the last 168 hours. Recent Labs  Lab 01/10/19 1200  AMMONIA 27     ABG    Component Value Date/Time   PHART 7.425 01/10/2019 1843   PCO2ART 34.1 01/10/2019 1843   PO2ART 123 (H) 01/10/2019 1843   HCO3 22.0 01/10/2019 1843   TCO2 25 12/31/2018 2139   ACIDBASEDEF 1.7 01/10/2019 1843   O2SAT 98.3 01/10/2019 1843     Coagulation Profile: No results for input(s): INR, PROTIME in the last 168 hours.  Cardiac Enzymes: No results for input(s): CKTOTAL, CKMB, CKMBINDEX, TROPONINI in the last 168 hours.  HbA1C: Hgb A1c MFr Bld  Date/Time Value Ref Range Status  09/30/2018 12:54 AM 8.4 (H) 4.8 - 5.6 % Final    Comment:    (NOTE)         Prediabetes: 5.7 - 6.4         Diabetes: >6.4         Glycemic control for adults with diabetes: <7.0     CBG: Recent Labs  Lab 01/12/19 1622 01/12/19 2058 01/13/19 0015 01/13/19 0429 01/13/19 0746  GLUCAP 160* 97 89 88 88       Critical care time: 40 minutes     Roselie Awkward, MD [place] PCCM Pager: [phone] Cell: [phone] If no response, call [phone]

## 2019-01-13 NOTE — Progress Notes (Signed)
[id number] Holter called RN into room. Patient in PEA. Code blue called.   1019: ROSC achieved.  1246: Patients wife updated. Notified patient in ICU. All questions answered at this time.

## 2019-01-13 NOTE — Progress Notes (Signed)
Responded to Code Blue. CPR in progress when RT arrived. Pt intubated by CCM MD 7.5 at 26cm at the lip. Pt bagged at 100% with peep valve. Once ROSC achieved, pt transported to ICU on ventilator. RT will continue to monitor.

## 2019-01-13 NOTE — Progress Notes (Signed)
[place] for heparin Indication: H/o atrial thrombus  Allergies  Allergen Reactions  . Amoxicillin-Pot Clavulanate Nausea And Vomiting    Did it involve swelling of the face/tongue/throat, SOB, or low BP? No Did it involve sudden or severe rash/hives, skin peeling, or any reaction on the inside of your mouth or nose? No Did you need to seek medical attention at a hospital or doctor's office? No When did it last happen?6 months ago If all above answers are "NO", may proceed with cephalosporin use.   Patient Measurements: Height: 6\' 3"  (190.5 cm) Weight: 157 lb 3 oz (71.3 kg) IBW/kg (Calculated) : 84.5  Vital Signs: Temp: 98.6 F (37 C) (12/27 0600) Temp Source: Axillary (12/27 0600) BP: 154/94 (12/27 0600) Pulse Rate: 105 (12/27 0600)  Labs: Recent Labs    01/12/19 0247 01/12/19 1030 01/12/19 2150 01/13/19 0426  HGB 12.6*  --   --  12.6*  HCT 38.5*  --   --  38.9*  PLT 196  --   --  169  APTT  --  119* 98* 77*  HEPARINUNFRC  --  >2.20* 1.88* 1.98*  CREATININE 2.23*  --   --  2.02*    Estimated Creatinine Clearance: 40.7 mL/min (A) (by C-G formula based on SCr of 2.02 mg/dL (H)).  Assessment: 45 YOM admitted with COVID pneumonia on Xarelto for h/o LV thrombus. Patient is now NPO and pharmacy consulted to transition him to IV heparin. Last dose of Xarelto was 12/24 pm. Utillizing PTT for heparin monitoring as anti-Xa levels likely affected by Xarelto.  PTT 77 sec (therapeutic) on gtt at 900 units/hr. Heparin level =1.98 (affected by Xarelto). No bleeding per RN.  Goal of Therapy:  Heparin level 0.3-0.7 units/ml aPTT 66-102 seconds Monitor platelets by anticoagulation protocol: Yes   Plan:  Continue Heparin at 900 units/hr F/U daily Heparin level, PTT, and CBC  Ulice Dash, PharmD, BCPS 01/13/2019 7:35 AM

## 2019-01-13 NOTE — Progress Notes (Signed)
Rapid Response Event Note  Overview: Overhead page for code team. ICU CN responded. Came to room to find CPR in progress. Dr. Sherral Hammers at bedside until Dr. [place] Pink arrived. ACLS protocol followed. Levophed 4mg  in 232ml mixed and hung and run at 67mcg/min peripherally until central access available. Dr. [place] Bells paged for airway assistance. Intubation performed and patient immediately transferred to ICU  Initial Focused Assessment: Cardiac Arrest  Interventions: ACLS protocol, emergent airway, transfer to ICU for escalation in care.       Daniel Moon

## 2019-01-13 NOTE — Progress Notes (Addendum)
PROGRESS NOTE  Daniel Moon TDD:[id number] DOB: Mar 12, 1960 DOA: [id number] PCP: Sanjuana Letters, MD  HPI/Recap of past 24 hours: Patient is a 58 year old male with past medical history of diabetes mellitus type 2, polysubstance abuse, chronic systolic heart failure with ejection fraction of 30-35%, recent admission in September 2020 for MRSA bacteremia from left BKA infection and at that time a TEE showed left ventricular thrombus.  He presented to the emergency room on 12/19 with complaints of shortness of breath and left leg pain and had a [place] Covid positive test, but insisted on going home.  He returned back on 12/21 night with increased shortness of breath and lethargy.  At that time, he admitted he had taken heroin.  CT of his chest noted multifocal airway disease and signs of pulmonary arterial hypertension plus patient noted to be hypoxic.  He was started on Covid treatment plus antibiotics and then transferred to [place] on 12/24.  Since transfer, patient has been more confused, lethargic.  Currently on 2.5 L.  Minimal output with IV Lasix.  Repeat echocardiogram confirms ejection fraction of 25% and persistent atrial thrombus.  Noted to be in a little bit more distress with decreased O2 sats when bed is more flat.  By changing bed to more upright, this improved his oxygen saturation and decreased his heart rate/respiratory rate and he seemed to be slightly less agitated.  Due to his n.p.o. status, has had episodes of hypoglycemia and have given doses of D50.  Limited in our ability to give him D5 fluids because of heart failure.  On 12/26, patient ordered IV albumin and given Lasix later that day.  Prior to that, his oxygen demand went from 2.5 L to 10.  After Lasix, he diuresed 4 L of fluid and oxygen requirements went down to 6 L.  This morning, he was a bit more agitated and received 1 dose of IV Ativan as per CIWA protocol.  Less than 1 hour later, he became severely  bradycardic and then pulseless and unresponsive.  Looked like PEA.  Code called and team at the bedside.  CPR initiated.  Patient intubated.  Following multiple doses of epinephrine and bicarb, able to obtain pulse.  Started on Levophed drip and transferred to ICU under critical care service.  Follow-up chest x-ray noted near complete whiteout of right lung.  Bronchoscopy done by critical care which suctioned out incredibly large mucous plug.  Central line placed and during that time, patient noted to be somewhat responsive to this.  Assessment/Plan: Principal Problem: PEA arrest: Status post mucous plug.  Status post bronchoscopy.  Now with acute worsening respiratory failure requiring ventilator support.  Hopefully he will able to make a more formal recovery and be able to be extubated from ventilator sooner than later.   Acute respiratory failure with hypoxia (HCC) secondary to COVID-19 viral infection/acute on chronic systolic heart failure: Not much response to IV Lasix.  Worsening renal function and minimal urine output.  I expect that this is because a lot of his IV fluids are third spacing.  In the meantime, treating with remdesivir, Actemra and IV steroids plus supplemental oxygen.  He may be third spacing much of his fluids.  Continue steroids.  Responded well to IV albumin and then Lasix.  Active Problems:   HTN (hypertension)   Diabetes ([place]): Episodes of hypoglycemia.  See above.  On hypoglycemia protocol.  Limited in her ability give him D5 fluids given volume overload  Acute encephalopathy: Felt  to be secondary to Covid and hypoxia    COPD (chronic obstructive pulmonary disease) (HCC)   PAD (peripheral artery disease) (HCC) status post left BKA. Acute kidney injury in the setting of chronic renal insufficiency, stage 3 (moderate): Likely from diuresis without effect.  Following IV albumin, had good response and renal function improved.  Left BKA stump infection with MRSA discharged 3  months ago: Completed Zyvox.  Hypoalbuminemia: Repeating IV albumin.  At some point nutrition will see.    Paroxysmal atrial fibrillation (HCC)/history of atrial thrombus: Currently in normal sinus rhythm, at times tachycardic.  Have stopped Xarelto and currently on heparin until he is able to take p.o. repeat echo confirms thrombus.  Polysubstance abuse: Initially was given methadone sparingly, because of prolonged QTC.  Since he is quite delirious and unable to take p.o., have stopped this.  Have started CIWA protocol in the interim.  Prolonged QTC: Following EKGs.  Time spent: 35 minutes were spent in the care of this critically ill patient including medical decision making, bedside examination, interpretation of his labs, review of his notes, discussion with critical care subspecialty service and resuscitation effort.  Code Status: Full code  Family Communication: Patient's daughter updated by phone  Disposition Plan: Continue in ICU until off of ventilator   Consultants:  None  Procedures:  None  Antimicrobials:  IV Remdisivir 12/22-present  IV Rocephin 12/22-present  IV Zithromax 12/22-12/23  Oral doxycycline 12/23-12/24  IV doxycycline 12/25-present  DVT prophylaxis: Xarelto, changed to IV heparin   Objective: Vitals:   01/13/19 1400 01/13/19 1415  BP: 90/65   Pulse: 76 76  Resp: 20 20  Temp: (!) 97.2 F (36.2 C) (!) 96.8 F (36 C)  SpO2: 100% 100%    Intake/Output Summary (Last 24 hours) at 01/13/2019 1514 Last data filed at 01/13/2019 0800 Gross per 24 hour  Intake 269.56 ml  Output 4400 ml  Net -4130.44 ml   Filed Weights   01/12/19 0407 01/12/19 1035 01/13/19 0434  Weight: 52.8 kg 73.5 kg 71.3 kg   Body mass index is 19.65 kg/m.  Exam:    General: Intubated, at this time not responsive  HEENT: Normocephalic, intubated, poor dentition, mucous membranes are dry  Neck: Supple, no JVD  Cardiovascular: Tachycardic  Respiratory:  Scattered rhonchi, decreased breath sounds right side  Abdomen: Soft, nondistended, hypoactive bowel sounds  Musculoskeletal: Status post left BKA.  Trace pitting edema  Skin: Patient has a small superficial skin tear on his right toe  Psychiatry: Currently not responsive.  Prior to this, agitated and confused   Data Reviewed: CBC: Recent Labs  Lab 12/29/2018 1943 01/08/19 0813 01/09/19 2303 01/10/19 0731 01/12/19 0247 01/13/19 0426 01/13/19 1112  WBC 4.8 3.7* 15.3* 16.5* 11.9* 7.6  --   NEUTROABS 2.8 2.9  --   --   --   --   --   HGB 14.8 12.9* 12.2* 12.7* 12.6* 12.6* 15.3  HCT 48.1 41.7 38.4* 39.5 38.5* 38.9* 45.0  MCV 67.4* 66.9* 65.9* 64.9* 63.7* 64.3*  --   PLT 267 233 223 230 196 169  --    Basic Metabolic Panel: Recent Labs  Lab 01/09/19 2303 01/10/19 0731 01/11/19 1119 01/12/19 0247 01/13/19 0426 01/13/19 1112 01/13/19 1141 01/13/19 1142  NA 139 139  --  143 145 150*  --  149*  K 4.7 4.8  --  4.4 3.8 4.0  --  4.1  CL 104 104  --  105 107  --   --  110  CO2 24 22  --  23 27  --   --  26  GLUCOSE 125* 142*  --  93 71  --   --  197*  BUN 38* 41*  --  61* 52*  --   --  50*  CREATININE 1.99* 2.01*  --  2.23* 2.02*  --   --  2.08*  CALCIUM 7.6* 7.7*  --  7.8* 7.9*  --   --  7.3*  MG  --   --  1.9  --   --   --  1.8  --   PHOS  --   --  5.6*  --   --   --  4.1  --    GFR: Estimated Creatinine Clearance: 39.5 mL/min (A) (by C-G formula based on SCr of 2.08 mg/dL (H)). Liver Function Tests: Recent Labs  Lab 01/09/19 2303 01/10/19 0731 01/12/19 0247 01/13/19 0426 01/13/19 1142  AST 28 32 35 31 77*  ALT '22 23 23 21 31  ' ALKPHOS 105 109 118 109 104  BILITOT 0.8 0.9 1.0 1.0 1.6*  PROT 6.3* 6.4* 6.4* 6.6 6.3*  ALBUMIN 1.6* 1.6* 1.9* 2.1* 2.0*   No results for input(s): LIPASE, AMYLASE in the last 168 hours. Recent Labs  Lab 01/10/19 1200  AMMONIA 27   Coagulation Profile: No results for input(s): INR, PROTIME in the last 168 hours. Cardiac  Enzymes: No results for input(s): CKTOTAL, CKMB, CKMBINDEX, TROPONINI in the last 168 hours. BNP (last 3 results) No results for input(s): PROBNP in the last 8760 hours. HbA1C: No results for input(s): HGBA1C in the last 72 hours. CBG: Recent Labs  Lab 01/13/19 0015 01/13/19 0429 01/13/19 0746 01/13/19 1036 01/13/19 1141  GLUCAP 89 88 88 195* 188*   Lipid Profile: No results for input(s): CHOL, HDL, LDLCALC, TRIG, CHOLHDL, LDLDIRECT in the last 72 hours. Thyroid Function Tests: No results for input(s): TSH, T4TOTAL, FREET4, T3FREE, THYROIDAB in the last 72 hours. Anemia Panel: No results for input(s): VITAMINB12, FOLATE, FERRITIN, TIBC, IRON, RETICCTPCT in the last 72 hours. Urine analysis:    Component Value Date/Time   COLORURINE AMBER (A) 09/30/2018 0051   APPEARANCEUR CLOUDY (A) 09/30/2018 0051   LABSPEC 1.024 09/30/2018 0051   PHURINE 5.0 09/30/2018 0051   GLUCOSEU NEGATIVE 09/30/2018 0051   HGBUR NEGATIVE 09/30/2018 0051   BILIRUBINUR NEGATIVE 09/30/2018 [place] 09/30/2018 0051   PROTEINUR 100 (A) 09/30/2018 0051   NITRITE NEGATIVE 09/30/2018 0051   LEUKOCYTESUR NEGATIVE 09/30/2018 0051   Sepsis Labs: '@LABRCNTIP' (procalcitonin:4,lacticidven:4)  ) Recent Results (from the past 240 hour(s))  Blood Culture (routine x 2)     Status: None   Collection Time: 12/31/2018  7:20 PM   Specimen: BLOOD  Result Value Ref Range Status   Specimen Description BLOOD RIGHT ANTECUBITAL  Final   Special Requests   Final    BOTTLES DRAWN AEROBIC ONLY Blood Culture results may not be optimal due to an inadequate volume of blood received in culture bottles   Culture   Final    NO GROWTH 5 DAYS Performed at [facility], [place] [address], [place], [place] [address]    Report Status 01/12/2019 FINAL  Final  Blood Culture (routine x 2)     Status: None   Collection Time: 01/09/2019  7:25 PM   Specimen: BLOOD  Result Value Ref Range Status   Specimen Description  BLOOD RIGHT UPPER ARM  Final   Special Requests  Final    BOTTLES DRAWN AEROBIC AND ANAEROBIC Blood Culture results may not be optimal due to an inadequate volume of blood received in culture bottles   Culture   Final    NO GROWTH 5 DAYS Performed at [facility], [place] [address], [place], [place] [address]    Report Status 01/12/2019 FINAL  Final  MRSA PCR Screening     Status: None   Collection Time: 01/09/19  4:01 PM   Specimen: Nasopharyngeal  Result Value Ref Range Status   MRSA by PCR NEGATIVE NEGATIVE Final    Comment:        The GeneXpert MRSA Assay (FDA approved for NASAL specimens only), is one component of a comprehensive MRSA colonization surveillance program. It is not intended to diagnose MRSA infection nor to guide or monitor treatment for MRSA infections. Performed at [facility], [place] [address], [place], [place] [address]       Studies: DG Chest 1 View  Result Date: 01/13/2019 CLINICAL DATA:  Intubation granuloma, central line placement, diabetes mellitus, hypertension, CHF, COVID-19 EXAM: CHEST  1 VIEW COMPARISON:  Portable exam 1115 hours compared to 01/09/2019 FINDINGS: Tip of endotracheal tube projects 4.5 cm above carina. Nasogastric tube extends into stomach. LEFT subclavian line with tip projecting over SVC. Volume loss in the RIGHT hemithorax with mediastinal shift to the RIGHT. Normal heart size and pulmonary vascularity. Increased RIGHT lung atelectasis and RIGHT pleural effusion since previous study. Mild peribronchial thickening with clear LEFT lung. Potential LEFT nipple shadow. No pneumothorax or acute osseous findings. IMPRESSION: Increased RIGHT lung atelectasis and pleural effusion with mediastinal shift to the RIGHT. Potential LEFT nipple shadow, recommend attention on follow-up imaging. Electronically Signed   By: Lavonia Dana M.D.   On: 01/13/2019 11:45   DG Abd 1 View  Result Date: 01/13/2019 CLINICAL DATA:  Orogastric tube  placement EXAM: ABDOMEN - 1 VIEW COMPARISON:  Portable exam at 1122 hrs compared to 01/09/2019 FINDINGS: Tip of nasogastric tube projects over stomach though the proximal sideport projects over the distal esophagus, recommend advancing tube 7 cm. Nonobstructive bowel gas pattern. RIGHT pleural effusion and basilar atelectasis. LEFT lung base clear. Osseous structures unremarkable. IMPRESSION: Recommend advancing nasogastric tube 7 cm to place proximal side-port within stomach. Electronically Signed   By: Lavonia Dana M.D.   On: 01/13/2019 11:47    Scheduled Meds: . chlorhexidine gluconate (MEDLINE KIT)  15 mL Mouth Rinse BID  . Chlorhexidine Gluconate Cloth  6 each Topical Daily  . feeding supplement (ENSURE ENLIVE)  237 mL Oral BID BM  . feeding supplement (PRO-STAT SUGAR FREE 64)  30 mL Per Tube BID  . feeding supplement (VITAL HIGH PROTEIN)  1,000 mL Per Tube Q24H  . furosemide  20 mg Intravenous BID  . insulin aspart  0-9 Units Subcutaneous Q4H  . mouth rinse  15 mL Mouth Rinse 10 times per day  . multivitamin with minerals  1 tablet Oral Daily  . sodium chloride HYPERTONIC  4 mL Nebulization BID  . thiamine injection  100 mg Intravenous Daily    Continuous Infusions: . cefTRIAXone (ROCEPHIN)  IV 1 g (01/13/19 1212)  . fentaNYL infusion INTRAVENOUS 50 mcg/hr (01/13/19 1159)  . heparin Stopped (01/13/19 1312)  . lactated ringers    . norepinephrine (LEVOPHED) Adult infusion 1 mcg/min (01/13/19 1434)     LOS: 6 days     Annita Brod, MD [facility]  To reach me or the doctor on call,  go to: [email or web address] Password [email or web address]  01/13/2019, 3:14 PM

## 2019-01-14 ENCOUNTER — Inpatient Hospital Stay (HOSPITAL_COMMUNITY)
Admit: 2019-01-14 | Discharge: 2019-01-14 | Disposition: A | Discharge status: Home / Self Care | Payer: Medicaid Other | Attending: Pulmonary Disease | Admitting: Pulmonary Disease

## 2019-01-14 ENCOUNTER — Inpatient Hospital Stay (HOSPITAL_COMMUNITY): Payer: Medicaid Other

## 2019-01-14 DIAGNOSIS — N179 Acute kidney failure, unspecified: Secondary | ICD-10-CM

## 2019-01-14 DIAGNOSIS — J189 Pneumonia, unspecified organism: Secondary | ICD-10-CM

## 2019-01-14 DIAGNOSIS — F1910 Other psychoactive substance abuse, uncomplicated: Secondary | ICD-10-CM

## 2019-01-14 DIAGNOSIS — Z9911 Dependence on respirator [ventilator] status: Secondary | ICD-10-CM

## 2019-01-14 LAB — CBC
HCT: 41.5 % (ref 39.0–52.0)
Hemoglobin: 13.3 g/dL (ref 13.0–17.0)
MCH: 20.6 pg — ABNORMAL LOW (ref 26.0–34.0)
MCHC: 32 g/dL (ref 30.0–36.0)
MCV: 64.2 fL — ABNORMAL LOW (ref 80.0–100.0)
Platelets: 124 10*3/uL — ABNORMAL LOW (ref 150–400)
RBC: 6.46 MIL/uL — ABNORMAL HIGH (ref 4.22–5.81)
RDW: 22.1 % — ABNORMAL HIGH (ref 11.5–15.5)
WBC: 12.6 10*3/uL — ABNORMAL HIGH (ref 4.0–10.5)
nRBC: 0.8 % — ABNORMAL HIGH (ref 0.0–0.2)

## 2019-01-14 LAB — GLUCOSE, CAPILLARY
Glucose-Capillary: 154 mg/dL — ABNORMAL HIGH (ref 70–99)
Glucose-Capillary: 191 mg/dL — ABNORMAL HIGH (ref 70–99)
Glucose-Capillary: 218 mg/dL — ABNORMAL HIGH (ref 70–99)
Glucose-Capillary: 190 mg/dL — ABNORMAL HIGH (ref 70–99)
Glucose-Capillary: 165 mg/dL — ABNORMAL HIGH (ref 70–99)

## 2019-01-14 LAB — COMPREHENSIVE METABOLIC PANEL
ALT: 29 U/L (ref 0–44)
AST: 45 U/L — ABNORMAL HIGH (ref 15–41)
Albumin: 2 g/dL — ABNORMAL LOW (ref 3.5–5.0)
Alkaline Phosphatase: 117 U/L (ref 38–126)
Anion gap: 11 (ref 5–15)
BUN: 52 mg/dL — ABNORMAL HIGH (ref 6–20)
CO2: 31 mmol/L (ref 22–32)
Calcium: 7.7 mg/dL — ABNORMAL LOW (ref 8.9–10.3)
Chloride: 107 mmol/L (ref 98–111)
Creatinine, Ser: 1.86 mg/dL — ABNORMAL HIGH (ref 0.61–1.24)
GFR calc Af Amer: 46 mL/min — ABNORMAL LOW (ref >60)
GFR calc non Af Amer: 39 mL/min — ABNORMAL LOW (ref >60)
Glucose, Bld: 168 mg/dL — ABNORMAL HIGH (ref 70–99)
Potassium: 3.5 mmol/L (ref 3.5–5.1)
Sodium: 149 mmol/L — ABNORMAL HIGH (ref 135–145)
Total Bilirubin: 0.7 mg/dL (ref 0.3–1.2)
Total Protein: 6.3 g/dL — ABNORMAL LOW (ref 6.5–8.1)

## 2019-01-14 LAB — APTT
aPTT: 28 seconds (ref 24–36)
aPTT: 42 seconds — ABNORMAL HIGH (ref 24–36)
aPTT: 52 seconds — ABNORMAL HIGH (ref 24–36)

## 2019-01-14 LAB — D-DIMER, QUANTITATIVE: D-Dimer, Quant: 12.4 ug/mL-FEU — ABNORMAL HIGH (ref 0.00–0.50)

## 2019-01-14 LAB — MAGNESIUM
Magnesium: 1.6 mg/dL — ABNORMAL LOW (ref 1.7–2.4)
Magnesium: 1.5 mg/dL — ABNORMAL LOW (ref 1.7–2.4)

## 2019-01-14 LAB — PHOSPHORUS
Phosphorus: 1.7 mg/dL — ABNORMAL LOW (ref 2.5–4.6)
Phosphorus: 1.6 mg/dL — ABNORMAL LOW (ref 2.5–4.6)

## 2019-01-14 LAB — HEPARIN LEVEL (UNFRACTIONATED)
Heparin Unfractionated: 0.34 IU/mL (ref 0.30–0.70)
Heparin Unfractionated: 0.39 IU/mL (ref 0.30–0.70)
Heparin Unfractionated: 0.57 IU/mL (ref 0.30–0.70)

## 2019-01-14 LAB — C-REACTIVE PROTEIN: CRP: 2.4 mg/dL — ABNORMAL HIGH (ref <1.0)

## 2019-01-14 MED ORDER — POTASSIUM PHOSPHATES 15 MMOLE/5ML IV SOLN
30.0000 mmol | Freq: Once | INTRAVENOUS | Status: AC
  Administered 2019-01-14: 30 mmol via INTRAVENOUS
  Filled 2019-01-14: qty 10

## 2019-01-14 MED ORDER — LEVETIRACETAM IN NACL 1500 MG/100ML IV SOLN
1500.0000 mg | Freq: Two times a day (BID) | INTRAVENOUS | Status: DC
  Filled 2019-01-14: qty 100

## 2019-01-14 MED ORDER — LORAZEPAM 2 MG/ML IJ SOLN
1.0000 mg | INTRAMUSCULAR | Status: DC | PRN
  Administered 2019-01-14 – 2019-01-19 (×5): 1 mg via INTRAVENOUS
  Filled 2019-01-14 (×6): qty 1

## 2019-01-14 MED ORDER — MAGNESIUM SULFATE 2 GM/50ML IV SOLN
2.0000 g | Freq: Once | INTRAVENOUS | Status: AC
  Administered 2019-01-14: 2 g via INTRAVENOUS
  Filled 2019-01-14: qty 50

## 2019-01-14 MED ORDER — HEPARIN (PORCINE) 25000 UT/250ML-% IV SOLN
1100.0000 [IU]/h | INTRAVENOUS | Status: DC
  Administered 2019-01-15 – 2019-01-16 (×3): 1100 [IU]/h via INTRAVENOUS
  Filled 2019-01-14 (×3): qty 250

## 2019-01-14 MED ORDER — VITAL AF 1.2 CAL PO LIQD
1000.0000 mL | ORAL | Status: DC
  Administered 2019-01-14 – 2019-01-15 (×2): 1000 mL

## 2019-01-14 NOTE — Progress Notes (Signed)
ANTICOAGULATION CONSULT NOTE - Follow Up Consult  Pharmacy Consult for Heparin Indication: H/O artrial thrombus  Allergies  Allergen Reactions  . Amoxicillin-Pot Clavulanate Nausea And Vomiting    Did it involve swelling of the face/tongue/throat, SOB, or low BP? No Did it involve sudden or severe rash/hives, skin peeling, or any reaction on the inside of your mouth or nose? No Did you need to seek medical attention at a hospital or doctor's office? No When did it last happen?6 months ago If all above answers are "NO", may proceed with cephalosporin use.    Patient Measurements: Height: 6\' 3"  (190.5 cm) Weight: 147 lb 0.8 oz (66.7 kg) IBW/kg (Calculated) : 84.5 Heparin Dosing Weight: TBW  Vital Signs: Temp: 97.6 F (36.4 C) (12/28 0730) Temp Source: Oral (12/28 0730) BP: 129/91 (12/28 0700) Pulse Rate: 71 (12/28 0700)  Labs: Recent Labs    01/12/19 2150 01/13/19 0426 01/13/19 1112 01/13/19 1142 01/13/19 1513 01/13/19 1530 01/14/19 0430  HGB  --  12.6* 15.3  --  11.9*  --  13.3  HCT  --  38.9* 45.0  --  38.3*  --  41.5  PLT  --  169  --   --  150  --  124*  APTT 98* 77*  --   --   --   --  28  HEPARINUNFRC 1.88* 1.98*  --   --   --   --  0.34  CREATININE  --  2.02*  --  2.08*  --   --  1.86*  TROPONINIHS  --   --   --  110*  --  146*  --     Estimated Creatinine Clearance: 41.3 mL/min (A) (by C-G formula based on SCr of 1.86 mg/dL (H)).   Medications:  Infusions:  . cefTRIAXone (ROCEPHIN)  IV Stopped (01/13/19 1242)  . famotidine (PEPCID) IV 20 mg (01/13/19 1822)  . fentaNYL infusion INTRAVENOUS 50 mcg/hr (01/14/19 0605)  . heparin 900 Units/hr (01/14/19 0529)  . norepinephrine (LEVOPHED) Adult infusion Stopped (01/13/19 1623)    Assessment: 54 YOM admitted with COVID pneumonia on Xarelto for h/o LV thrombus. Patient is now NPO and pharmacy consulted to transition him to IV heparin. Last dose of Xarelto was 12/24 pm. Utillizing PTT for heparin monitoring as  anti-Xa levels likely affected by Xarelto.  12/28 AM:  Heparin level 0.34, despite heparin off x16 hours.  Heparin was held during code blue event 11/27 from ~1300 and was resumed on 12/28 AM at [place].     aPTT 42 on heparin at 900 units/hr.  Heparin level 0.39 (elevateion likely d/t Xarelto, AKI).  No bleeding or complications per RN. CBC:  Hgb stable, Plt decreased to 124k SCr decreased to 1.86, CrCl ~ 41.  (Baseline SCr 1.2-1.4)  Goal of Therapy:  Heparin level 0.3-0.7 units/ml Monitor platelets by anticoagulation protocol: Yes   Plan:  Increase to heparin IV infusion at 1000 units/hr Heparin level 6 hours after starting Daily heparin level and CBC Continue to monitor H&H and platelets   Gretta Arab PharmD, BCPS Clinical pharmacist phone 7am- 5pm: [phone] 01/14/2019 10:20 AM

## 2019-01-14 NOTE — Progress Notes (Signed)
Daniel Moon (DTR) called and gave her an update on the pt. Did not have questions or concerns. Just wanted to add two more family members to his call list.

## 2019-01-14 NOTE — Progress Notes (Signed)
NAME:  Daniel Moon, MRN:  [id number], DOB:  August 20, 1960, LOS: 7 ADMISSION DATE:  12/21/2018, CONSULTATION DATE:  12/27 REFERRING MD:  [place] Pink, CHIEF COMPLAINT:  dyspnea   Brief History   58 y/o male with extensive past medical history admitted on `12/21 with COVID pneumonia and CHF exacerbation.  Had a cardiac arrest on 12/27 in the setting of likely aspiration/inability to clear secretions.  History of present illness   58 y/o male with extensive past medical history admitted on `12/21 with COVID pneumonia and CHF exacerbation.  Had a cardiac arrest on 12/27 in the setting of likely aspiration/inability to clear secretions. No further history could be obtained from the patient because of a cardiac arrest with ongoing resuscitation efforts on my arrival.  Briefly here he has been treated for COVID, CHF with diuresis, and alcohol withdrawal.  He has a history of IV heparin use, MRSA bacteremia, COPD, PAD.   Past Medical History  Afib Polysubstance abuse Paroxysmal atrial fib L BKA stump infection with MRSA DM2 COPD  Significant Hospital Events   12/21 admission 12/27 cardiac arrest, intubation  Consults:  PCCM  Procedures:  12/27 ETT >    Significant Diagnostic Tests:  12/21 CT head > NAICP, some atrophy 12/22 CT chest > extensive bilateral airspace disease, mod effusions bilaterally, mostly free flowing (area of loculation right anteriorly?), enlarged chest lymph nodes, pulmonary arterial hypertension, hepatic steatosis 12/22 V/Q scan > low prob for PE  Micro Data:  12/21 blood >   Antimicrobials:  12.21 remdesivir > 12/26 12/22 azithro >  12/22 ceftriaxone >  12/22 tocilizumab   Interim history/subjective:  Intubated critically ill on mechanical life support  Objective   Blood pressure 118/83, pulse 80, temperature 98.4 F (36.9 C), temperature source Oral, resp. rate (!) 23, height 6\' 3"  (1.905 m), weight 66.7 kg, SpO2 97 %. CVP:  [1 mmHg-4 mmHg] 3 mmHg    Vent Mode: PRVC FiO2 (%):  [40 %-50 %] 40 % Set Rate:  [20 bmp] 20 bmp Vt Set:  [500 mL] 500 mL PEEP:  [12 cmH20] 12 cmH20 Plateau Pressure:  [15 cmH20-29 cmH20] 19 cmH20   Intake/Output Summary (Last 24 hours) at 01/14/2019 1750 Last data filed at 01/14/2019 1500 Gross per 24 hour  Intake 1179.94 ml  Output 3900 ml  Net -2720.06 ml   Filed Weights   01/12/19 1035 01/13/19 0434 01/14/19 0500  Weight: 73.5 kg 71.3 kg 66.7 kg    Examination: General: Intubated on mechanical life support critically ill HENT: NCAT, fixed upward gaze, endotracheal tube in place PULM: Bilateral ventilated breath sounds CV: Regular rate rhythm, S1-S2, no MRG GI: Soft, nontender MSK: Left BKA, all extremities are very rigid to passive range of motion Neuro: Fixed upward gaze, no response to painful stimuli off all sedation  Resolved Hospital Problem list     Assessment & Plan:   COVID 19 pneumonia Aspiration pneumonia Right lung atelectasis, mucus again, status post bronchoscopy HCAP Continue mechanical ventilation per ARDS protocol Target TVol 6-8cc/kgIBW Target Plateau Pressure < 30cm H20 Target driving pressure less than 15 cm of water Target PaO2 55-65: titrate PEEP/FiO2 per protocol As long as PaO2 to FiO2 ratio is less than 1:150 position in prone position for 16 hours a day Check CVP daily if CVL in place Target CVP less than 4, diurese as necessary Ventilator associated pneumonia prevention protocol Tinea Decadron Continue antimicrobials  Acute systolic heart failure, now likely cardiogenic shock post cardiac arrest Cardiac arrest due to  hypoxemia from inability to clear airway secretions from ongoing acute encephalopathy Shock state resolved Known history of systolic heart failure Acute on chronic systolic heart failure Prior ejection fraction 20 to 25%. We will plan for diuresis to maintain euvolemia  Alcohol withdrawal Acute toxic metabolic encephalopathy due to alcohol  withdrawal prior to cardiac arrest At risk for anoxic brain injury Fixed upward gaze Muscle rigidity As needed Ativan Continue thiamine folate Stat head CT to rule out intracranial process/intracranial bleed EEG If EEG negative plan for MRI Discussed case with neurology today.  Atrial fib Continue heparin infusion Telemetry   Best practice:  Diet: tube feeding Pain/Anxiety/Delirium protocol (if indicated): yes VAP protocol (if indicated): yes DVT prophylaxis: heparin infusion GI prophylaxis: famotidine Glucose control: SSI Mobility: bed rest Code Status: full Family Communication: Attempted to call family there was no answer at the number provided in the chart Disposition: remain in ICU  Labs   CBC: Recent Labs  Lab 12/20/2018 1943 01/08/19 0813 01/11/19 1119 01/12/19 0247 01/13/19 0426 01/13/19 1112 01/13/19 1513 01/14/19 0430  WBC 4.8 3.7* 17.4* 11.9* 7.6  --  8.4 12.6*  NEUTROABS 2.8 2.9  --   --   --   --  8.0*  --   HGB 14.8 12.9* 12.0* 12.6* 12.6* 15.3 11.9* 13.3  HCT 48.1 41.7 38.1* 38.5* 38.9* 45.0 38.3* 41.5  MCV 67.4* 66.9* 65.2* 63.7* 64.3*  --  65.7* 64.2*  PLT 267 233 195 196 169  --  150 124*    Basic Metabolic Panel: Recent Labs  Lab 01/11/19 1119 01/12/19 0247 01/13/19 0426 01/13/19 1112 01/13/19 1141 01/13/19 1142 01/13/19 1530 01/14/19 0430 01/14/19 1600  NA 142 143 145 150*  --  149*  --  149*  --   K 5.1 4.4 3.8 4.0  --  4.1  --  3.5  --   CL 107 105 107  --   --  110  --  107  --   CO2 20* 23 27  --   --  26  --  31  --   GLUCOSE 83 93 71  --   --  197*  --  168*  --   BUN 62* 61* 52*  --   --  50*  --  52*  --   CREATININE 2.18* 2.23* 2.02*  --   --  2.08*  --  1.86*  --   CALCIUM 7.5* 7.8* 7.9*  --   --  7.3*  --  7.7*  --   MG 1.9  --   --   --  1.8  --  1.9 1.6* 1.5*  PHOS 5.6*  --   --   --  4.1  --  2.4* 1.7* 1.6*   GFR: Estimated Creatinine Clearance: 41.3 mL/min (A) (by C-G formula based on SCr of 1.86 mg/dL  (H)). Recent Labs  Lab 12/31/2018 1943 12/20/2018 2147 01/08/19 1427 01/10/19 1200 01/12/19 0247 01/13/19 0426 01/13/19 1142 01/13/19 1513 01/13/19 1654 01/14/19 0430  PROCALCITON 0.20  --   --   --   --   --   --   --   --   --   WBC 4.8   < >  --   --  11.9* 7.6  --  8.4  --  12.6*  LATICACIDVEN 3.0*  --  2.2* 2.0*  --   --  5.2*  --  2.6*  --    < > = values in this interval not  displayed.    Liver Function Tests: Recent Labs  Lab 01/11/19 1119 01/12/19 0247 01/13/19 0426 01/13/19 1142 01/14/19 0430  AST 39 35 31 77* 45*  ALT 25 23 21 31 29   ALKPHOS 112 118 109 104 117  BILITOT 1.2 1.0 1.0 1.6* 0.7  PROT 6.7 6.4* 6.6 6.3* 6.3*  ALBUMIN 2.0* 1.9* 2.1* 2.0* 2.0*   No results for input(s): LIPASE, AMYLASE in the last 168 hours. Recent Labs  Lab 01/10/19 1200  AMMONIA 27    ABG    Component Value Date/Time   PHART 7.435 01/13/2019 1112   PCO2ART 44.1 01/13/2019 1112   PO2ART 139.0 (H) 01/13/2019 1112   HCO3 29.2 (H) 01/13/2019 1112   TCO2 30 01/13/2019 1112   ACIDBASEDEF 1.7 01/10/2019 1843   O2SAT 99.0 01/13/2019 1112     Coagulation Profile: No results for input(s): INR, PROTIME in the last 168 hours.  Cardiac Enzymes: No results for input(s): CKTOTAL, CKMB, CKMBINDEX, TROPONINI in the last 168 hours.  HbA1C: Hgb A1c MFr Bld  Date/Time Value Ref Range Status  09/30/2018 12:54 AM 8.4 (H) 4.8 - 5.6 % Final    Comment:    (NOTE)         Prediabetes: 5.7 - 6.4         Diabetes: >6.4         Glycemic control for adults with diabetes: <7.0     CBG: Recent Labs  Lab 01/13/19 2335 01/14/19 0345 01/14/19 0734 01/14/19 1124 01/14/19 1622  GLUCAP 219* 154* 191* 218* 190*     This patient is critically ill with multiple organ system failure; which, requires frequent high complexity decision making, assessment, support, evaluation, and titration of therapies. This was completed through the application of advanced monitoring technologies and extensive  interpretation of multiple databases. During this encounter critical care time was devoted to patient care services described in this note for 41 minutes.  Garner Nash, DO [facility] 01/14/2019 5:50 PM

## 2019-01-14 NOTE — Progress Notes (Signed)
Heparin gtt restarted per Dr. Vanita Ingles at previous rate of 900 unit/hour.

## 2019-01-14 NOTE — Progress Notes (Signed)
ANTICOAGULATION CONSULT NOTE - Follow Up Consult  Pharmacy Consult for Heparin Indication: H/O artrial thrombus  Allergies  Allergen Reactions  . Amoxicillin-Pot Clavulanate Nausea And Vomiting    Did it involve swelling of the face/tongue/throat, SOB, or low BP? No Did it involve sudden or severe rash/hives, skin peeling, or any reaction on the inside of your mouth or nose? No Did you need to seek medical attention at a hospital or doctor's office? No When did it last happen?6 months ago If all above answers are "NO", may proceed with cephalosporin use.    Patient Measurements: Height: 6\' 3"  (190.5 cm) Weight: 147 lb 0.8 oz (66.7 kg) IBW/kg (Calculated) : 84.5 Heparin Dosing Weight: TBW  Vital Signs: Temp: 98 F (36.7 C) (12/28 2000) Temp Source: Oral (12/28 2000) BP: 107/81 (12/28 2300) Pulse Rate: 75 (12/28 2300)  Labs: Recent Labs    01/13/19 0426 01/13/19 1112 01/13/19 1142 01/13/19 1513 01/13/19 1530 01/14/19 0430 01/14/19 1205 01/14/19 2030  HGB 12.6* 15.3  --  11.9*  --  13.3  --   --   HCT 38.9* 45.0  --  38.3*  --  41.5  --   --   PLT 169  --   --  150  --  124*  --   --   APTT 77*  --   --   --   --  28 42* 52*  HEPARINUNFRC 1.98*  --   --   --   --  0.34 0.39 0.57  CREATININE 2.02*  --  2.08*  --   --  1.86*  --   --   TROPONINIHS  --   --  110*  --  146*  --   --   --     Estimated Creatinine Clearance: 41.3 mL/min (A) (by C-G formula based on SCr of 1.86 mg/dL (H)).   Assessment: 64 YOM admitted with COVID pneumonia on Xarelto for h/o LV thrombus. Patient is now NPO and pharmacy consulted to transition him to IV heparin. Last dose of Xarelto was 12/24 pm. Utillizing PTT for heparin monitoring as anti-Xa levels likely affected by Xarelto.  Heparin level 0.57 units/ml  APTT remain below goal at 52 sec  CT head neg for bleed  Goal of Therapy:  Heparin level 0.3-0.7 units/ml Monitor platelets by anticoagulation protocol: Yes   Plan:  Increase to  heparin IV infusion at 1100 units/hr Heparin level 6 hours after rate change Daily heparin level and CBC Continue to monitor H&H and platelets Thanks for allowing pharmacy to be a part of this patient's care.  Excell Seltzer, PharmD Clinical Pharmacist  01/14/2019 11:18 PM

## 2019-01-14 NOTE — Progress Notes (Signed)
[place] for heparin Indication: H/o atrial thrombus  Heparin turned off during code blue last night.  Okay with Dr Vanita Ingles to restart. Goal of Therapy:  Heparin level 0.3-0.7 units/ml aPTT 66-102 seconds Monitor platelets by anticoagulation protocol: Yes   Plan:  Continue Heparin at 900 units/hr Check heparin level and aPTT ~ 6 hours after restart F/U daily Heparin level, PTT, and CBC  Thanks for allowing pharmacy to be a part of this patient's care.  Excell Seltzer, PharmD Clinical Pharmacist  01/14/2019 5:29 AM

## 2019-01-14 NOTE — Procedures (Signed)
Patient Name: Daniel Moon  MRN: [id number]  Epilepsy Attending: Lora Havens  Referring Physician/Provider: Dr June Leap Date: 01/14/2019 Duration: 21.47 mins  Patient history: 58yo COVID+M with ams. EEG to evaluate for seizure  Level of alertness: comatose  AEDs during EEG study: versed  Technical aspects: This EEG study was done with scalp electrodes positioned according to the 10-20 International system of electrode placement. Electrical activity was acquired at a sampling rate of 500Hz  and reviewed with a high frequency filter of 70Hz  and a low frequency filter of 1Hz . EEG data were recorded continuously and digitally stored.   DESCRIPTION: EEG showed generalized background attenuation. Intermittent 2-3hz  generalized delta slowing was also noted. EEG was reactive to tactile stimulation. Hyperventilation and photic stimulation were not performed.  ABNORMALITY - Background suppression, generalized - Intermittent slow, generalozed  IMPRESSION: This study is suggestive of profound diffuse encephalopathy, non specific to etiology. No seizures or epileptiform discharges were seen throughout the recording.     Daniel Moon

## 2019-01-14 NOTE — Progress Notes (Signed)
Nutrition Follow-up  RD working remotely.  DOCUMENTATION CODES:   Not applicable  INTERVENTION:   -D/c Ensure Enlive po BID, each supplement provides 350 kcal and 20 grams of protein -Continue MVI with minerals daily -D/c Vital High Protein -D/c Prostat  -Initiate Vital AF 1.2 @ 60 ml/hr via OGT  (1440 ml/day)  Tube feeding regimen provides 1728 kcal (97% of needs), 108 grams of protein, and 1204 ml of H2O.   NUTRITION DIAGNOSIS:   Increased nutrient needs related to acute illness(COVID-19) as evidenced by estimated needs.  Ongoing  GOAL:   Patient will meet greater than or equal to 90% of their needs  Progressing   MONITOR:   Vent status, Labs, Weight trends, TF tolerance, Skin, I & O's  REASON FOR ASSESSMENT:   Consult Enteral/tube feeding initiation and management  ASSESSMENT:   58 year old male who presented to the ED on 12/21 with weakness and SOB. Pt also COVID-19 positive. 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  12/27- code blue, intubated; s/p bronchoscopy  Patient is currently intubated on ventilator support MV: 9.8 L/min Temp (24hrs), Avg:96.8 F (36 C), Min:95.7 F (35.4 C), Max:98.4 F (36.9 C)  Reviewed I/O's: -2.2 L x 24 hours and -5.5 L since admission  UOP: 4.6 L x 24 hours  MAP: 94  Per chart review, pt with poor oral intake prior to intubation (PO: 0-25%). Pt also with periods of agitation and decreased responsiveness during this time period. Pt intubated and transferred to ICU secondary to cardiac arrest likely due to inability to clear secretions.   TF via OGT started yesterday: Vital High Protein @ 40 ml/hr and 30 ml Prostat BID, which provides 1160 kcals, 114 grams protein, and 803 ml free water, meeting 65% of estimated kcal needs and 100% of estimated protein needs.   Medications reviewed and include thiamine, fentanyl, and heparin.   Labs  reviewed: CBGS: [id number] (inpatient orders for glycemic control are 0-9 units insulin aspart every 4 hours).   Diet Order:   Diet Order            Diet NPO time specified  Diet effective now              EDUCATION NEEDS:   No education needs have been identified at this time  Skin:  Skin Assessment: Skin Integrity Issues: Skin Integrity Issues:: Other (Comment) Other: s/p left BKA  Last BM:  01/10/19  Height:   Ht Readings from Last 1 Encounters:  01/10/19 6\' 3"  (1.905 m)    Weight:   Wt Readings from Last 1 Encounters:  01/14/19 66.7 kg    Ideal Body Weight:  83.3 kg(adjusted for left BKA)  BMI:  Body mass index is 18.38 kg/m.  Estimated Nutritional Needs:   Kcal:  [id number]  Protein:  100-115 grams  Fluid:  > 1.8 L    Shaira Sova A. Jimmye Norman, RD, LDN, [place] Registered Dietitian II Certified Diabetes Care and Education Specialist Pager: [phone] After hours Pager: [phone]

## 2019-01-14 NOTE — Progress Notes (Signed)
EEG Completed; Results Pending  

## 2019-01-14 NOTE — Progress Notes (Signed)
ETT originally ay 67, now at 29 d/t placing bite block.  Very difficult to place d/t to clamping.  RT will reaccess need to pull tube back when pt settles.  pT in no distress and vent volumes are good.

## 2019-01-15 ENCOUNTER — Inpatient Hospital Stay (HOSPITAL_COMMUNITY): Payer: Medicaid Other

## 2019-01-15 ENCOUNTER — Encounter (HOSPITAL_COMMUNITY): Payer: Self-pay | Admitting: Internal Medicine

## 2019-01-15 LAB — CBC
HCT: 38.5 % — ABNORMAL LOW (ref 39.0–52.0)
Hemoglobin: 12.5 g/dL — ABNORMAL LOW (ref 13.0–17.0)
MCH: 20.7 pg — ABNORMAL LOW (ref 26.0–34.0)
MCHC: 32.5 g/dL (ref 30.0–36.0)
MCV: 63.7 fL — ABNORMAL LOW (ref 80.0–100.0)
Platelets: 114 10*3/uL — ABNORMAL LOW (ref 150–400)
RBC: 6.04 MIL/uL — ABNORMAL HIGH (ref 4.22–5.81)
RDW: 22.3 % — ABNORMAL HIGH (ref 11.5–15.5)
WBC: 18.9 10*3/uL — ABNORMAL HIGH (ref 4.0–10.5)
nRBC: 0.4 % — ABNORMAL HIGH (ref 0.0–0.2)

## 2019-01-15 LAB — GLUCOSE, CAPILLARY
Glucose-Capillary: 120 mg/dL — ABNORMAL HIGH (ref 70–99)
Glucose-Capillary: 182 mg/dL — ABNORMAL HIGH (ref 70–99)
Glucose-Capillary: 192 mg/dL — ABNORMAL HIGH (ref 70–99)
Glucose-Capillary: 196 mg/dL — ABNORMAL HIGH (ref 70–99)
Glucose-Capillary: 212 mg/dL — ABNORMAL HIGH (ref 70–99)
Glucose-Capillary: 214 mg/dL — ABNORMAL HIGH (ref 70–99)

## 2019-01-15 LAB — COMPREHENSIVE METABOLIC PANEL
ALT: 23 U/L (ref 0–44)
AST: 23 U/L (ref 15–41)
Albumin: 2.1 g/dL — ABNORMAL LOW (ref 3.5–5.0)
Alkaline Phosphatase: 110 U/L (ref 38–126)
Anion gap: 11 (ref 5–15)
BUN: 53 mg/dL — ABNORMAL HIGH (ref 6–20)
CO2: 30 mmol/L (ref 22–32)
Calcium: 7.4 mg/dL — ABNORMAL LOW (ref 8.9–10.3)
Chloride: 105 mmol/L (ref 98–111)
Creatinine, Ser: 1.72 mg/dL — ABNORMAL HIGH (ref 0.61–1.24)
GFR calc Af Amer: 50 mL/min — ABNORMAL LOW (ref >60)
GFR calc non Af Amer: 43 mL/min — ABNORMAL LOW (ref >60)
Glucose, Bld: 236 mg/dL — ABNORMAL HIGH (ref 70–99)
Potassium: 3.3 mmol/L — ABNORMAL LOW (ref 3.5–5.1)
Sodium: 146 mmol/L — ABNORMAL HIGH (ref 135–145)
Total Bilirubin: 1.2 mg/dL (ref 0.3–1.2)
Total Protein: 6 g/dL — ABNORMAL LOW (ref 6.5–8.1)

## 2019-01-15 LAB — HEPARIN LEVEL (UNFRACTIONATED)
Heparin Unfractionated: 0.55 IU/mL (ref 0.30–0.70)
Heparin Unfractionated: 0.6 IU/mL (ref 0.30–0.70)

## 2019-01-15 LAB — APTT
aPTT: 74 seconds — ABNORMAL HIGH (ref 24–36)
aPTT: 78 seconds — ABNORMAL HIGH (ref 24–36)

## 2019-01-15 MED ORDER — INSULIN ASPART 100 UNIT/ML ~~LOC~~ SOLN
2.0000 [IU] | SUBCUTANEOUS | Status: DC
  Administered 2019-01-15 – 2019-01-26 (×66): 2 [IU] via SUBCUTANEOUS

## 2019-01-15 MED ORDER — POTASSIUM CHLORIDE 20 MEQ/15ML (10%) PO SOLN
20.0000 meq | ORAL | Status: AC
  Administered 2019-01-15 (×3): 20 meq via ORAL
  Filled 2019-01-15 (×3): qty 15

## 2019-01-15 MED ORDER — INSULIN ASPART 100 UNIT/ML ~~LOC~~ SOLN
0.0000 [IU] | SUBCUTANEOUS | Status: DC
  Administered 2019-01-15: 4 [IU] via SUBCUTANEOUS
  Administered 2019-01-16: 17:00:00 3 [IU] via SUBCUTANEOUS
  Administered 2019-01-16: 01:00:00 4 [IU] via SUBCUTANEOUS
  Administered 2019-01-16: 12:00:00 3 [IU] via SUBCUTANEOUS
  Administered 2019-01-16: 04:00:00 4 [IU] via SUBCUTANEOUS
  Administered 2019-01-16: 23:00:00 0 [IU] via SUBCUTANEOUS
  Administered 2019-01-16 (×2): 4 [IU] via SUBCUTANEOUS
  Administered 2019-01-17: 18:00:00 7 [IU] via SUBCUTANEOUS
  Administered 2019-01-17 (×2): 4 [IU] via SUBCUTANEOUS
  Administered 2019-01-18: 3 [IU] via SUBCUTANEOUS
  Administered 2019-01-18 – 2019-01-19 (×5): 4 [IU] via SUBCUTANEOUS
  Administered 2019-01-19 (×2): 3 [IU] via SUBCUTANEOUS
  Administered 2019-01-19: 08:00:00 4 [IU] via SUBCUTANEOUS
  Administered 2019-01-20: 20:00:00 3 [IU] via SUBCUTANEOUS
  Administered 2019-01-20: 4 [IU] via SUBCUTANEOUS
  Administered 2019-01-20 (×3): 3 [IU] via SUBCUTANEOUS
  Administered 2019-01-21: 4 [IU] via SUBCUTANEOUS
  Administered 2019-01-21: 09:00:00 7 [IU] via SUBCUTANEOUS
  Administered 2019-01-21 – 2019-01-23 (×10): 3 [IU] via SUBCUTANEOUS
  Administered 2019-01-23 – 2019-01-24 (×4): 4 [IU] via SUBCUTANEOUS
  Administered 2019-01-24: 08:00:00 3 [IU] via SUBCUTANEOUS
  Administered 2019-01-25 (×4): 4 [IU] via SUBCUTANEOUS
  Administered 2019-01-26: 05:00:00 3 [IU] via SUBCUTANEOUS
  Administered 2019-01-26: 12:00:00 4 [IU] via SUBCUTANEOUS

## 2019-01-15 MED ORDER — SODIUM CHLORIDE 0.9 % IV SOLN
3.0000 g | Freq: Three times a day (TID) | INTRAVENOUS | Status: AC
  Administered 2019-01-15 – 2019-01-20 (×15): 3 g via INTRAVENOUS
  Filled 2019-01-15 (×2): qty 3
  Filled 2019-01-15 (×2): qty 8
  Filled 2019-01-15 (×2): qty 3
  Filled 2019-01-15: qty 8
  Filled 2019-01-15: qty 3
  Filled 2019-01-15: qty 8
  Filled 2019-01-15 (×5): qty 3
  Filled 2019-01-15: qty 8

## 2019-01-15 MED ORDER — GADOBUTROL 1 MMOL/ML IV SOLN
7.0000 mL | Freq: Once | INTRAVENOUS | Status: AC | PRN
  Administered 2019-01-15: 22:00:00 7 mL via INTRAVENOUS

## 2019-01-15 NOTE — Progress Notes (Addendum)
NAME:  Daniel Moon, MRN:  [id number], DOB:  [birth year], LOS: 8 ADMISSION DATE:  01/02/2019, CONSULTATION DATE:  12/27 REFERRING MD:  [place] Pink, CHIEF COMPLAINT:  dyspnea   Brief History   58 y/o male with extensive past medical history admitted on `12/21 with COVID pneumonia and CHF exacerbation.  Had a cardiac arrest on 12/27 in the setting of likely aspiration/inability to clear secretions.  History of present illness   58 y/o male with extensive past medical history admitted on `12/21 with COVID pneumonia and CHF exacerbation.  Had a cardiac arrest on 12/27 in the setting of likely aspiration/inability to clear secretions. No further history could be obtained from the patient because of a cardiac arrest with ongoing resuscitation efforts on my arrival.  Briefly here he has been treated for COVID, CHF with diuresis, and alcohol withdrawal.  He has a history of IV heparin use, MRSA bacteremia, COPD, PAD.   Past Medical History  Afib Polysubstance abuse Paroxysmal atrial fib L BKA stump infection with MRSA DM2 COPD  Significant Hospital Events   12/21 admission 12/27 cardiac arrest, intubation  Consults:  PCCM  Procedures:  12/27 ETT >    Significant Diagnostic Tests:  12/21 CT head > NAICP, some atrophy 12/22 CT chest > extensive bilateral airspace disease, mod effusions bilaterally, mostly free flowing (area of loculation right anteriorly?), enlarged chest lymph nodes, pulmonary arterial hypertension, hepatic steatosis 12/22 V/Q scan > low prob for PE  Micro Data:  12/21 blood >   Antimicrobials:  12.21 remdesivir > 12/26 12/22 azithro >  12/22 ceftriaxone >  12/22 tocilizumab   Interim history/subjective:   Patient remains intubated critically ill on mechanical life support in the ICU  Objective   Blood pressure 123/83, pulse 84, temperature 98.9 F (37.2 C), temperature source Axillary, resp. rate (!) 23, height 6\' 3"  (1.905 m), weight 67 kg, SpO2 98 %.   Vent Mode: PSV;CPAP FiO2 (%):  [40 %] 40 % Set Rate:  [20 bmp] 20 bmp Vt Set:  [500 mL] 500 mL PEEP:  [5 [id number] cmH20] 5 cmH20 Pressure Support:  [10 cmH20] 10 cmH20 Plateau Pressure:  [20 cmH20-22 cmH20] 22 cmH20   Intake/Output Summary (Last 24 hours) at 01/15/2019 1700 Last data filed at 01/15/2019 1400 Gross per 24 hour  Intake 1228.1 ml  Output 2550 ml  Net -1321.9 ml   Filed Weights   01/13/19 0434 01/14/19 0500 01/15/19 0447  Weight: 71.3 kg 66.7 kg 67 kg    Examination: General: Intubated, on mechanical life support HENT: NCAT, fixed upward gaze, no response to painful stimuli PULM: Bilateral ventilated breath sounds CV: Regular rate and rhythm, S1-S2 GI: Soft nontender MSK: Left BKA, rigid extremities to passive range of motion Neuro: Fixed upward gaze, no response to painful stimuli off all sedation  Resolved Hospital Problem list     Assessment & Plan:   COVID 19 pneumonia Aspiration pneumonia Right lung atelectasis, mucus again, status post bronchoscopy HCAP Completed course of remdesivir plus Decadron Continue mechanical ventilation per ARDS protocol Target TVol 6-8cc/kgIBW Target Plateau Pressure < 30cm H20 Target driving pressure less than 15 cm of water Target PaO2 55-65: titrate PEEP/FiO2 per protocol Check CVP daily if CVL in place Target CVP less than 4, diurese as necessary Ventilator associated pneumonia prevention protocol 12/29: vent mechanics improving, weaning from support, weaning fio2 Remains off sedation  Complete 5 days unasyn and stop, aspiration event on [id number]   Acute systolic heart failure, now likely cardiogenic shock post  cardiac arrest Cardiac arrest due to hypoxemia from inability to clear airway secretions from ongoing acute encephalopathy Shock state resolved Known history of systolic heart failure Acute on chronic systolic heart failure Prior ejection fraction 20 to 25%. Diuresis to maintain euvolemia  Alcohol  withdrawal Acute toxic metabolic encephalopathy due to alcohol withdrawal prior to cardiac arrest At risk for anoxic brain injury Possible Covid encephalitis? Fixed upward gaze Muscle rigidity? As needed Ativan Continue thiamine folate Head CT negative EEG negative for seizure-like activity MRI pending, scheduled for tonight 8pm  Remains off sedation   Atrial fib Heparin infusion Telemetry   Best practice:  Diet: tube feeding Pain/Anxiety/Delirium protocol (if indicated): yes VAP protocol (if indicated): yes DVT prophylaxis: heparin infusion GI prophylaxis: famotidine Glucose control: SSI Mobility: bed rest Code Status: full Family Communication: I called and updated daughter via phone. Seemingly very poor incite to her fathers baseline health conditions.   Disposition: remain in ICU  Labs   CBC: Recent Labs  Lab 01/12/19 0247 01/13/19 0426 01/13/19 1112 01/13/19 1513 01/14/19 0430 01/15/19 0530  WBC 11.9* 7.6  --  8.4 12.6* 18.9*  NEUTROABS  --   --   --  8.0*  --   --   HGB 12.6* 12.6* 15.3 11.9* 13.3 12.5*  HCT 38.5* 38.9* 45.0 38.3* 41.5 38.5*  MCV 63.7* 64.3*  --  65.7* 64.2* 63.7*  PLT 196 169  --  150 124* 114*    Basic Metabolic Panel: Recent Labs  Lab 01/11/19 1119 01/12/19 0247 01/13/19 0426 01/13/19 1112 01/13/19 1141 01/13/19 1142 01/13/19 1530 01/14/19 0430 01/14/19 1600 01/15/19 0530  NA 142 143 145 150*  --  149*  --  149*  --  146*  K 5.1 4.4 3.8 4.0  --  4.1  --  3.5  --  3.3*  CL 107 105 107  --   --  110  --  107  --  105  CO2 20* 23 27  --   --  26  --  31  --  30  GLUCOSE 83 93 71  --   --  197*  --  168*  --  236*  BUN 62* 61* 52*  --   --  50*  --  52*  --  53*  CREATININE 2.18* 2.23* 2.02*  --   --  2.08*  --  1.86*  --  1.72*  CALCIUM 7.5* 7.8* 7.9*  --   --  7.3*  --  7.7*  --  7.4*  MG 1.9  --   --   --  1.8  --  1.9 1.6* 1.5*  --   PHOS 5.6*  --   --   --  4.1  --  2.4* 1.7* 1.6*  --    GFR: Estimated Creatinine  Clearance: 44.9 mL/min (A) (by C-G formula based on SCr of 1.72 mg/dL (H)). Recent Labs  Lab 01/10/19 1200 01/13/19 0426 01/13/19 1142 01/13/19 1513 01/13/19 1654 01/14/19 0430 01/15/19 0530  WBC  --  7.6  --  8.4  --  12.6* 18.9*  LATICACIDVEN 2.0*  --  5.2*  --  2.6*  --   --     Liver Function Tests: Recent Labs  Lab 01/12/19 0247 01/13/19 0426 01/13/19 1142 01/14/19 0430 01/15/19 0530  AST 35 31 77* 45* 23  ALT 23 21 31 29 23   ALKPHOS 118 109 104 117 110  BILITOT 1.0 1.0 1.6* 0.7 1.2  PROT 6.4* 6.6 6.3*  6.3* 6.0*  ALBUMIN 1.9* 2.1* 2.0* 2.0* 2.1*   No results for input(s): LIPASE, AMYLASE in the last 168 hours. Recent Labs  Lab 01/10/19 1200  AMMONIA 27    ABG    Component Value Date/Time   PHART 7.435 01/13/2019 1112   PCO2ART 44.1 01/13/2019 1112   PO2ART 139.0 (H) 01/13/2019 1112   HCO3 29.2 (H) 01/13/2019 1112   TCO2 30 01/13/2019 1112   ACIDBASEDEF 1.7 01/10/2019 1843   O2SAT 99.0 01/13/2019 1112     Coagulation Profile: No results for input(s): INR, PROTIME in the last 168 hours.  Cardiac Enzymes: No results for input(s): CKTOTAL, CKMB, CKMBINDEX, TROPONINI in the last 168 hours.  HbA1C: Hgb A1c MFr Bld  Date/Time Value Ref Range Status  09/30/2018 12:54 AM 8.4 (H) 4.8 - 5.6 % Final    Comment:    (NOTE)         Prediabetes: 5.7 - 6.4         Diabetes: >6.4         Glycemic control for adults with diabetes: <7.0     CBG: Recent Labs  Lab 01/15/19 0005 01/15/19 0402 01/15/19 0800 01/15/19 1150 01/15/19 1633  GLUCAP 182* 212* 214* 196* 192*   This patient is critically ill with multiple organ system failure; which, requires frequent high complexity decision making, assessment, support, evaluation, and titration of therapies. This was completed through the application of advanced monitoring technologies and extensive interpretation of multiple databases. During this encounter critical care time was devoted to patient care services  described in this note for 32 minutes.  [facility] 01/15/2019 5:00 PM

## 2019-01-15 NOTE — Progress Notes (Signed)
Pt tx to [facility] for transport to [facility] for MRI.  Vent on standby for pt return.

## 2019-01-15 NOTE — Progress Notes (Signed)
Pharmacy Antibiotic Note  Daniel Moon is a 58 y.o. male admitted on 12/30/2018 with COVID-19 pneumonia, s/p cardiac arrest 12/27  in the setting of likely aspiration/inability to clear secretions.  He has been receiving ceftriaxone.  Pharmacy has been consulted for Unasyn dosing.  Plan:  Unasyn 3g IV q8h x 5 days  Monitor renal function and adjust as needed  Height: 6\' 3"  (190.5 cm) Weight: 147 lb 11.3 oz (67 kg) IBW/kg (Calculated) : 84.5  Temp (24hrs), Avg:98.4 F (36.9 C), Min:98 F (36.7 C), Max:98.9 F (37.2 C)  Recent Labs  Lab 01/10/19 1200 01/12/19 0247 01/13/19 0426 01/13/19 1142 01/13/19 1513 01/13/19 1654 01/14/19 0430 01/15/19 0530  WBC  --  11.9* 7.6  --  8.4  --  12.6* 18.9*  CREATININE  --  2.23* 2.02* 2.08*  --   --  1.86* 1.72*  LATICACIDVEN 2.0*  --   --  5.2*  --  2.6*  --   --     Estimated Creatinine Clearance: 44.9 mL/min (A) (by C-G formula based on SCr of 1.72 mg/dL (H)).    Allergies  Allergen Reactions  . Amoxicillin-Pot Clavulanate Nausea And Vomiting    Did it involve swelling of the face/tongue/throat, SOB, or low BP? No Did it involve sudden or severe rash/hives, skin peeling, or any reaction on the inside of your mouth or nose? No Did you need to seek medical attention at a hospital or doctor's office? No When did it last happen?6 months ago If all above answers are "NO", may proceed with cephalosporin use.    Remdesivir 12/22>12/27 Actemra 12/22 Rocephin 12/22> Doxy 12/23> 12/25 Zithromax 12/22>12/23 Unasyn 12/29 >> (1/3)  12/21 Bcx x2: NGF 12/23 MRSA PCR negative 12/27 TA: few yeast  Thank you for allowing pharmacy to be a part of this patient's care.  Peggyann Juba, PharmD, BCPS Pharmacy: [phone] 01/15/2019 5:29 PM

## 2019-01-15 NOTE — Progress Notes (Signed)
Pt arrived to [facility] via [facility] and placed in room WA28 and switched over to [facility] MRI ventilator.  Pt then transported to MRI.  Pt remained stable during trip to MRI and during MRI procedure.  Pt then transported back to WA28 to await [facility] pick up for transport back to [facility].  Pt remained stable throughout the trip back to [facility]. This RT present for entire time.

## 2019-01-15 NOTE — Plan of Care (Signed)
Pt still intubated. FiO2 40% Peep 5. GCS was 3 for most of the shift. Started to respond to painful stimulus late this evening. MRI of the brain is scheduled for 2000 tonight. Daughter was updated and all questions were answered.   Problem: Education: Goal: Knowledge of General Education information will improve Description: Including pain rating scale, medication(s)/side effects and non-pharmacologic comfort measures Outcome: Progressing   Problem: Health Behavior/Discharge Planning: Goal: Ability to manage health-related needs will improve Outcome: Progressing   Problem: Clinical Measurements: Goal: Ability to maintain clinical measurements within normal limits will improve Outcome: Progressing Goal: Will remain free from infection Outcome: Progressing Goal: Diagnostic test results will improve Outcome: Progressing Goal: Respiratory complications will improve Outcome: Progressing Goal: Cardiovascular complication will be avoided Outcome: Progressing   Problem: Nutrition: Goal: Adequate nutrition will be maintained Outcome: Progressing   Problem: Coping: Goal: Level of anxiety will decrease Outcome: Progressing   Problem: Elimination: Goal: Will not experience complications related to bowel motility Outcome: Progressing Goal: Will not experience complications related to urinary retention Outcome: Progressing   Problem: Pain Managment: Goal: General experience of comfort will improve Outcome: Progressing   Problem: Safety: Goal: Ability to remain free from injury will improve Outcome: Progressing   Problem: Skin Integrity: Goal: Risk for impaired skin integrity will decrease Outcome: Progressing

## 2019-01-15 NOTE — Progress Notes (Addendum)
ANTICOAGULATION CONSULT NOTE - Follow Up Consult  Pharmacy Consult for Heparin Indication: H/O artrial thrombus  Allergies  Allergen Reactions  . Amoxicillin-Pot Clavulanate Nausea And Vomiting    Did it involve swelling of the face/tongue/throat, SOB, or low BP? No Did it involve sudden or severe rash/hives, skin peeling, or any reaction on the inside of your mouth or nose? No Did you need to seek medical attention at a hospital or doctor's office? No When did it last happen?6 months ago If all above answers are "NO", may proceed with cephalosporin use.    Patient Measurements: Height: 6\' 3"  (190.5 cm) Weight: 147 lb 11.3 oz (67 kg) IBW/kg (Calculated) : 84.5 Heparin Dosing Weight: TBW  Vital Signs: Temp: 98.5 F (36.9 C) (12/29 0400) Temp Source: Oral (12/29 0400) BP: 133/88 (12/29 0400) Pulse Rate: 82 (12/29 0400)  Labs: Recent Labs    01/13/19 0426 01/13/19 1112 01/13/19 1142 01/13/19 1513 01/13/19 1530 01/14/19 0430 01/14/19 1205 01/14/19 2030  HGB 12.6* 15.3  --  11.9*  --  13.3  --   --   HCT 38.9* 45.0  --  38.3*  --  41.5  --   --   PLT 169  --   --  150  --  124*  --   --   APTT 77*  --   --   --   --  28 42* 52*  HEPARINUNFRC 1.98*  --   --   --   --  0.34 0.39 0.57  CREATININE 2.02*  --  2.08*  --   --  1.86*  --   --   TROPONINIHS  --   --  110*  --  146*  --   --   --     Estimated Creatinine Clearance: 41.5 mL/min (A) (by C-G formula based on SCr of 1.86 mg/dL (H)).   Medications:  Infusions:  . cefTRIAXone (ROCEPHIN)  IV Stopped (01/14/19 1150)  . famotidine (PEPCID) IV Stopped (01/14/19 1803)  . feeding supplement (VITAL AF 1.2 CAL) 1,000 mL (01/15/19 0009)  . fentaNYL infusion INTRAVENOUS Stopped (01/14/19 1340)  . heparin 1,100 Units/hr (01/15/19 0400)  . norepinephrine (LEVOPHED) Adult infusion Stopped (01/13/19 1623)    Assessment: 14 YOM admitted with COVID pneumonia on Xarelto for h/o LV thrombus. Patient is now NPO and pharmacy  consulted to transition him to IV heparin. Last dose of Xarelto was 12/24 pm. Utillizing PTT for heparin monitoring as anti-Xa levels likely affected by Xarelto.  12/28 AM:  Heparin level 0.34, despite heparin off x16 hours.  Heparin was held during code blue event 11/27 1300 and was resumed on 12/28 0530.     aPTT 74 on heparin at 1100 units/hr.  Heparin level 0.55 (elevateion likely d/t Xarelto, AKI) No bleeding or complications reported. CBC:  Hgb stable, Plt further decreased to 114k (baseline Plt 267) SCr decreased to 1.72.  (Baseline SCr 1.2-1.4)  Goal of Therapy:  Heparin level 0.3-0.7 units/ml aPTT 66-102 seconds Monitor platelets by anticoagulation protocol: Yes   Plan:  Continue heparin IV infusion at 1100 units/hr Heparin level in 6 hours to confirm. Daily heparin level and CBC.  Monitor for s/s bleeding. Follow up platelet trend.  Gretta Arab PharmD, BCPS Clinical pharmacist phone 7am- 5pm: [phone] 01/15/2019 7:22 AM    Addendum: aPTT 78, remains therapeutic on heparin at 1100 units/hr.  Plan: Continue heparin IV infusion at 1100 units/hr Daily aPTT and Heparin level, CBC  Gretta Arab PharmD, BCPS Clinical pharmacist  phone 7am- 5pm: [phone] 01/15/2019 2:42 PM

## 2019-01-16 DIAGNOSIS — G931 Anoxic brain damage, not elsewhere classified: Secondary | ICD-10-CM

## 2019-01-16 LAB — CBC
HCT: 39.4 % (ref 39.0–52.0)
Hemoglobin: 12.4 g/dL — ABNORMAL LOW (ref 13.0–17.0)
MCH: 20.3 pg — ABNORMAL LOW (ref 26.0–34.0)
MCHC: 31.5 g/dL (ref 30.0–36.0)
MCV: 64.5 fL — ABNORMAL LOW (ref 80.0–100.0)
Platelets: 114 10*3/uL — ABNORMAL LOW (ref 150–400)
RBC: 6.11 MIL/uL — ABNORMAL HIGH (ref 4.22–5.81)
RDW: 22.3 % — ABNORMAL HIGH (ref 11.5–15.5)
WBC: 17.1 10*3/uL — ABNORMAL HIGH (ref 4.0–10.5)
nRBC: 0.2 % (ref 0.0–0.2)

## 2019-01-16 LAB — COMPREHENSIVE METABOLIC PANEL
ALT: 20 U/L (ref 0–44)
AST: 25 U/L (ref 15–41)
Albumin: 2.2 g/dL — ABNORMAL LOW (ref 3.5–5.0)
Alkaline Phosphatase: 115 U/L (ref 38–126)
Anion gap: 8 (ref 5–15)
BUN: 53 mg/dL — ABNORMAL HIGH (ref 6–20)
CO2: 30 mmol/L (ref 22–32)
Calcium: 7.7 mg/dL — ABNORMAL LOW (ref 8.9–10.3)
Chloride: 109 mmol/L (ref 98–111)
Creatinine, Ser: 1.54 mg/dL — ABNORMAL HIGH (ref 0.61–1.24)
GFR calc Af Amer: 57 mL/min — ABNORMAL LOW (ref >60)
GFR calc non Af Amer: 49 mL/min — ABNORMAL LOW (ref >60)
Glucose, Bld: 172 mg/dL — ABNORMAL HIGH (ref 70–99)
Potassium: 4 mmol/L (ref 3.5–5.1)
Sodium: 147 mmol/L — ABNORMAL HIGH (ref 135–145)
Total Bilirubin: 1.1 mg/dL (ref 0.3–1.2)
Total Protein: 6.7 g/dL (ref 6.5–8.1)

## 2019-01-16 LAB — GLUCOSE, CAPILLARY
Glucose-Capillary: 172 mg/dL — ABNORMAL HIGH (ref 70–99)
Glucose-Capillary: 139 mg/dL — ABNORMAL HIGH (ref 70–99)
Glucose-Capillary: 178 mg/dL — ABNORMAL HIGH (ref 70–99)
Glucose-Capillary: 136 mg/dL — ABNORMAL HIGH (ref 70–99)
Glucose-Capillary: 140 mg/dL — ABNORMAL HIGH (ref 70–99)
Glucose-Capillary: 153 mg/dL — ABNORMAL HIGH (ref 70–99)
Glucose-Capillary: 115 mg/dL — ABNORMAL HIGH (ref 70–99)

## 2019-01-16 LAB — PHOSPHORUS
Phosphorus: 2.1 mg/dL — ABNORMAL LOW (ref 2.5–4.6)
Phosphorus: 2.9 mg/dL (ref 2.5–4.6)

## 2019-01-16 LAB — APTT: aPTT: 59 seconds — ABNORMAL HIGH (ref 24–36)

## 2019-01-16 LAB — MAGNESIUM
Magnesium: 1.9 mg/dL (ref 1.7–2.4)
Magnesium: 2 mg/dL (ref 1.7–2.4)

## 2019-01-16 LAB — HEPARIN LEVEL (UNFRACTIONATED): Heparin Unfractionated: 0.51 IU/mL (ref 0.30–0.70)

## 2019-01-16 MED ORDER — VITAL HIGH PROTEIN PO LIQD: 1000.0000 mL | ORAL | Status: DC

## 2019-01-16 MED ORDER — VITAL AF 1.2 CAL PO LIQD
1000.0000 mL | ORAL | Status: DC
  Administered 2019-01-16 – 2019-01-22 (×7): 1000 mL
  Filled 2019-01-16: qty 1000

## 2019-01-16 MED ORDER — FREE WATER
200.0000 mL | Freq: Four times a day (QID) | Status: DC
  Administered 2019-01-16 – 2019-01-26 (×41): 200 mL

## 2019-01-16 MED ORDER — PRO-STAT SUGAR FREE PO LIQD: 30.0000 mL | Freq: Two times a day (BID) | ORAL | Status: DC

## 2019-01-16 NOTE — Progress Notes (Signed)
Patient transported to [facility] for MRI, this RN accompanied patient. Patient stable throughout transport. Returned to [place] at approximately 2300.

## 2019-01-16 NOTE — Progress Notes (Signed)
NAME:  Daniel Moon, MRN:  [id number], DOB:  07/24/1960, LOS: 9 ADMISSION DATE:  01/16/2019, CONSULTATION DATE:  12/27 REFERRING MD:  [place] Pink, CHIEF COMPLAINT:  dyspnea   Brief History   58 y/o male with extensive past medical history admitted on `12/21 with COVID pneumonia and CHF exacerbation.  Had a cardiac arrest on 12/27 in the setting of likely aspiration/inability to clear secretions.  History of present illness   58 y/o male with extensive past medical history admitted on `12/21 with COVID pneumonia and CHF exacerbation.  Had a cardiac arrest on 12/27 in the setting of likely aspiration/inability to clear secretions. No further history could be obtained from the patient because of a cardiac arrest with ongoing resuscitation efforts on my arrival.  Briefly here he has been treated for COVID, CHF with diuresis, and alcohol withdrawal.  He has a history of IV heparin use, MRSA bacteremia, COPD, PAD.   Past Medical History  Afib Polysubstance abuse Paroxysmal atrial fib L BKA stump infection with MRSA DM2 COPD  Significant Hospital Events   12/21 admission 12/27 cardiac arrest, intubation  Consults:  PCCM  Procedures:  12/27 ETT >    Significant Diagnostic Tests:  12/21 CT head > NAICP, some atrophy 12/22 CT chest > extensive bilateral airspace disease, mod effusions bilaterally, mostly free flowing (area of loculation right anteriorly?), enlarged chest lymph nodes, pulmonary arterial hypertension, hepatic steatosis 12/22 V/Q scan > low prob for PE  Micro Data:  12/21 blood >   Antimicrobials:  12.21 remdesivir > 12/26 12/22 azithro >  12/22 ceftriaxone >  12/22 tocilizumab   Interim history/subjective:   Critically ill intubated on mechanical life support.  Off sedation unresponsive.  [place] brain completed last night.  Objective   Blood pressure 131/90, pulse 85, temperature 98.8 F (37.1 C), temperature source Axillary, resp. rate 18, height 6\' 3"  (1.905  m), weight 65.2 kg, SpO2 100 %.    Vent Mode: PSV;CPAP FiO2 (%):  [40 %-100 %] 40 % Set Rate:  [20 bmp] 20 bmp Vt Set:  [500 mL] 500 mL PEEP:  [5 cmH20-8 cmH20] 5 cmH20 Pressure Support:  [10 cmH20] 10 cmH20 Plateau Pressure:  [17 cmH20-23 cmH20] 17 cmH20   Intake/Output Summary (Last 24 hours) at 01/16/2019 0924 Last data filed at 01/16/2019 0800 Gross per 24 hour  Intake 1641.21 ml  Output 1275 ml  Net 366.21 ml   Filed Weights   01/14/19 0500 01/15/19 0447 01/16/19 0500  Weight: 66.7 kg 67 kg 65.2 kg    Examination: General: Cachectic, elderly male, intubated on mechanical life support HENT: NCAT, fixed upward gaze, PULM: Bilateral ventilated breath sounds CV: Regular rate and rhythm, S1-S2 GI: Soft, nontender MSK: Left BKA, extremities are much less rigid today with passive range of motion Neuro: Fixed upward gaze no withdrawal to pain.  Patient does have some internal left arm rotation with occipital pressure.  Resolved Hospital Problem list     Assessment & Plan:   COVID 19 pneumonia Aspiration pneumonia Right lung atelectasis, mucus again, status post bronchoscopy HCAP Complete course remdesivir plus Decadron. Full vent protocol Low tidal volume ventilation. Able to tolerate SBT today with minimal support. Mental status precludes liberation from ventilator. Antibiotics de-escalate to Unasyn  Acute systolic heart failure, now likely cardiogenic shock post cardiac arrest Cardiac arrest due to hypoxemia from inability to clear airway secretions from ongoing acute encephalopathy Shock state resolved Known history of systolic heart failure Acute on chronic systolic heart failure Prior ejection  fraction 20 to 25%. Continue diuresis as needed to maintain euvolemia  Alcohol withdrawal Acute toxic metabolic encephalopathy due to alcohol withdrawal prior to cardiac arrest At risk for anoxic brain injury Possible Covid encephalitis? Fixed upward gaze Muscle  rigidity? 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 We will continue to update family on likely poor prognosis Patient has been off of sedation for greater than 48 hours with no significant recovery  Atrial fib Heparin infusion  Rate controlled on telemetry  AKI, acute renal failure Mild hypernatremia Improving Free water replacement increased  Nutrition Tube feeds  Best practice:  Diet: tube feeding Pain/Anxiety/Delirium protocol (if indicated): yes VAP protocol (if indicated): yes DVT prophylaxis: heparin infusion GI prophylaxis: famotidine Glucose control: SSI Mobility: bed rest Code Status: full Family Communication: I have called and attempted to reach out to daughter again no answer on the phone. Disposition: remain in ICU  Labs   CBC: Recent Labs  Lab 01/13/19 0426 01/13/19 1112 01/13/19 1513 01/14/19 0430 01/15/19 0530 01/16/19 0500  WBC 7.6  --  8.4 12.6* 18.9* 17.1*  NEUTROABS  --   --  8.0*  --   --   --   HGB 12.6* 15.3 11.9* 13.3 12.5* 12.4*  HCT 38.9* 45.0 38.3* 41.5 38.5* 39.4  MCV 64.3*  --  65.7* 64.2* 63.7* 64.5*  PLT 169  --  150 124* 114* 114*    Basic Metabolic Panel: Recent Labs  Lab 01/13/19 0426 01/13/19 1112 01/13/19 1141 01/13/19 1142 01/13/19 1530 01/14/19 0430 01/14/19 1600 01/15/19 0530 01/16/19 0500  NA 145 150*  --  149*  --  149*  --  146* 147*  K 3.8 4.0  --  4.1  --  3.5  --  3.3* 4.0  CL 107  --   --  110  --  107  --  105 109  CO2 27  --   --  26  --  31  --  30 30  GLUCOSE 71  --   --  197*  --  168*  --  236* 172*  BUN 52*  --   --  50*  --  52*  --  53* 53*  CREATININE 2.02*  --   --  2.08*  --  1.86*  --  1.72* 1.54*  CALCIUM 7.9*  --   --  7.3*  --  7.7*  --  7.4* 7.7*  MG  --   --  1.8  --  1.9 1.6* 1.5*  --  1.9  PHOS  --   --  4.1  --  2.4* 1.7* 1.6*  --  2.1*   GFR: Estimated Creatinine Clearance: 48.8 mL/min (A) (by C-G formula based on SCr of 1.54  mg/dL (H)). Recent Labs  Lab 01/10/19 1200 01/13/19 1142 01/13/19 1513 01/13/19 1654 01/14/19 0430 01/15/19 0530 01/16/19 0500  WBC  --   --  8.4  --  12.6* 18.9* 17.1*  LATICACIDVEN 2.0* 5.2*  --  2.6*  --   --   --     Liver Function Tests: Recent Labs  Lab 01/13/19 0426 01/13/19 1142 01/14/19 0430 01/15/19 0530 01/16/19 0500  AST 31 77* 45* 23 25  ALT 21 31 29 23 20   ALKPHOS 109 104 117 110 115  BILITOT 1.0 1.6* 0.7 1.2 1.1  PROT 6.6 6.3* 6.3* 6.0* 6.7  ALBUMIN 2.1* 2.0* 2.0* 2.1* 2.2*   No results for input(s): LIPASE, AMYLASE in  the last 168 hours. Recent Labs  Lab 01/10/19 1200  AMMONIA 27    ABG    Component Value Date/Time   PHART 7.435 01/13/2019 1112   PCO2ART 44.1 01/13/2019 1112   PO2ART 139.0 (H) 01/13/2019 1112   HCO3 29.2 (H) 01/13/2019 1112   TCO2 30 01/13/2019 1112   ACIDBASEDEF 1.7 01/10/2019 1843   O2SAT 99.0 01/13/2019 1112     Coagulation Profile: No results for input(s): INR, PROTIME in the last 168 hours.  Cardiac Enzymes: No results for input(s): CKTOTAL, CKMB, CKMBINDEX, TROPONINI in the last 168 hours.  HbA1C: Hgb A1c MFr Bld  Date/Time Value Ref Range Status  09/30/2018 12:54 AM 8.4 (H) 4.8 - 5.6 % Final    Comment:    (NOTE)         Prediabetes: 5.7 - 6.4         Diabetes: >6.4         Glycemic control for adults with diabetes: <7.0     CBG: Recent Labs  Lab 01/15/19 1150 01/15/19 1633 01/15/19 1938 01/16/19 0408 01/16/19 0743  GLUCAP 196* 192* 120* 172* 139*    This patient is critically ill with multiple organ system failure; which, requires frequent high complexity decision making, assessment, support, evaluation, and titration of therapies. This was completed through the application of advanced monitoring technologies and extensive interpretation of multiple databases. During this encounter critical care time was devoted to patient care services described in this note for 34 minutes.  Garner Nash,  DO [facility] 01/16/2019 9:24 AM

## 2019-01-16 NOTE — Progress Notes (Signed)
ANTICOAGULATION CONSULT NOTE - Follow Up Consult  Pharmacy Consult for Heparin Indication: H/O artrial thrombus  Allergies  Allergen Reactions  . Amoxicillin-Pot Clavulanate Nausea And Vomiting    Did it involve swelling of the face/tongue/throat, SOB, or low BP? No Did it involve sudden or severe rash/hives, skin peeling, or any reaction on the inside of your mouth or nose? No Did you need to seek medical attention at a hospital or doctor's office? No When did it last happen?6 months ago If all above answers are "NO", may proceed with cephalosporin use.    Patient Measurements: Height: 6\' 3"  (190.5 cm) Weight: 143 lb 11.8 oz (65.2 kg) IBW/kg (Calculated) : 84.5 Heparin Dosing Weight: TBW  Vital Signs: Temp: 99.1 F (37.3 C) (12/30 0400) Temp Source: Oral (12/30 0400) BP: 134/90 (12/30 0700) Pulse Rate: 86 (12/30 0700)  Labs: Recent Labs    01/13/19 1142 01/13/19 1513 01/13/19 1530 01/14/19 0430 01/15/19 0530 01/15/19 0616 01/15/19 1358 01/16/19 0500  HGB  --   --   --  13.3 12.5*  --   --  12.4*  HCT  --   --   --  41.5 38.5*  --   --  39.4  PLT  --   --   --  124* 114*  --   --  114*  APTT  --    < >  --  28  --  74* 78* 59*  HEPARINUNFRC  --    < >  --  0.34  --  0.55 0.60 0.51  CREATININE 2.08*  --   --  1.86* 1.72*  --   --  1.54*  TROPONINIHS 110*  --  146*  --   --   --   --   --    < > = values in this interval not displayed.    Estimated Creatinine Clearance: 48.8 mL/min (A) (by C-G formula based on SCr of 1.54 mg/dL (H)).   Medications: Infusions:  . ampicillin-sulbactam (UNASYN) IV Stopped (01/16/19 0231)  . famotidine (PEPCID) IV Stopped (01/15/19 1708)  . feeding supplement (VITAL AF 1.2 CAL) 1,000 mL (01/15/19 0009)  . fentaNYL infusion INTRAVENOUS 50 mcg/hr (01/16/19 0700)  . heparin 1,100 Units/hr (01/16/19 0700)  . norepinephrine (LEVOPHED) Adult infusion Stopped (01/13/19 1623)    Assessment: 20 YOM admitted with COVID pneumonia on  Xarelto for h/o LV thrombus. Patient is now NPO and pharmacy consulted to transition him to IV heparin. Last dose of Xarelto was 12/24 pm.  Heparin was held during code blue event 11/27 1300 and was resumed on 12/28 0530.     aPTT 59, slightly subtherapeutic on heparin at 1100 units/hr.  Heparin level 0.51, decreased and correlating with aPTT.  Will transition to using HL instead of aPTT for heparin monitoring. No bleeding or complications reported. CBC:  Hgb stable, Plt remains low at 114k (baseline Plt 267) SCr decreased to 1.5.  (Baseline SCr 1.2-1.4)  Goal of Therapy:  Heparin level 0.3-0.7 units/ml aPTT 66-102 seconds Monitor platelets by anticoagulation protocol: Yes   Plan:  Continue heparin IV infusion at 1100 units/hr Daily heparin level and CBC.  Monitor for s/s bleeding. Follow up platelet trend.  Gretta Arab PharmD, BCPS Clinical pharmacist phone 7am- 5pm: [phone] 01/16/2019 7:14 AM

## 2019-01-16 NOTE — Progress Notes (Signed)
Nutrition Follow-up / Consult RD working remotely.  DOCUMENTATION CODES:   Not applicable  INTERVENTION:    Increase Vital AF 1.2 to 65 ml/h (1560 ml per day).   Provides 1872 kcal, 117 gm protein, 1265 ml free water daily.   Continue 200 ml free water flushes every 6 hours.    If plans to prone patient, recommend change TF to Vital 1.5 at 40 ml/h with Pro-stat 30 ml QID to provide 1840 kcal, 125 gm protein, 1192 ml free water daily.   NUTRITION DIAGNOSIS:   Increased nutrient needs related to acute illness(COVID-19) as evidenced by estimated needs.  Ongoing   GOAL:   Patient will meet greater than or equal to 90% of their needs  Met with TF  MONITOR:   Vent status, Labs, Weight trends, TF tolerance, Skin, I & O's  REASON FOR ASSESSMENT:   Consult Enteral/tube feeding initiation and management  ASSESSMENT:   58 year old male who presented to the ED on 12/21 with weakness and SOB. Pt also COVID-19 positive. 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  Received MD Consult for TF initiation and management. OG tube in place; receiving Vital AF 1.2 at 60 ml/h providing 1728 kcal, 108 gm protein, 1168 ml free water daily. Free water flushes 200 ml every 6 hours.   Patient is currently intubated on ventilator support MV: 9 L/min Temp (24hrs), Avg:99 F (37.2 C), Min:98.2 F (36.8 C), Max:100 F (37.8 C)   Labs reviewed. Sodium 147 (H), phos 2.1 (L) CBG's: [phone]  Medications reviewed and include novolog, MVI, thiamine.  Patient is currently off sedation with no response to painful stimuli.  Diuresis ongoing.   Diet Order:   Diet Order            Diet NPO time specified  Diet effective now              EDUCATION NEEDS:   No education needs have been identified at this time  Skin:  Skin Assessment: Skin Integrity Issues: Skin Integrity Issues:: Other  (Comment) Other: s/p left BKA  Last BM:  12/27  Height:   Ht Readings from Last 1 Encounters:  01/10/19 '6\' 3"'  (1.905 m)    Weight:   Wt Readings from Last 1 Encounters:  01/16/19 65.2 kg    Ideal Body Weight:  83.3 kg(adjusted for left BKA)  BMI:  19.2 (adjusted for BKA)  Estimated Nutritional Needs:   Kcal:  1700-2000  Protein:  100-115 grams  Fluid:  > 1.8 L    Molli Barrows, RD, LDN, CNSC Pager [phone] After Hours Pager [phone]

## 2019-01-16 NOTE — Progress Notes (Signed)
PCCM:  MRI findings discussed with neurology.  Bilateral thalamic injury consistent with anoxia.  This is potentially recoverable however will take a significant amount of time.  Before making any judgments on recovery would give him at least 1 week post arrest for recommendations of Dr. Clovis Riley, DO [facility] 01/16/2019 4:12 PM

## 2019-01-16 NOTE — Plan of Care (Signed)

## 2019-01-16 NOTE — Plan of Care (Signed)
Pt remained on the vent this morning/ FiO2 40% PEEP 5 on pressure support. Pt became tachycardic and hypertensive when fentanyl drip was turned off for 30 mins. Fentanyl drip was restarted. Pt shows abnormal extension to strong painful stimuli. Family was updated and questions were answered.  Problem: Education: Goal: Knowledge of General Education information will improve Description: Including pain rating scale, medication(s)/side effects and non-pharmacologic comfort measures Outcome: Progressing   Problem: Clinical Measurements: Goal: Ability to maintain clinical measurements within normal limits will improve Outcome: Progressing Goal: Will remain free from infection Outcome: Progressing Goal: Diagnostic test results will improve Outcome: Progressing Goal: Respiratory complications will improve Outcome: Progressing Goal: Cardiovascular complication will be avoided Outcome: Progressing   Problem: Activity: Goal: Risk for activity intolerance will decrease Outcome: Progressing   Problem: Nutrition: Goal: Adequate nutrition will be maintained Outcome: Progressing   Problem: Pain Managment: Goal: General experience of comfort will improve Outcome: Progressing   Problem: Safety: Goal: Ability to remain free from injury will improve Outcome: Progressing

## 2019-01-17 DIAGNOSIS — G931 Anoxic brain damage, not elsewhere classified: Secondary | ICD-10-CM

## 2019-01-17 LAB — GLUCOSE, CAPILLARY
Glucose-Capillary: 193 mg/dL — ABNORMAL HIGH (ref 70–99)
Glucose-Capillary: 151 mg/dL — ABNORMAL HIGH (ref 70–99)
Glucose-Capillary: 72 mg/dL (ref 70–99)
Glucose-Capillary: 241 mg/dL — ABNORMAL HIGH (ref 70–99)
Glucose-Capillary: 98 mg/dL (ref 70–99)
Glucose-Capillary: 98 mg/dL (ref 70–99)

## 2019-01-17 LAB — COMPREHENSIVE METABOLIC PANEL
ALT: 17 U/L (ref 0–44)
AST: 32 U/L (ref 15–41)
Albumin: 2.1 g/dL — ABNORMAL LOW (ref 3.5–5.0)
Alkaline Phosphatase: 105 U/L (ref 38–126)
Anion gap: 8 (ref 5–15)
BUN: 51 mg/dL — ABNORMAL HIGH (ref 6–20)
CO2: 30 mmol/L (ref 22–32)
Calcium: 7.8 mg/dL — ABNORMAL LOW (ref 8.9–10.3)
Chloride: 106 mmol/L (ref 98–111)
Creatinine, Ser: 1.51 mg/dL — ABNORMAL HIGH (ref 0.61–1.24)
GFR calc Af Amer: 59 mL/min — ABNORMAL LOW (ref >60)
GFR calc non Af Amer: 51 mL/min — ABNORMAL LOW (ref >60)
Glucose, Bld: 211 mg/dL — ABNORMAL HIGH (ref 70–99)
Potassium: 5 mmol/L (ref 3.5–5.1)
Sodium: 144 mmol/L (ref 135–145)
Total Bilirubin: 0.7 mg/dL (ref 0.3–1.2)
Total Protein: 5.9 g/dL — ABNORMAL LOW (ref 6.5–8.1)

## 2019-01-17 LAB — HEPARIN LEVEL (UNFRACTIONATED): Heparin Unfractionated: 0.49 IU/mL (ref 0.30–0.70)

## 2019-01-17 LAB — CBC
HCT: 36.4 % — ABNORMAL LOW (ref 39.0–52.0)
Hemoglobin: 11.5 g/dL — ABNORMAL LOW (ref 13.0–17.0)
MCH: 20.5 pg — ABNORMAL LOW (ref 26.0–34.0)
MCHC: 31.6 g/dL (ref 30.0–36.0)
MCV: 65 fL — ABNORMAL LOW (ref 80.0–100.0)
Platelets: 109 10*3/uL — ABNORMAL LOW (ref 150–400)
RBC: 5.6 MIL/uL (ref 4.22–5.81)
RDW: 22.2 % — ABNORMAL HIGH (ref 11.5–15.5)
WBC: 11.9 10*3/uL — ABNORMAL HIGH (ref 4.0–10.5)
nRBC: 0.2 % (ref 0.0–0.2)

## 2019-01-17 LAB — CULTURE, RESPIRATORY W GRAM STAIN: Special Requests: NORMAL

## 2019-01-17 LAB — MAGNESIUM: Magnesium: 1.8 mg/dL (ref 1.7–2.4)

## 2019-01-17 LAB — APTT: aPTT: 50 seconds — ABNORMAL HIGH (ref 24–36)

## 2019-01-17 LAB — PHOSPHORUS: Phosphorus: 2.2 mg/dL — ABNORMAL LOW (ref 2.5–4.6)

## 2019-01-17 NOTE — Progress Notes (Signed)
Spoke with pt daughters on 3 way. Had a detailed conversation about pt condition. Answered all questions. Advised we would call will changes.

## 2019-01-17 NOTE — Progress Notes (Signed)
NAME:  Daniel Moon, MRN:  [id number], DOB:  1960-03-01, LOS: 35 ADMISSION DATE:  12/27/2018, CONSULTATION DATE:  12/27 REFERRING MD:  [place] Pink, CHIEF COMPLAINT:  dyspnea   Brief History   58 y/o male with extensive past medical history admitted on 12/21 with COVID pneumonia and CHF exacerbation.  Had a cardiac arrest on 12/27 in the setting of likely aspiration/inability to clear secretions.  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  Bilateral thalamic injury consistent with anoxia.  Heparin stopped as patient bit tongue and was bleeding excessively Micro Data:  12/21 blood >   Antimicrobials:  12.21 remdesivir > 12/26 12/22 azithro >  12/22 ceftriaxone >  12/22 tocilizumab   Interim history/subjective:  No significant changes.  Coughs, but otherwise no response.  Tongue is bleeding from biting earlier, heparin stopped  Objective   Blood pressure (Abnormal) 134/92, pulse 95, temperature 98.9 F (37.2 C), temperature source Axillary, resp. rate (Abnormal) 28, height 6\' 3"  (1.905 m), weight 67.4 kg, SpO2 100 %.    Vent Mode: PRVC FiO2 (%):  [30 %-40 %] 40 % Set Rate:  [20 bmp] 20 bmp Vt Set:  [500 mL] 500 mL PEEP:  [5 cmH20] 5 cmH20 Pressure Support:  [5 cmH20-8 cmH20] 8 cmH20 Plateau Pressure:  [17 cmH20-20 cmH20] 18 cmH20   Intake/Output Summary (Last 24 hours) at 01/17/2019 0932 Last data filed at  01/17/2019 [id number] Gross per 24 hour  Intake 858.54 ml  Output 2575 ml  Net -1716.46 ml   Filed Weights   01/15/19 0447 01/16/19 0500 01/17/19 0434  Weight: 67 kg 65.2 kg 67.4 kg    Examination: General: 58 year old male patient remains comatose HEENT's copious oral bleeding from biting tongue currently dressed.  Orally intubated Pulmonary: Coarse scattered rhonchi FiO2:30 PEEP 5 PS 5  vts 300s rr 20s Cardiac regular rate and rhythm Abdomen soft nontender Extremities warm and dry GU clear yellow Neuro GCS 3-4 does have a cough  Resolved Hospital Problem list   Cardiac arrest secondary to hypoxemia and ineffective airway clearance  Assessment & Plan:   COVID 19 pneumonia located by aspiration pneumonia Right lung atelectasis, mucus again, status post bronchoscopy HCAP He remains ventilator dependent due to mental status Plan Continuing full ventilator support Complete 5 days of remdesivir and 10 days of Decadron Unasyn, now day #3 of 5  Acute  on chronic systolic heart failure, now likely cardiogenic shock post cardiac arrest Prior ejection fraction 20 to 25%. Plan Continuing diuretics Continue telemetry monitoring  Acute toxic and metabolic encephalopathy secondary to what appears to be anoxic brain injury post cardiac arrest further complicated by alcohol withdrawal, and metabolic derangements  Plan Continue as needed Ativan Continue thiamine and folate Supportive care  Atrial fib Plan Amatory monitoring.  Heparin stopped due to bleeding from tongue  AKI with hyponatremia Creatinine improved, sodium improving. Plan Continuing supportive care IV diuresis as tolerated Continue free water replacement  Hyperglycemia  Plan  Sliding scale insulin  nutrition Plan tubefeeds   Best practice:  Diet: tube feeding Pain/Anxiety/Delirium protocol (if indicated): yes VAP protocol (if indicated): yes DVT prophylaxis: heparin infusion-->changed to SCDs GI  prophylaxis: famotidine Glucose control: SSI Mobility: bed rest Code Status: full Family Communication: I have called and attempted to reach out to daughter again no answer on the phone. Disposition: remain in ICU  My cct 32 minutes.   Erick Colace ACNP-BC [place] Pager # [phone] OR # [phone] if no answer

## 2019-01-17 NOTE — Plan of Care (Signed)
  Problem: Health Behavior/Discharge Planning: Goal: Ability to manage health-related needs will improve Outcome: Progressing   Problem: Education: Goal: Knowledge of General Education information will improve Description: Including pain rating scale, medication(s)/side effects and non-pharmacologic comfort measures Outcome: Progressing   Problem: Clinical Measurements: Goal: Ability to maintain clinical measurements within normal limits will improve Outcome: Progressing Goal: Will remain free from infection Outcome: Progressing Goal: Diagnostic test results will improve Outcome: Progressing Goal: Respiratory complications will improve Outcome: Progressing Goal: Cardiovascular complication will be avoided Outcome: Progressing   Problem: Activity: Goal: Risk for activity intolerance will decrease Outcome: Progressing   Problem: Nutrition: Goal: Adequate nutrition will be maintained Outcome: Progressing   Problem: Coping: Goal: Level of anxiety will decrease Outcome: Progressing   Problem: Elimination: Goal: Will not experience complications related to bowel motility Outcome: Progressing Goal: Will not experience complications related to urinary retention Outcome: Progressing   Problem: Pain Managment: Goal: General experience of comfort will improve Outcome: Progressing   Problem: Safety: Goal: Ability to remain free from injury will improve Outcome: Progressing   Problem: Skin Integrity: Goal: Risk for impaired skin integrity will decrease Outcome: Progressing   

## 2019-01-17 NOTE — Progress Notes (Signed)
Daughter updated via phone. Family discussing goals of care   Erick Colace ACNP-BC [place] Pager # [phone] OR # [phone] if no answer

## 2019-01-17 NOTE — Progress Notes (Signed)
ANTICOAGULATION CONSULT NOTE - Follow Up Consult  Pharmacy Consult for Heparin Indication: H/O artrial thrombus  Allergies  Allergen Reactions  . Amoxicillin-Pot Clavulanate Nausea And Vomiting    Did it involve swelling of the face/tongue/throat, SOB, or low BP? No Did it involve sudden or severe rash/hives, skin peeling, or any reaction on the inside of your mouth or nose? No Did you need to seek medical attention at a hospital or doctor's office? No When did it last happen?6 months ago If all above answers are "NO", may proceed with cephalosporin use.    Patient Measurements: Height: 6\' 3"  (190.5 cm) Weight: 148 lb 9.4 oz (67.4 kg) IBW/kg (Calculated) : 84.5 Heparin Dosing Weight: TBW  Vital Signs: Temp: 98.7 F (37.1 C) (12/31 0415) Temp Source: Axillary (12/31 0415) BP: 122/80 (12/31 0400) Pulse Rate: 88 (12/31 0645)  Labs: Recent Labs    01/15/19 0530 01/15/19 1358 01/16/19 0500 01/17/19 0430  HGB 12.5*  --  12.4* 11.5*  HCT 38.5*  --  39.4 36.4*  PLT 114*  --  114* 109*  APTT  --  78* 59* 50*  HEPARINUNFRC  --  0.60 0.51 0.49  CREATININE 1.72*  --  1.54* 1.51*    Estimated Creatinine Clearance: 51.5 mL/min (A) (by C-G formula based on SCr of 1.51 mg/dL (H)).   Medications: Infusions:  . ampicillin-sulbactam (UNASYN) IV Stopped (01/17/19 0136)  . famotidine (PEPCID) IV Stopped (01/16/19 1819)  . feeding supplement (VITAL AF 1.2 CAL) 1,000 mL (01/16/19 1518)  . fentaNYL infusion INTRAVENOUS 50 mcg/hr (01/17/19 0505)  . heparin 1,100 Units/hr (01/17/19 0600)    Assessment: 35 YOM admitted with COVID pneumonia on Xarelto for h/o LV thrombus. Patient is now NPO and pharmacy consulted to transition him to IV heparin. Last dose of Xarelto was 12/24 pm.  Heparin was held during code blue event 11/27 1300 and was resumed on 12/28 0530.     Heparin level 0.49, remains therapeutic on heparin at 1100 units/hr.  No bleeding or complications reported. CBC:  Hgb  low/stable, Plt remains low and decreased to 109k (baseline Plt 267) SCr decreased to 1.5.  (Baseline SCr 1.2-1.4)  Goal of Therapy:  Heparin level 0.3-0.7 units/ml aPTT 66-102 seconds Monitor platelets by anticoagulation protocol: Yes   Plan:  Continue heparin IV infusion at 1100 units/hr Daily heparin level and CBC.  Monitor for s/s bleeding. Follow up platelet trend.  Gretta Arab PharmD, BCPS Clinical pharmacist phone 7am- 5pm: [phone] 01/17/2019 7:51 AM

## 2019-01-18 LAB — APTT: aPTT: 28 seconds (ref 24–36)

## 2019-01-18 LAB — CBC
HCT: 37.4 % — ABNORMAL LOW (ref 39.0–52.0)
Hemoglobin: 11.6 g/dL — ABNORMAL LOW (ref 13.0–17.0)
MCH: 20.5 pg — ABNORMAL LOW (ref 26.0–34.0)
MCHC: 31 g/dL (ref 30.0–36.0)
MCV: 66 fL — ABNORMAL LOW (ref 80.0–100.0)
Platelets: 93 10*3/uL — ABNORMAL LOW (ref 150–400)
RBC: 5.67 MIL/uL (ref 4.22–5.81)
RDW: 22.5 % — ABNORMAL HIGH (ref 11.5–15.5)
WBC: 10.6 10*3/uL — ABNORMAL HIGH (ref 4.0–10.5)
nRBC: 0.2 % (ref 0.0–0.2)

## 2019-01-18 LAB — COMPREHENSIVE METABOLIC PANEL
ALT: 17 U/L (ref 0–44)
AST: 44 U/L — ABNORMAL HIGH (ref 15–41)
Albumin: 2.3 g/dL — ABNORMAL LOW (ref 3.5–5.0)
Alkaline Phosphatase: 92 U/L (ref 38–126)
Anion gap: 10 (ref 5–15)
BUN: 53 mg/dL — ABNORMAL HIGH (ref 6–20)
CO2: 25 mmol/L (ref 22–32)
Calcium: 7.9 mg/dL — ABNORMAL LOW (ref 8.9–10.3)
Chloride: 109 mmol/L (ref 98–111)
Creatinine, Ser: 1.52 mg/dL — ABNORMAL HIGH (ref 0.61–1.24)
GFR calc Af Amer: 58 mL/min — ABNORMAL LOW (ref >60)
GFR calc non Af Amer: 50 mL/min — ABNORMAL LOW (ref >60)
Glucose, Bld: 130 mg/dL — ABNORMAL HIGH (ref 70–99)
Potassium: 6.2 mmol/L — ABNORMAL HIGH (ref 3.5–5.1)
Sodium: 144 mmol/L (ref 135–145)
Total Bilirubin: 1 mg/dL (ref 0.3–1.2)
Total Protein: 6.1 g/dL — ABNORMAL LOW (ref 6.5–8.1)

## 2019-01-18 LAB — GLUCOSE, CAPILLARY
Glucose-Capillary: 154 mg/dL — ABNORMAL HIGH (ref 70–99)
Glucose-Capillary: 111 mg/dL — ABNORMAL HIGH (ref 70–99)
Glucose-Capillary: 177 mg/dL — ABNORMAL HIGH (ref 70–99)
Glucose-Capillary: 158 mg/dL — ABNORMAL HIGH (ref 70–99)
Glucose-Capillary: 155 mg/dL — ABNORMAL HIGH (ref 70–99)
Glucose-Capillary: 150 mg/dL — ABNORMAL HIGH (ref 70–99)

## 2019-01-18 LAB — BASIC METABOLIC PANEL
Anion gap: 8 (ref 5–15)
BUN: 53 mg/dL — ABNORMAL HIGH (ref 6–20)
CO2: 26 mmol/L (ref 22–32)
Calcium: 8 mg/dL — ABNORMAL LOW (ref 8.9–10.3)
Chloride: 108 mmol/L (ref 98–111)
Creatinine, Ser: 1.46 mg/dL — ABNORMAL HIGH (ref 0.61–1.24)
GFR calc Af Amer: >60 mL/min (ref >60)
GFR calc non Af Amer: 53 mL/min — ABNORMAL LOW (ref >60)
Glucose, Bld: 152 mg/dL — ABNORMAL HIGH (ref 70–99)
Potassium: 4.7 mmol/L (ref 3.5–5.1)
Sodium: 142 mmol/L (ref 135–145)

## 2019-01-18 LAB — HEPARIN LEVEL (UNFRACTIONATED): Heparin Unfractionated: 0.14 IU/mL — ABNORMAL LOW (ref 0.30–0.70)

## 2019-01-18 MED ORDER — SODIUM ZIRCONIUM CYCLOSILICATE 10 G PO PACK
10.0000 g | PACK | Freq: Once | ORAL | Status: AC
  Administered 2019-01-18: 10:00:00 10 g via ORAL
  Filled 2019-01-18: qty 1

## 2019-01-18 NOTE — Progress Notes (Signed)
Pharmacy Antibiotic Note  Daniel Moon is a 59 y.o. male admitted on 12/27/2018 with COVID-19 pneumonia, s/p cardiac arrest 12/27  in the setting of likely aspiration/inability to clear secretions.  He has been receiving ceftriaxone.  Pharmacy has been consulted for Unasyn dosing.  Plan:  Unasyn 3g IV q8h x 5 days  Monitor renal function and adjust as needed  Pharmacy will sign off notes, continue to follow peripherally as needed for dosing changes.  Height: 6\' 3"  (190.5 cm) Weight: 140 lb 14 oz (63.9 kg) IBW/kg (Calculated) : 84.5  Temp (24hrs), Avg:99 F (37.2 C), Min:98.7 F (37.1 C), Max:99.3 F (37.4 C)  Recent Labs  Lab 01/13/19 1142 01/13/19 1654 01/14/19 0430 01/15/19 0530 01/16/19 0500 01/17/19 0430 01/18/19 0410  WBC  --   --  12.6* 18.9* 17.1* 11.9* 10.6*  CREATININE 2.08*  --  1.86* 1.72* 1.54* 1.51* 1.52*  LATICACIDVEN 5.2* 2.6*  --   --   --   --   --     Estimated Creatinine Clearance: 48.5 mL/min (A) (by C-G formula based on SCr of 1.52 mg/dL (H)).    Allergies  Allergen Reactions  . Amoxicillin-Pot Clavulanate Nausea And Vomiting    Did it involve swelling of the face/tongue/throat, SOB, or low BP? No Did it involve sudden or severe rash/hives, skin peeling, or any reaction on the inside of your mouth or nose? No Did you need to seek medical attention at a hospital or doctor's office? No When did it last happen?6 months ago If all above answers are "NO", may proceed with cephalosporin use.    Remdesivir 12/22>12/27 Actemra 12/22 Rocephin 12/22> Doxy 12/23> 12/25 Zithromax 12/22>12/23 Unasyn 12/29 >> (1/3)  12/21 Bcx x2: NGF 12/23 MRSA PCR negative 12/27 TA: few yeast  Thank you for allowing pharmacy to be a part of this patient's care.  chronic

## 2019-01-18 NOTE — Progress Notes (Signed)
NAME:  Daniel Moon, MRN:  [id number], DOB:  1960/03/25, LOS: 38 ADMISSION DATE:  12/28/2018, CONSULTATION DATE:  12/27 REFERRING MD:  Daniel Moon, CHIEF COMPLAINT:  dyspnea   Brief History   59 y/o male with extensive past medical history admitted on 12/21 with COVID pneumonia and CHF exacerbation.  Had a cardiac arrest on 12/27 in the setting of likely aspiration/inability to clear secretions.  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  Bilateral thalamic injury consistent with anoxia.  Heparin stopped as patient bit tongue and was bleeding excessively Micro Data:  12/21 blood >   Antimicrobials:  12.21 remdesivir > 12/26 12/22 azithro > 12/29 12/22 ceftriaxone > 12/29 12/22 tocilizumab Unasyn 12/29 >>   Interim history/subjective:  No significant neurological change reported Heparin infusion stopped on 12/31 due to bleeding from tongue laceration  Objective   Blood pressure 115/79, pulse 89, temperature 99.3 F (37.4 C), temperature source Oral, resp. rate (!) 25, height 6\' 3"  (1.905 m), weight 63.9 kg, SpO2 100 %.    Vent Mode: PRVC FiO2 (%):  [30 %] 30 % Set Rate:  [20 bmp] 20 bmp Vt Set:  [500 mL] 500 mL PEEP:  [5 cmH20] 5 cmH20 Pressure Support:  [8 cmH20] 8 cmH20 Plateau Pressure:  [28 cmH20] 28 cmH20   Intake/Output Summary (Last 24 hours) at 01/18/2019 0819 Last data filed at 01/18/2019 0600 Gross  per 24 hour  Intake 741.64 ml  Output 960 ml  Net -218.36 ml   Filed Weights   01/16/19 0500 01/17/19 0434 01/18/19 0500  Weight: 65.2 kg 67.4 kg 63.9 kg    Examination: General: 59 year old male patient remains comatose HEENT's copious oral bleeding from biting tongue currently dressed.  Orally intubated Pulmonary: Coarse scattered rhonchi FiO2:30 PEEP 5 PS 5  vts 300s rr 20s Cardiac regular rate and rhythm Abdomen soft nontender Extremities warm and dry GU clear yellow Neuro GCS 3-4 does have a cough  Resolved Hospital Problem list   Cardiac arrest secondary to hypoxemia and ineffective airway clearance  Assessment & Plan:   COVID 19 pneumonia located by aspiration pneumonia Right lung atelectasis, mucus again, status post bronchoscopy HCAP He remains ventilator dependent due to mental status Plan Full ventilator support.  Currently on minimal settings.  Not in a position for extubation due to encephalopathy Completed 5 days remdesivir Completed 10 days dexamethasone Unasyn day 4 of 5  Acute  on chronic systolic heart failure, now likely cardiogenic shock post cardiac arrest Prior ejection fraction 20 to 25%. Plan Telemetry monitoring Dosing diuretics daily based on I/O  Acute toxic and metabolic encephalopathy secondary to what appears to be anoxic brain injury post cardiac arrest further complicated by alcohol withdrawal, and metabolic derangements  Plan Minimizing sedation.  Ativan if needed Low-dose fentanyl Thiamine and folate Supportive care Continue to follow neurological status.  Appreciate neurology assistance.  Some evidence for an ischemic injury on MRI brain.  Recommended following for least 7 days for any change  in neurological function in order to assist with prognostication  Atrial fib Plan Telemetry monitoring. Heparin infusion discontinued due to tongue laceration  AKI with hyponatremia Creatinine improved, sodium  improving. Hyperkalemia Plan Lokelma now. Follow BMP IV diuresis on hold currently, following I/O Continue free water  Hyperglycemia  Plan  Sliding-scale insulin  nutrition Plan Tube feeding as ordered   Best practice:  Diet: tube feeding Pain/Anxiety/Delirium protocol (if indicated): yes VAP protocol (if indicated): yes DVT prophylaxis: SCD GI prophylaxis: famotidine Glucose control: SSI Mobility: bed rest Code Status: full Family Communication: I spoke with patient's daughter Daniel Moon 01/18/2019, updated her on patient's status.  I recommended that we continue to follow at least through the weekend for any neurological change.  If none present at that time then we will need to consider possible transition off mechanical ventilation and to comfort.  She has been in communication with her brother and sister, has not communicated significantly with the patient's mother.  The patient's children have requested that his primary contact person be changed to his son Daniel Moon.  I included his new phone number in the patient demographics. Disposition: remain in ICU  Independent CC time 77 min  Daniel Apo, MD, PhD 01/18/2019, 10:48 AM [name] [phone] or if no answer [phone]

## 2019-01-18 DEATH — deceased

## 2019-01-19 LAB — COMPREHENSIVE METABOLIC PANEL
ALT: 13 U/L (ref 0–44)
AST: 47 U/L — ABNORMAL HIGH (ref 15–41)
Albumin: 2.2 g/dL — ABNORMAL LOW (ref 3.5–5.0)
Alkaline Phosphatase: 101 U/L (ref 38–126)
Anion gap: 9 (ref 5–15)
BUN: 53 mg/dL — ABNORMAL HIGH (ref 6–20)
CO2: 25 mmol/L (ref 22–32)
Calcium: 7.8 mg/dL — ABNORMAL LOW (ref 8.9–10.3)
Chloride: 106 mmol/L (ref 98–111)
Creatinine, Ser: 1.34 mg/dL — ABNORMAL HIGH (ref 0.61–1.24)
GFR calc Af Amer: >60 mL/min (ref >60)
GFR calc non Af Amer: 58 mL/min — ABNORMAL LOW (ref >60)
Glucose, Bld: 164 mg/dL — ABNORMAL HIGH (ref 70–99)
Potassium: 5 mmol/L (ref 3.5–5.1)
Sodium: 140 mmol/L (ref 135–145)
Total Bilirubin: 0.8 mg/dL (ref 0.3–1.2)
Total Protein: 6.1 g/dL — ABNORMAL LOW (ref 6.5–8.1)

## 2019-01-19 LAB — CBC
HCT: 37.1 % — ABNORMAL LOW (ref 39.0–52.0)
Hemoglobin: 11.3 g/dL — ABNORMAL LOW (ref 13.0–17.0)
MCH: 20.1 pg — ABNORMAL LOW (ref 26.0–34.0)
MCHC: 30.5 g/dL (ref 30.0–36.0)
MCV: 66.1 fL — ABNORMAL LOW (ref 80.0–100.0)
Platelets: 86 10*3/uL — ABNORMAL LOW (ref 150–400)
RBC: 5.61 MIL/uL (ref 4.22–5.81)
RDW: 22.5 % — ABNORMAL HIGH (ref 11.5–15.5)
WBC: 8.3 10*3/uL (ref 4.0–10.5)
nRBC: 0 % (ref 0.0–0.2)

## 2019-01-19 LAB — GLUCOSE, CAPILLARY
Glucose-Capillary: 119 mg/dL — ABNORMAL HIGH (ref 70–99)
Glucose-Capillary: 145 mg/dL — ABNORMAL HIGH (ref 70–99)
Glucose-Capillary: 146 mg/dL — ABNORMAL HIGH (ref 70–99)
Glucose-Capillary: 164 mg/dL — ABNORMAL HIGH (ref 70–99)
Glucose-Capillary: 171 mg/dL — ABNORMAL HIGH (ref 70–99)
Glucose-Capillary: 81 mg/dL (ref 70–99)

## 2019-01-19 MED ORDER — ADULT MULTIVITAMIN LIQUID CH
15.0000 mL | Freq: Every day | ORAL | Status: DC
  Administered 2019-01-20 – 2019-01-26 (×7): 15 mL
  Filled 2019-01-19 (×8): qty 15

## 2019-01-19 MED ORDER — PHENYLEPHRINE HCL-NACL 10-0.9 MG/250ML-% IV SOLN
INTRAVENOUS | Status: AC
  Filled 2019-01-19: qty 250

## 2019-01-19 MED ORDER — FAMOTIDINE 40 MG/5ML PO SUSR
20.0000 mg | Freq: Every day | ORAL | Status: DC
  Administered 2019-01-19 – 2019-01-26 (×8): 20 mg
  Filled 2019-01-19 (×9): qty 2.5

## 2019-01-19 NOTE — Progress Notes (Signed)
NAME:  Jlyn Gay, MRN:  [id number], DOB:  1960-11-05, LOS: 12 ADMISSION DATE:  12/27/2018, CONSULTATION DATE:  12/27 REFERRING MD:  [place] Pink, CHIEF COMPLAINT:  dyspnea   Brief History   59 y/o male with extensive past medical history admitted on 12/21 with COVID pneumonia and CHF exacerbation.  Had a cardiac arrest on 12/27 in the setting of likely aspiration/inability to clear secretions.  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  Bilateral thalamic injury consistent with anoxia.  Heparin stopped as patient bit tongue and was bleeding excessively Micro Data:  12/21 blood > negative Respiratory culture 12/27 >> few Candida albicans  Antimicrobials:  12.21 remdesivir > 12/26 12/22 azithro > 12/29 12/22 ceftriaxone > 12/29 12/22 tocilizumab Unasyn 12/29 >>   Interim history/subjective:  Found to have a loose tooth as a source of much of his continued oral bleeding All sedation weaned to off FiO2 0.30, PEEP 5 Tolerating PSV this am  Objective   Blood pressure (!) 136/96, pulse 85, temperature (!) 97.4 F (36.3 C), temperature source Axillary, resp. rate (!) 23, height 6\' 3"  (1.905 m), weight 64.8 kg, SpO2 100 %.    Vent Mode: PRVC FiO2 (%):  [30 %-60 %] 30 % Set Rate:  [20 bmp] 20 bmp Vt Set:  [500 mL] 500 mL PEEP:  [5 cmH20] 5 cmH20 Pressure Support:  [8 cmH20] 8 cmH20 Plateau Pressure:  [19 cmH20] 19  cmH20   Intake/Output Summary (Last 24 hours) at 01/19/2019 0835 Last data filed at 01/19/2019 0800 Gross per 24 hour  Intake 1530.03 ml  Output 1375 ml  Net 155.03 ml   Filed Weights   01/17/19 0434 01/18/19 0500 01/19/19 0500  Weight: 67.4 kg 63.9 kg 64.8 kg    Examination: General: 59 year old male patient remains comatose HEENT's copious oral bleeding from biting tongue currently dressed.  Orally intubated Pulmonary: Coarse scattered rhonchi Cardiac regular rate and rhythm Abdomen soft nontender Extremities warm and dry GU clear yellow Neuro GCS 3-4 does have a cough  Resolved Hospital Problem list   Cardiac arrest secondary to hypoxemia and ineffective airway clearance  Assessment & Plan:   COVID 19 pneumonia located by aspiration pneumonia Right lung atelectasis, mucus again, status post bronchoscopy HCAP He remains ventilator dependent due to mental status Plan Okay to continue efforts at PSV but he does not have the mental status for an extubation Completed 5 days remdesivir in 10 days dexamethasone Today is Unasyn day 5 of 5 for aspiration pneumonia/HCAP  Oral bleeding, had a tongue laceration but persistent source appears to be from poor dentition and loose tooth Plan Tooth protected with gauze Anticoagulation held  Acute  on chronic systolic heart failure, now likely cardiogenic shock post cardiac arrest Prior ejection fraction 20 to 25%. Plan Telemetry monitoring Dosing diuretics daily based on I/O, -5.3 L for hospitalization  Acute toxic and metabolic encephalopathy secondary to what appears to be anoxic brain injury post cardiac arrest further complicated by alcohol withdrawal,  and metabolic derangements  Plan Minimize all sedation, avoid if at all possible to facilitate neurological exam Thiamine and folate Appreciate neurology assistance.  Note evidence for probable ischemic injury on MRI brain.  They recommended 7 days observation.  He is on minimal  sedation and I think we should follow him through the weekend and then reassess prognosis, discussed with family  Atrial fib Plan Telemetry monitoring Heparin infusion discontinued due to oral bleeding  AKI with hyponatremia Creatinine improved, sodium improving. Hyperkalemia, improved but remains elevated Plan Follow BMP Dosing diuretics daily, held on 1/2 Continue free water  Hyperglycemia  Plan  Sliding-scale insulin as per protocol  nutrition Plan Tube feeding as ordered   Best practice:  Diet: tube feeding Pain/Anxiety/Delirium protocol (if indicated): yes VAP protocol (if indicated): yes DVT prophylaxis: SCD GI prophylaxis: famotidine Glucose control: SSI Mobility: bed rest Code Status: full Family Communication: I spoke with patient's daughter [name] 01/18/2019, updated her on patient's status.  I recommended that we continue to follow at least through the weekend for any neurological change.  If none present at that time then we will need to consider possible transition off mechanical ventilation and to comfort.  She has been in communication with her brother and sister, has not communicated significantly with the patient's mother.  The patient's children have requested that his primary contact person be changed to his son [name].  I included his new phone number in the patient demographics. Disposition: remain in ICU  Independent CC time 31 min  Baltazar Apo, MD, PhD 01/19/2019, 8:35 AM [facility] [phone] or if no answer [phone]

## 2019-01-20 LAB — COMPREHENSIVE METABOLIC PANEL
ALT: 18 U/L (ref 0–44)
AST: 50 U/L — ABNORMAL HIGH (ref 15–41)
Albumin: 2.2 g/dL — ABNORMAL LOW (ref 3.5–5.0)
Alkaline Phosphatase: 108 U/L (ref 38–126)
Anion gap: 8 (ref 5–15)
BUN: 46 mg/dL — ABNORMAL HIGH (ref 6–20)
CO2: 24 mmol/L (ref 22–32)
Calcium: 7.8 mg/dL — ABNORMAL LOW (ref 8.9–10.3)
Chloride: 107 mmol/L (ref 98–111)
Creatinine, Ser: 1.15 mg/dL (ref 0.61–1.24)
GFR calc Af Amer: >60 mL/min (ref >60)
GFR calc non Af Amer: >60 mL/min (ref >60)
Glucose, Bld: 136 mg/dL — ABNORMAL HIGH (ref 70–99)
Potassium: 4.8 mmol/L (ref 3.5–5.1)
Sodium: 139 mmol/L (ref 135–145)
Total Bilirubin: 0.8 mg/dL (ref 0.3–1.2)
Total Protein: 6.1 g/dL — ABNORMAL LOW (ref 6.5–8.1)

## 2019-01-20 LAB — MAGNESIUM: Magnesium: 2 mg/dL (ref 1.7–2.4)

## 2019-01-20 LAB — CBC
HCT: 37.4 % — ABNORMAL LOW (ref 39.0–52.0)
Hemoglobin: 11.8 g/dL — ABNORMAL LOW (ref 13.0–17.0)
MCH: 20.9 pg — ABNORMAL LOW (ref 26.0–34.0)
MCHC: 31.6 g/dL (ref 30.0–36.0)
MCV: 66.2 fL — ABNORMAL LOW (ref 80.0–100.0)
Platelets: 88 10*3/uL — ABNORMAL LOW (ref 150–400)
RBC: 5.65 MIL/uL (ref 4.22–5.81)
RDW: 23.1 % — ABNORMAL HIGH (ref 11.5–15.5)
WBC: 8 10*3/uL (ref 4.0–10.5)
nRBC: 0 % (ref 0.0–0.2)

## 2019-01-20 LAB — GLUCOSE, CAPILLARY
Glucose-Capillary: 179 mg/dL — ABNORMAL HIGH (ref 70–99)
Glucose-Capillary: 139 mg/dL — ABNORMAL HIGH (ref 70–99)
Glucose-Capillary: 148 mg/dL — ABNORMAL HIGH (ref 70–99)
Glucose-Capillary: 140 mg/dL — ABNORMAL HIGH (ref 70–99)

## 2019-01-20 NOTE — Progress Notes (Signed)
Spoke with daughter and son this morning and gave updates.  Answered questions for family and set up video call for son and daughter.  Daughter Daniel Moon has stated that she is ready to take her father off from the machines.  The doctor will be calling son today to speak about decisions regarding code status.

## 2019-01-20 NOTE — Progress Notes (Signed)
Assisted tele visit to patient with family member.  Linsey Hirota Ann, RN  

## 2019-01-20 NOTE — Progress Notes (Addendum)
NAME:  Daniel Moon, MRN:  [id number], DOB:  1960/11/20, LOS: 43 ADMISSION DATE:  01/01/2019, CONSULTATION DATE:  12/27 REFERRING MD:  [place] Pink, CHIEF COMPLAINT:  dyspnea   Brief History   59 y/o male with extensive past medical history admitted on 12/21 with COVID pneumonia and CHF exacerbation.  Had a cardiac arrest on 12/27 in the setting of likely aspiration/inability to clear secretions.  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  Bilateral thalamic injury consistent with anoxia.  Heparin stopped as patient bit tongue and was bleeding excessively Micro Data:  12/21 blood > negative Respiratory culture 12/27 >> few Candida albicans  Antimicrobials:  12.21 remdesivir > 12/26 12/22 azithro > 12/29 12/22 ceftriaxone > 12/29 12/22 tocilizumab Unasyn 12/29 >>   Interim history/subjective:  FiO2 0.30, PEEP 5 Tolerates PSV No change in neurological status reported off all sedation, has opened his eyes w an upward gaze a few times  Objective   Blood pressure (!) 130/93, pulse 96, temperature (!) 97.4 F (36.3 C), temperature source Axillary, resp. rate (!) 22, height 6\' 3"  (1.905 m), weight 64.5 kg, SpO2 99 %.    Vent Mode: PRVC FiO2 (%):  [30 %] 30 % Set Rate:  [20 bmp] 20 bmp Vt Set:  [500 mL] 500 mL PEEP:  [5 cmH20] 5 cmH20 Plateau Pressure:  [18 cmH20-20 cmH20] 20 cmH20   Intake/Output Summary (Last 24  hours) at 01/20/2019 0918 Last data filed at 01/20/2019 0900 Gross per 24 hour  Intake 1945 ml  Output 2000 ml  Net -55 ml   Filed Weights   01/18/19 0500 01/19/19 0500 01/20/19 0457  Weight: 63.9 kg 64.8 kg 64.5 kg    Examination: General: 59 year old male patient remains comatose HEENT's copious oral bleeding from biting tongue currently dressed.  Orally intubated Pulmonary: Coarse scattered rhonchi Cardiac regular rate and rhythm Abdomen soft nontender Extremities warm and dry GU clear yellow Neuro No movement, does have a cough, opened eyes with stim today  Resolved Hospital Problem list   Cardiac arrest secondary to hypoxemia and ineffective airway clearance  Assessment & Plan:   COVID 19 pneumonia located by aspiration pneumonia Right lung atelectasis, mucus again, status post bronchoscopy HCAP He remains ventilator dependent due to mental status Plan Continue efforts at PSV.  Mental status is his barrier to successful extubation.  He may actually protect airway based on intact cough with suctioning.  Could consider discussing a one-way extubation with family going forward depending on goals of care Finished remdesivir 5 days, dexamethasone 10 days Completed full course Unasyn for aspiration pneumonia/HCAP  Oral bleeding, had a tongue laceration but persistent source appears to be from poor dentition and loose tooth Plan Loose tooth protected with gauze.  He is at risk for losing it Anticoagulation on hold  Acute  on chronic systolic heart failure, now likely cardiogenic shock post cardiac arrest Prior ejection fraction 20 to 25%. Plan Telemetry monitoring Dosing diuretics daily based on I/O, remains -5.3  L for the hospitalization  Acute toxic and metabolic encephalopathy secondary to what appears to be anoxic brain injury post cardiac arrest further complicated by alcohol withdrawal, and metabolic derangements  Plan Avoid all sedation to facilitate neurological  exam. Appreciate neurology assistance.  MRI brain with thalamic injury, evidence for probable anoxic injury.  Around 7 days observation.  Recommended before neurological prognostication.  We have agreed to follow him through the weekend, reassess prognosis with family at that time Thiamine and folate  Atrial fib Plan Telemetry monitoring Heparin infusion discontinued due to oral bleeding.  May be able to restart depending on resolution  AKI with hyponatremia Creatinine improved, sodium improving. Hyperkalemia, improved but remains elevated Plan Follow BMP Dose diuretics daily, none on 1/2 or 1/3 Continue free water as ordered  Thrombocytopenia Plan Following CBC, currently off heparin  Hyperglycemia  Plan  Sliding-scale insulin as per protocol  nutrition Plan Tube feeding as ordered   Best practice:  Diet: tube feeding Pain/Anxiety/Delirium protocol (if indicated): yes VAP protocol (if indicated): yes DVT prophylaxis: SCD GI prophylaxis: famotidine Glucose control: SSI Mobility: bed rest Code Status: full Family Communication: I called the patient's son [name], reviewed his clinical status, neurological status and prognosis.  The family is contemplating next steps.  I recommended that we may be headed for a compassionate withdrawal of care.  He understood this, will pass along to the extended family.  Not ready to make any changes in the [place] at this time. Disposition: remain in ICU  Independent CC time 28  min  Baltazar Apo, MD, PhD 01/20/2019, 9:18 AM [facility] [phone] or if no answer [phone]

## 2019-01-21 DIAGNOSIS — R410 Disorientation, unspecified: Secondary | ICD-10-CM

## 2019-01-21 LAB — GLUCOSE, CAPILLARY
Glucose-Capillary: 117 mg/dL — ABNORMAL HIGH (ref 70–99)
Glucose-Capillary: 118 mg/dL — ABNORMAL HIGH (ref 70–99)
Glucose-Capillary: 126 mg/dL — ABNORMAL HIGH (ref 70–99)
Glucose-Capillary: 127 mg/dL — ABNORMAL HIGH (ref 70–99)
Glucose-Capillary: 128 mg/dL — ABNORMAL HIGH (ref 70–99)
Glucose-Capillary: 144 mg/dL — ABNORMAL HIGH (ref 70–99)
Glucose-Capillary: 158 mg/dL — ABNORMAL HIGH (ref 70–99)
Glucose-Capillary: 162 mg/dL — ABNORMAL HIGH (ref 70–99)
Glucose-Capillary: 208 mg/dL — ABNORMAL HIGH (ref 70–99)

## 2019-01-21 MED ORDER — AMLODIPINE BESYLATE 10 MG PO TABS
10.0000 mg | ORAL_TABLET | Freq: Every day | ORAL | Status: DC
  Administered 2019-01-21 – 2019-01-26 (×6): 10 mg
  Filled 2019-01-21 (×2): qty 2
  Filled 2019-01-21 (×4): qty 1
  Filled 2019-01-21: qty 2
  Filled 2019-01-21 (×3): qty 1

## 2019-01-21 MED ORDER — CHLORHEXIDINE GLUCONATE 0.12 % MT SOLN
15.0000 mL | Freq: Two times a day (BID) | OROMUCOSAL | Status: DC
  Administered 2019-01-21 – 2019-01-29 (×11): 15 mL via OROMUCOSAL
  Filled 2019-01-21 (×9): qty 15

## 2019-01-21 NOTE — Consult Note (Addendum)
Requesting Physician: Dr. Lamonte Sakai    Chief Complaint: Unresponsive  History obtained from:  Chart    HPI:                                                                                                                                       Daniel Moon is a 59 y.o. male with past medical history of hypertension, hyperlipidemia, diabetes mellitus, systolic congestive heart failure, left ventricular thrombus peripheral vascular disease with left BKA, MRSA bacteremia, substance abuse, paroxysmal A. fib admitted with COVID-19 pneumonia on 12/21.  On 12/27 patient had cardiac arrest likely due to aspiration/hypoxemia.  Just prior to this patient was also being more agitated likely due to alcohol withdrawal and received 1 dose of Ativan as per CIWA protocol. 1 hour later he became severely bradycardic and then pulseless.  Patient received CPR and then intubated for airway protection.  He has subsequently been unresponsive and continues to remain unresponsive despite weaning of all sedation, now day 7 from his cardiac arrest  An EEG obtained on 12/28 was negative for seizures and an MRI brain was obtained on 12/29 which showed signal changes in bilateral thalami suggestive for "mild anoxic injury" per MRI report.  As patient has remained unresponsive despite being off sedation, neurology consulted for further recommendations.  Patient electrolytes, BUN have been within normal limits.   Past Medical History:  Diagnosis Date  . Diabetes mellitus without complication ([place])   . Heart attack ([place])   . Hepatitis C   . HLD (hyperlipidemia)   . HTN (hypertension)   . Peripheral vascular disease ([place])   . Systolic CHF [facility])     Past Surgical History:  Procedure Laterality Date  . ABSCESS DRAINAGE    . AMPUTATION Left 10/02/2018   Procedure: left BELOW KNEE AMPUTATION;  Surgeon: Angelia Mould, MD;  Location: [place];  Service: Vascular;  Laterality: Left;  . TEE WITHOUT CARDIOVERSION N/A  10/05/2018   Procedure: TRANSESOPHAGEAL ECHOCARDIOGRAM (TEE);  Surgeon: Jerline Pain, MD;  Location: [facility] ENDOSCOPY;  Service: Cardiovascular;  Laterality: N/A;    Family History  Problem Relation Age of Onset  . Cancer Father   . Hypertension Father   . Heart disease Father   . Diabetes Father   . Stroke Sister   . Stroke Brother    Social History:  reports that he has been smoking. He has never used smokeless tobacco. He reports current alcohol use. He reports current drug use.  Allergies:  Allergies  Allergen Reactions  . Amoxicillin-Pot Clavulanate Nausea And Vomiting    Did it involve swelling of the face/tongue/throat, SOB, or low BP? No Did it involve sudden or severe rash/hives, skin peeling, or any reaction on the inside of your mouth or nose? No Did you need to seek medical attention at a hospital or doctor's office? No When did it last happen?6 months ago If all  above answers are "NO", may proceed with cephalosporin use.    Medications:                                                                                                                        I reviewed home medications   ROS:                                                                                                                                     14 systems reviewed and negative except above    Examination:                                                                                                      General: Appears well-developed and well-nourished.  Psych: Affect appropriate to situation Eyes: No scleral injection HENT: No OP obstrucion Head: Normocephalic.  Cardiovascular: Normal rate and regular rhythm.  Respiratory: Effort normal and breath sounds normal to anterior ascultation GI: Soft.  No distension. There is no tenderness.  Skin: WDI    Neurological Examination Mental Status: Patient does not respond to verbal stimuli.  Opens eyes to sternal rub.  Does not follow  commands.  No verbalizations noted.  Cranial Nerves: II: patient does not respond confrontation bilaterally, pupils sluggish right 4 mm mm, left 22mm III,IV,VI: doll's response present V,VII: corneal reflex: Present in both eyes VIII: patient does not respond to verbal stimuli IX,X: gag reflex, cough reflex present XI: trapezius strength unable to test bilaterally XII: tongue strength unable to test Motor: Withdraws in both upper extremities, left greater than right.  Left BKA.  No spontaneous movement noted.  No purposeful movements noted. Sensory: Unable to assess, does not grimace to pain Deep Tendon Reflexes: 2+ over bilateral biceps, right patella Plantar: Mute Cerebellar: Unable to performapid alternating movements and normal heel-to-shin test      Lab Results: Basic Metabolic Panel: Recent Labs  Lab 01/16/19 0500 01/16/19 1700 01/17/19 0430 01/18/19 0410 01/18/19 1457 01/19/19 0426 01/20/19  0500  NA 147*  --  144 144 142 140 139  K 4.0  --  5.0 6.2* 4.7 5.0 4.8  CL 109  --  106 109 108 106 107  CO2 30  --  30 25 26 25 24   GLUCOSE 172*  --  211* 130* 152* 164* 136*  BUN 53*  --  51* 53* 53* 53* 46*  CREATININE 1.54*  --  1.51* 1.52* 1.46* 1.34* 1.15  CALCIUM 7.7*  --  7.8* 7.9* 8.0* 7.8* 7.8*  MG 1.9 2.0 1.8  --   --   --  2.0  PHOS 2.1* 2.9 2.2*  --   --   --   --     CBC: Recent Labs  Lab 01/16/19 0500 01/17/19 0430 01/18/19 0410 01/19/19 0426 01/20/19 0500  WBC 17.1* 11.9* 10.6* 8.3 8.0  HGB 12.4* 11.5* 11.6* 11.3* 11.8*  HCT 39.4 36.4* 37.4* 37.1* 37.4*  MCV 64.5* 65.0* 66.0* 66.1* 66.2*  PLT 114* 109* 93* 86* 88*    Coagulation Studies: No results for input(s): LABPROT, INR in the last 72 hours.  Imaging: No results found.   I have reviewed the above imaging :   MRI brain 12/29:  IMPRESSION: 1. Subtly increased diffusion and T2/FLAIR signal involving the perirolandic cortices and thalami bilaterally, most likely reflecting changes  related to mild anoxic injury related to recent cardiac arrest. 2. No other acute intracranial abnormality. 3. Few scattered remote cortical infarcts involving the right cerebral and left cerebellar hemispheres. 4. Underlying mild chronic small vessel ischemic disease.  EEG 12/28:  ABNORMALITY - Background suppression, generalized - Intermittent slow, generalozed  IMPRESSION: This study is suggestive of profound diffuse encephalopathy, non specific to etiology. No seizures or epileptiform discharges were seen throughout the recording.   ASSESSMENT AND PLAN  58 year old male with multiple medical comorbidities who developed acute hypoxia and went into pulseless cardiac arrest status post CPR.  Time to ROSC unclear per chart.  Patient remains unresponsive, however does have brainstem reflexes intact.  Unfortunately, not demonstrating higher cortical functions.  I suspect patient had anoxic brain injury as demonstrated on MRI brain.  While bilateral thalamic involvement can occur in Wernicke's, patient was on thiamine replacement early on admission and therefore unlikely.   Given that he is post cardiac arrest day 7 well he does have preserved brain stem reflexes, he is still not localizing on motor exam, his likelihood of severe disability is  high.   Anoxic brain injury - COVID Encephlaopathy - likely poor prognosis  Recommendations Continue supportive treatment for now Palliative care consult  Check ammonia level    Sushanth Aroor [facility] Pager Number [phone]

## 2019-01-21 NOTE — Progress Notes (Signed)
NAME:  Daniel Moon, MRN:  [id number], DOB:  12-16-1960, LOS: 43 ADMISSION DATE:  12/20/2018, CONSULTATION DATE:  12/27 REFERRING MD:  Maryland Pink, CHIEF COMPLAINT:  dyspnea   Brief History   59 y/o male with extensive past medical history admitted on 12/21 with COVID pneumonia and CHF exacerbation.  Had a cardiac arrest on 12/27 in the setting of likely aspiration/inability to clear secretions.  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  Bilateral thalamic injury consistent with anoxia.  Heparin stopped as patient bit tongue and was bleeding excessively Micro Data:  12/21 blood > negative Respiratory culture 12/27 >> few Candida albicans  Antimicrobials:  12.21 remdesivir > 12/26 12/22 azithro > 12/29 12/22 ceftriaxone > 12/29 12/22 tocilizumab Unasyn 12/29 >> 1/3  Interim history/subjective:  Tolerates PSV ad lib. FiO2 0.30, PEEP 5 He has been off all sedation for 4 days Some hypertension today  Objective   Blood pressure (!) 141/97, pulse 92, temperature 98 F (36.7 C), temperature source Oral, resp. rate (!) 28, height 6\' 3"  (1.905 m), weight 64 kg, SpO2 100 %.    Vent Mode: PSV;CPAP FiO2 (%):  [30 %] 30 % Set Rate:  [20 bmp] 20 bmp Vt Set:  [500 mL] 500 mL PEEP:  [5 cmH20] 5 cmH20 Pressure Support:  [8 cmH20] 8 cmH20 Plateau Pressure:  [18 cmH20-23 cmH20] 18 cmH20   Intake/Output Summary (Last 24  hours) at 01/21/2019 0949 Last data filed at 01/21/2019 0600 Gross per 24 hour  Intake 1530 ml  Output 1900 ml  Net -370 ml   Filed Weights   01/19/19 0500 01/20/19 0457 01/21/19 0500  Weight: 64.8 kg 64.5 kg 64 kg    Examination: General: 59 year old male patient remains comatose HEENT's copious oral bleeding from biting tongue currently dressed.  Orally intubated Pulmonary: Coarse scattered rhonchi Cardiac regular rate and rhythm Abdomen soft nontender Extremities warm and dry GU clear yellow Neuro No movement, does have a cough, opened eyes with stim today  Resolved Hospital Problem list   Cardiac arrest secondary to hypoxemia and ineffective airway clearance  Assessment & Plan:   COVID 19 pneumonia located by aspiration pneumonia Right lung atelectasis, mucus again, status post bronchoscopy HCAP He remains ventilator dependent due to mental status Plan Tolerates PSV, mental status is barrier to successful extubation.  He does have a strong cough but shows signs of anoxic injury.  Discussed a possible one-way extubation with his son by phone on 1/3.  Family members are discussing goals for care. Remdesivir and dexamethasone both completed Unasyn course for possible aspiration/HCAP completed  Oral bleeding, had a tongue laceration but persistent source appears to be from poor dentition and loose tooth Plan Loose tooth protected with gauze.  I do not see any active bleeding currently Anticoagulation on hold  Acute  on chronic systolic heart failure, cardiogenic shock post cardiac arrest, hemodynamically improved Prior ejection fraction 20 to 25%. Plan Telemetry monitoring Dosing diuretics daily based on I/O, net 5.6  L negative  Acute toxic and metabolic encephalopathy secondary to what appears to be anoxic brain injury post cardiac arrest further complicated by alcohol withdrawal, and metabolic derangements  Plan Avoiding all sedation to facilitate neurology  examination MRI brain with bilateral thalamic injuries, evidence for probable anoxic injury.  We have been observing for 8 days, now off all sedation for 4 days. No significant improvement in neuro exam.  Based on my discussions with family, members are at different levels of acceptance with different thoughts about withdrawal of care.  I suspect that we will need a formal neurology evaluation and opinion to help with the decision-making. Thiamine and folate  Atrial fib.history of LV thrombus Plan Telemetry monitoring Heparin infusion discontinued due to oral bleeding.  Plan to try to restart without a bolus on 1/4  AKI with hyponatremia Creatinine improved, sodium improving. Hyperkalemia, improved but remains elevated Plan Follow BMP Dose diuretics daily, none given for the last 3 days Continue free water as ordered  Thrombocytopenia Plan Follow CBC, currently off heparin  Hypertension Plan Start amlodipine today  Hyperglycemia  Plan  Sliding-scale insulin as per protocol Low-dose tube feed coverage  nutrition Plan Tube feeding as ordered   Best practice:  Diet: tube feeding Pain/Anxiety/Delirium protocol (if indicated): yes VAP protocol (if indicated): yes DVT prophylaxis: SCD GI prophylaxis: famotidine Glucose control: SSI Mobility: bed rest Code Status: full Family Communication:  -I called the patient's son [name] 1/3, reviewed his clinical status, neurological status and prognosis.  The family is contemplating next steps.  I recommended that we may be headed for a compassionate withdrawal of care.  He understood this, will pass along to the extended family.   -Discussed with him on 1/4, informed him that we await neurology consult. Disposition: remain in ICU  Independent CC time 63  min  Baltazar Apo, MD, PhD 01/21/2019, 9:49 AM [facility] [phone] or if no answer [phone]

## 2019-01-22 LAB — AMMONIA: Ammonia: 27 umol/L (ref 9–35)

## 2019-01-22 LAB — BASIC METABOLIC PANEL
Anion gap: 7 (ref 5–15)
BUN: 37 mg/dL — ABNORMAL HIGH (ref 6–20)
CO2: 21 mmol/L — ABNORMAL LOW (ref 22–32)
Calcium: 7.7 mg/dL — ABNORMAL LOW (ref 8.9–10.3)
Chloride: 106 mmol/L (ref 98–111)
Creatinine, Ser: 0.98 mg/dL (ref 0.61–1.24)
GFR calc Af Amer: >60 mL/min (ref >60)
GFR calc non Af Amer: >60 mL/min (ref >60)
Glucose, Bld: 128 mg/dL — ABNORMAL HIGH (ref 70–99)
Potassium: 4.5 mmol/L (ref 3.5–5.1)
Sodium: 134 mmol/L — ABNORMAL LOW (ref 135–145)

## 2019-01-22 LAB — MAGNESIUM: Magnesium: 2 mg/dL (ref 1.7–2.4)

## 2019-01-22 LAB — GLUCOSE, CAPILLARY
Glucose-Capillary: 137 mg/dL — ABNORMAL HIGH (ref 70–99)
Glucose-Capillary: 122 mg/dL — ABNORMAL HIGH (ref 70–99)
Glucose-Capillary: 123 mg/dL — ABNORMAL HIGH (ref 70–99)
Glucose-Capillary: 112 mg/dL — ABNORMAL HIGH (ref 70–99)
Glucose-Capillary: 123 mg/dL — ABNORMAL HIGH (ref 70–99)
Glucose-Capillary: 124 mg/dL — ABNORMAL HIGH (ref 70–99)

## 2019-01-22 LAB — CBC
HCT: 36.9 % — ABNORMAL LOW (ref 39.0–52.0)
Hemoglobin: 11.5 g/dL — ABNORMAL LOW (ref 13.0–17.0)
MCH: 20.6 pg — ABNORMAL LOW (ref 26.0–34.0)
MCHC: 31.2 g/dL (ref 30.0–36.0)
MCV: 66.2 fL — ABNORMAL LOW (ref 80.0–100.0)
Platelets: 88 10*3/uL — ABNORMAL LOW (ref 150–400)
RBC: 5.57 MIL/uL (ref 4.22–5.81)
RDW: 23.5 % — ABNORMAL HIGH (ref 11.5–15.5)
WBC: 6.6 10*3/uL (ref 4.0–10.5)
nRBC: 0 % (ref 0.0–0.2)

## 2019-01-22 LAB — HEPATIC FUNCTION PANEL
ALT: 19 U/L (ref 0–44)
AST: 53 U/L — ABNORMAL HIGH (ref 15–41)
Albumin: 2.1 g/dL — ABNORMAL LOW (ref 3.5–5.0)
Alkaline Phosphatase: 117 U/L (ref 38–126)
Bilirubin, Direct: 0.2 mg/dL (ref 0.0–0.2)
Indirect Bilirubin: 0.5 mg/dL (ref 0.3–0.9)
Total Bilirubin: 0.7 mg/dL (ref 0.3–1.2)
Total Protein: 5.8 g/dL — ABNORMAL LOW (ref 6.5–8.1)

## 2019-01-22 LAB — PHOSPHORUS: Phosphorus: 2.8 mg/dL (ref 2.5–4.6)

## 2019-01-22 NOTE — Progress Notes (Signed)
Spoke to son and daughter (contact on Epic), updated him about my impression that he likely has anoxic brian injury, and has low likelihood of good prognosis, especially given co-morbidities.   Family wants to wait for few more days, apparently there are total of 6 children. Oldest daughter and son are leaning towards not pursuing tracheostomy.

## 2019-01-22 NOTE — Progress Notes (Signed)
Attempted to reach out to patient's son [name], mailbox was full I was unable to reach him today.  I do note that neurology spoke to him today and they discussed neurology impression.  Family leaning towards no tracheostomy and compassionate extubation but need more time to discuss  I will reach out again on 1/6  Erick Colace ACNP-BC [place] Pager # [phone] OR # [phone] if no answer

## 2019-01-22 NOTE — Progress Notes (Signed)
NAME:  Daniel Moon, MRN:  [id number], DOB:  January 31, 1960, LOS: 70 ADMISSION DATE:  12/21/2018, CONSULTATION DATE:  12/27 REFERRING MD:  [place] Pink, CHIEF COMPLAINT:  dyspnea   Brief History   59 y/o male with extensive past medical history admitted on 12/21 with COVID pneumonia and CHF exacerbation.  Had a cardiac arrest on 12/27 in the setting of likely aspiration/inability to clear secretions.  Past Medical History  Afib Polysubstance abuse Paroxysmal atrial fib L BKA stump infection with MRSA DM2 COPD  Significant Hospital Events   12/21 admission 12/27 cardiac arrest, intubation 1/5: Still not responding.  Has been on no sedation for several days. They concur about severe brain injury and suspect sig debilitation as consequence of arrest.  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  Bilateral thalamic injury consistent with anoxia.  Heparin stopped as patient bit tongue and was bleeding excessively Micro Data:  12/21 blood > negative Respiratory culture 12/27 >> few Candida albicans  Antimicrobials:  12.21 remdesivir > 12/26 12/22 azithro > 12/29 12/22 ceftriaxone > 12/29 12/22 tocilizumab Unasyn 12/29 >> 1/3  Interim history/subjective:  No significant changes Objective   Blood pressure 139/89, pulse 92, temperature 98.9 F (37.2 C), temperature source Oral, resp. rate (Abnormal) 22, height 6\' 3"  (1.905 m), weight 64 kg, SpO2 99 %.    Vent Mode: PSV;CPAP FiO2 (%):  [30 %] 30 % Set Rate:  [20 bmp] 20 bmp Vt Set:  [500 mL] 500 mL PEEP:  [5 cmH20] 5 cmH20 Pressure Support:  [8 cmH20] 8  cmH20 Plateau Pressure:  [20 cmH20] 20 cmH20   Intake/Output Summary (Last 24 hours) at 01/22/2019 0950 Last data filed at 01/22/2019 0000 Gross per 24 hour  Intake 670 ml  Output 1375 ml  Net -705 ml   Filed Weights   01/20/19 0457 01/21/19 0500 01/22/19 0500  Weight: 64.5 kg 64 kg 64 kg    Examination:  General 59 year old black male remains minimally responsive on ventilator HEENT normocephalic decreased oral bleeding orally intubated Pulmonary: Coarse scattered rhonchi no accessory use.  Currently on spontaneous ventilation with pressure support 5 PEEP of 5, tidal volume in the mid 400 range without accessory use Cardiac: Regular irregular no murmur rub or gallop Abdomen: Soft nontender no organomegaly Extremities warm and dry Neuro: Minimal withdrawal to pain, questionable moving head spontaneously, does exhibit chewing type activity.  No purposeful movement appreciated GU clear yellow  Resolved Hospital Problem list   Cardiac arrest secondary to hypoxemia and ineffective airway clearance HCAP Assessment & Plan:   COVID 19 pneumonia located by aspiration pneumonia Right lung atelectasis, mucus plugging He remains ventilator dependent due to mental status Has completed remdesivir and steroids Plan Continue full ventilator support with pressure support as tolerated VAP bundle Revisit goals of care with family  Oral bleeding, had a tongue laceration but persistent source appears to be from poor dentition and loose tooth Plan Anticoagulation on hold Loose tooth protected with gauze  Acute  on chronic systolic heart failure, cardiogenic shock post cardiac arrest, hemodynamically improved Prior ejection fraction 20 to 25%. Plan Continue telemetry monitoring  Acute toxic and metabolic encephalopathy secondary to what  appears to be anoxic brain injury post cardiac arrest further complicated by alcohol withdrawal, and metabolic derangements -Seen by neurology, concur about  anoxic injury, anticipate high-level of severe disability Plan Avoiding all sedating medications Will revisit with family  Atrial fib.history of LV thrombus Plan Continuing telemetry monitoring IV heparin was stopped due to oral bleeding, will reassess this, could consider resuming however I am not sure this will affect his overall prognosis much at this point  AKI  Creatinine improved, sodium now lower Hyperkalemia, improved but remains elevated -still neg volume status Plan Change free water to every 8 hours Monitor intermittently  Thrombocytopenia Plan Follow CBC off heparin  Hypertension Plan Continue amlodipine  Hyperglycemia  Plan  No change in sliding scale or tube feed coverage  nutrition Plan Continuing tube feeds   Best practice:  Diet: tube feeding Pain/Anxiety/Delirium protocol (if indicated): yes VAP protocol (if indicated): yes DVT prophylaxis: SCD GI prophylaxis: famotidine Glucose control: SSI Mobility: bed rest Code Status: full Family Communication:   son Lonnie  Critical care x32 minutes  Erick Colace ACNP-BC [place] Pager # [phone] OR # [phone] if no answer

## 2019-01-22 NOTE — Progress Notes (Signed)
Patients mother Consuello Closs) called and requested an update of her son's current medical condition. We had a discussion approximately 5-59min.

## 2019-01-23 ENCOUNTER — Inpatient Hospital Stay (HOSPITAL_COMMUNITY): Payer: Medicaid Other

## 2019-01-23 LAB — GLUCOSE, CAPILLARY
Glucose-Capillary: 127 mg/dL — ABNORMAL HIGH (ref 70–99)
Glucose-Capillary: 131 mg/dL — ABNORMAL HIGH (ref 70–99)
Glucose-Capillary: 127 mg/dL — ABNORMAL HIGH (ref 70–99)
Glucose-Capillary: 118 mg/dL — ABNORMAL HIGH (ref 70–99)
Glucose-Capillary: 162 mg/dL — ABNORMAL HIGH (ref 70–99)

## 2019-01-23 MED ORDER — VITAL AF 1.2 CAL PO LIQD
1000.0000 mL | ORAL | Status: DC
  Administered 2019-01-23 – 2019-01-26 (×4): 1000 mL

## 2019-01-23 NOTE — Progress Notes (Signed)
eLink Physician-Brief Progress Note Patient Name: [name] DOB: 1960-11-07 MRN: [id number]   Date of Service  01/23/2019  HPI/Events of Note  Request for Abdominal film for NGT placement.   eICU Interventions  Will order abdominal film for NGT placement.      Intervention Category Major Interventions: Other:  Lysle Dingwall 01/23/2019, 3:44 AM

## 2019-01-23 NOTE — Progress Notes (Signed)
Attempted to reach [facility] (primary contact) Mail box full   Will try again 1/7  Erick Colace ACNP-BC [place] Pager # [phone] OR # [phone] if no answer

## 2019-01-23 NOTE — Progress Notes (Signed)
Called and spoke with Daniel Moon and asked her to speak with MD about possible need to verify OG placement via xray due to there being no cm markings documented from initial insertion. RN able to auscultate air via OG tube.

## 2019-01-23 NOTE — Progress Notes (Signed)
NAME:  Daniel Moon, MRN:  [id number], DOB:  07-17-1960, LOS: 54 ADMISSION DATE:  01/11/2019, CONSULTATION DATE:  12/27 REFERRING MD:  Mary[place] Pink, CHIEF COMPLAINT:  dyspnea   Brief History   59 y/o male with extensive past medical history admitted on 12/21 with COVID pneumonia and CHF exacerbation.  Had a cardiac arrest on 12/27 in the setting of likely aspiration/inability to clear secretions.  Past Medical History  Afib Polysubstance abuse Paroxysmal atrial fib L BKA stump infection with MRSA DM2 COPD  Significant Hospital Events   12/21 admission 12/27 cardiac arrest, intubation 1/5: Still not responding.  Has been on no sedation for several days. They concur about severe brain injury and suspect sig debilitation as consequence of arrest.  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  Bilateral thalamic injury consistent with anoxia.  Heparin stopped as patient bit tongue and was bleeding excessively Micro Data:  12/21 blood > negative Respiratory culture 12/27 >> few Candida albicans  Antimicrobials:  12.21 remdesivir > 12/26 12/22 azithro > 12/29 12/22 ceftriaxone > 12/29 12/22 tocilizumab Unasyn 12/29 >> 1/3  Interim history/subjective:  No change   Objective   Blood pressure 126/87, pulse 92, temperature 98.7 F (37.1 C), temperature source Oral, resp. rate (Abnormal) 21, height 6\' 3"  (1.905 m), weight 64.1 kg, SpO2 99 %.    Vent Mode: PRVC FiO2 (%):  [30 %] 30 % Set Rate:  [20 bmp] 20 bmp Vt Set:  [500 mL] 500 mL PEEP:  [5 cmH20] 5 cmH20 Pressure Support:  [8 cmH20] 8 cmH20 Plateau  Pressure:  [17 cmH20] 17 cmH20   Intake/Output Summary (Last 24 hours) at 01/23/2019 0945 Last data filed at 01/23/2019 0700 Gross per 24 hour  Intake 2025 ml  Output 1150 ml  Net 875 ml   Filed Weights   01/21/19 0500 01/22/19 0500 01/23/19 0355  Weight: 64 kg 64 kg 64.1 kg    Examination:  General- this is a 59 year old male; remains ventilator dependant  HENT NCAT no JVD Pulm scattered rhonchi. Equal chest rise bilaterally currently Vt 400-500 on PSV 8/peep 5 Card regular irregular abd soft not tender + bowel sounds Ext left BKA brisk CR + pulses Neuro unresponsive. Does have have cough and gag gu cl yellow   Resolved Hospital Problem list   Cardiac arrest secondary to hypoxemia and ineffective airway clearance HCAP Assessment & Plan:   COVID 19 pneumonia located by aspiration pneumonia Right lung atelectasis, mucus plugging He remains ventilator dependent due to mental status Has completed remdesivir and steroids Plan Continuing pressure support as tolerated VAP bundle Anticipating compassionate extubation once all family members on board  Oral bleeding, had a tongue laceration but persistent source appears to be from poor dentition and loose tooth Plan Anticoagulation on hold  Acute  on chronic systolic heart failure, cardiogenic shock post cardiac arrest, hemodynamically improved Prior ejection fraction 20 to 25%. Plan Continue telemetry Acute toxic and metabolic encephalopathy secondary to what appears to be anoxic brain injury post cardiac arrest further complicated by alcohol withdrawal, and metabolic derangements -Seen by neurology, concur about anoxic injury, anticipate high-level of severe disability Plan Avoiding sedating  medications Awaiting further family discussion  Atrial fib.history of LV thrombus Plan Continue telemetry monitoring  We will continue to hold heparin for now, his prognosis is quite poor, doubt that continuing anticoagulation will  help this any  AKI  Creatinine improved, sodium now lower Hyperkalemia, improved but remains elevated -still neg volume status Plan Continue free water at every 8 hours We will repeat chemistry in the morning  Thrombocytopenia Plan Follow off from heparin  Hypertension Plan Continue amlodipine  Hyperglycemia  Plan  No change in current glycemic control regimen  nutrition Plan Continue tube feeds   Best practice:  Diet: tube feeding Pain/Anxiety/Delirium protocol (if indicated): yes VAP protocol (if indicated): yes DVT prophylaxis: SCD GI prophylaxis: famotidine Glucose control: SSI Mobility: bed rest Code Status: full Family Communication:   son Daniel Moon  Critical care x32 minutes  Erick Colace ACNP-BC [place] Pager # [phone] OR # [phone] if no answer

## 2019-01-23 NOTE — Progress Notes (Signed)
 Nutrition Follow-up  DOCUMENTATION CODES:   Not applicable  INTERVENTION:   Tube Feeding: Increase Vital AF 1.2 at 70 ml/hr Provides 126 g of protein, 2016 kcals and 1361 mL of free water  Total free water with flushes: 2161 mL of free water  NUTRITION DIAGNOSIS:   Increased nutrient needs related to acute illness(COVID-19) as evidenced by estimated needs.  Being addressed via TF   GOAL:   Patient will meet greater than or equal to 90% of their needs  Progressing  MONITOR:   Vent status, Labs, Weight trends, TF tolerance, Skin, I & O's  REASON FOR ASSESSMENT:   Consult Enteral/tube feeding initiation and management  ASSESSMENT:   59 year old male who presented to the ED on 12/21 with weakness and SOB. Pt also COVID-19 positive. 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   12/21 Admit 12/27 Cardiac arrest, intubation  Noted poor prognosis Patient is currently intubated on ventilator support MV: 9.6 L/min Temp (24hrs), Avg:98.6 F (37 C), Min:97.9 F (36.6 C), Max:99 F (37.2 C)  OG tube with tip in stomach  Vital AF 1.2 at 65 ml/hr, free water 200 mL q 6 hours  Current weight 64 kg; admit weight 76 kg  Labs: reviewed Meds: MVI, thiamine  Diet Order:   Diet Order            Diet NPO time specified  Diet effective now              EDUCATION NEEDS:   No education needs have been identified at this time  Skin:  Skin Assessment: Skin Integrity Issues: Skin Integrity Issues:: Stage II Stage II: sacrum Other: s/p left BKA  Last BM:  1/6 rectal tube  Height:   Ht Readings from Last 1 Encounters:  01/10/19 6\' 3"  (1.905 m)    Weight:   Wt Readings from Last 1 Encounters:  01/23/19 64.1 kg    Ideal Body Weight:  83.3 kg(adjusted for left BKA)  BMI:  Body mass index is 17.66 kg/m.  Estimated Nutritional Needs:   Kcal:  [id number] kcals  Protein:   110-130 g  Fluid:  >/= 2 L   Cliffton Spradley MS, RDN, LDN, CNSC [phone] Pager  [phone] Weekend/On-Call Pager

## 2019-01-24 DIAGNOSIS — Z9911 Dependence on respirator [ventilator] status: Secondary | ICD-10-CM

## 2019-01-24 LAB — GLUCOSE, CAPILLARY
Glucose-Capillary: 112 mg/dL — ABNORMAL HIGH (ref 70–99)
Glucose-Capillary: 114 mg/dL — ABNORMAL HIGH (ref 70–99)
Glucose-Capillary: 128 mg/dL — ABNORMAL HIGH (ref 70–99)
Glucose-Capillary: 166 mg/dL — ABNORMAL HIGH (ref 70–99)
Glucose-Capillary: 170 mg/dL — ABNORMAL HIGH (ref 70–99)
Glucose-Capillary: 190 mg/dL — ABNORMAL HIGH (ref 70–99)

## 2019-01-24 NOTE — Progress Notes (Signed)
NAME:  Daniel Moon, MRN:  [id number], DOB:  1960/06/01, LOS: 99 ADMISSION DATE:  12/25/2018, CONSULTATION DATE:  12/27 REFERRING MD:  Mary[place] Pink, CHIEF COMPLAINT:  dyspnea   Brief History   59 y/o male with extensive past medical history admitted on 12/21 with COVID pneumonia and CHF exacerbation.  Had a cardiac arrest on 12/27 in the setting of likely aspiration/inability to clear secretions.  Past Medical History  Afib Polysubstance abuse Paroxysmal atrial fib L BKA stump infection with MRSA DM2 COPD  Significant Hospital Events   12/21 admission 12/27 cardiac arrest, intubation 1/5: Still not responding.  Has been on no sedation for several days. They concur about severe brain injury and suspect sig debilitation as consequence of arrest.  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  Bilateral thalamic injury consistent with anoxia.  Heparin stopped as patient bit tongue and was bleeding excessively Micro Data:  12/21 blood > negative Respiratory culture 12/27 >> few Candida albicans  Antimicrobials:  12.21 remdesivir > 12/26 12/22 azithro > 12/29 12/22 ceftriaxone > 12/29 12/22 tocilizumab Unasyn 12/29 >> 1/3  Interim history/subjective:  same  Objective   Blood pressure (Abnormal) 138/93, pulse 92, temperature 99.8 F (37.7 C), temperature source Oral, resp. rate 20, height 6\' 3"  (1.905 m), weight 64.8 kg, SpO2 100 %.    Vent Mode: PRVC FiO2 (%):  [30 %] 30 % Set Rate:  [20 bmp] 20 bmp Vt Set:  [500 mL] 500 mL PEEP:  [5 cmH20] 5 cmH20 Pressure Support:  [8 cmH20] 8 cmH20 Plateau Pressure:   [15 cmH20-18 cmH20] 15 cmH20   Intake/Output Summary (Last 24 hours) at 01/24/2019 1129 Last data filed at 01/24/2019 0600 Gross per 24 hour  Intake no documentation  Output 1700 ml  Net -1700 ml   Filed Weights   01/22/19 0500 01/23/19 0355 01/24/19 0500  Weight: 64 kg 64.1 kg 64.8 kg    Examination:  General 16 yom remains in PVS HENT orally intubated. No further gum bleeding Pulm scattered rhonchi no accessory use on PSV of 8/peep 5 Vts 400-500 Card RRR no MRG abd not tender + bowel sounds Ext brisk CR Neuro coughs w/ sxn faint UE posturing wth noxious stim such as cough gu clear yellow   Resolved Hospital Problem list   Cardiac arrest secondary to hypoxemia and ineffective airway clearance HCAP Assessment & Plan:   COVID 19 pneumonia located by aspiration pneumonia Right lung atelectasis, mucus plugging He remains ventilator dependent due to mental status Has completed remdesivir and steroids Plan PSV as tolerated VAP bundle   Oral bleeding, had a tongue laceration but persistent source appears to be from poor dentition and loose tooth Plan No AC  Acute  on chronic systolic heart failure, cardiogenic shock post cardiac arrest, hemodynamically improved Prior ejection fraction 20 to 25%. Plan Tele  Acute toxic and metabolic encephalopathy secondary to what appears to be anoxic brain injury post cardiac arrest further complicated by alcohol withdrawal, and metabolic derangements -Seen by neurology, concur about anoxic injury, anticipate high-level of severe disability Plan Hold sedating meds On-going attempts to discuss goals of care w/ family  Atrial fib.history of LV  thrombus Plan Tele monitoring  No heparin. Not going to impact prognosis  AKI  Creatinine improved, sodium now lower Hyperkalemia, improved but remains elevated -still neg volume status Plan Cont free water Am chemistry   Thrombocytopenia Plan Follow off  heparin  Hypertension Plan Cont amilodapine  Hyperglycemia  Plan  No change in glycemic ctl  nutrition Plan Cont tfs  Best practice:  Diet: tube feeding Pain/Anxiety/Delirium protocol (if indicated): yes VAP protocol (if indicated): yes DVT prophylaxis: SCD GI prophylaxis: famotidine Glucose control: SSI Mobility: bed rest Code Status: full Family Communication:   son Lonnie  Critical care x31 minutes  Erick Colace ACNP-BC [place] Pager # [phone] OR # [phone] if no answer

## 2019-01-24 NOTE — Progress Notes (Signed)
I spoke to the patient's son and daughter via phone.  The plan will be to proceed with extubation over the weekend.  Family wants a chance to come and see their father prior to this.  They will contact the unit.  In the meantime we will continue supportive care but I have placed a do not sedate order in the chart  Erick Colace ACNP-BC [place] Pager # [phone] OR # [phone] if no answer

## 2019-01-25 LAB — GLUCOSE, CAPILLARY
Glucose-Capillary: 119 mg/dL — ABNORMAL HIGH (ref 70–99)
Glucose-Capillary: 153 mg/dL — ABNORMAL HIGH (ref 70–99)
Glucose-Capillary: 158 mg/dL — ABNORMAL HIGH (ref 70–99)
Glucose-Capillary: 158 mg/dL — ABNORMAL HIGH (ref 70–99)
Glucose-Capillary: 165 mg/dL — ABNORMAL HIGH (ref 70–99)
Glucose-Capillary: 87 mg/dL (ref 70–99)

## 2019-01-25 MED ORDER — MORPHINE SULFATE (PF) 2 MG/ML IV SOLN
1.0000 mg | INTRAVENOUS | Status: DC | PRN
  Filled 2019-01-25: qty 1

## 2019-01-25 MED ORDER — MORPHINE SULFATE (PF) 2 MG/ML IV SOLN
2.0000 mg | Freq: Once | INTRAVENOUS | Status: AC
  Administered 2019-01-25: 2 mg via INTRAVENOUS
  Filled 2019-01-25: qty 1

## 2019-01-25 NOTE — Progress Notes (Signed)
S: No significant change on exam.  Mental Status: Patient does not respond to verbal stimuli.  Opens eyes to deep sternal rub. Does nto track examiner.  Does not follow commands.  Cranial Nerves: II: pupils equal and reactive, sluggish III,IV,VI: doll's response present V,VII: corneal reflex: present VIII: patient does not respond to verbal stimuli IX,X: gag reflex present XI: trapezius strength unable to test bilaterally XII: tongue strength unable to test Motor: minimal withdrawal in bilateral upper extremities, no withdrawal in RLE Deep Tendon Reflexes:  Absent throughout. Plantar: R plantar mute Cerebellar: Unable to perform   Reviewed CCM note, plan appears to be with palliative extubation. Neurology will be available as needed.

## 2019-01-25 NOTE — Progress Notes (Signed)
NAME:  Daniel Moon, MRN:  [id number], DOB:  22-Feb-1960, LOS: 60 ADMISSION DATE:  12/20/2018, CONSULTATION DATE:  12/27 REFERRING MD:  Mary[place] Pink, CHIEF COMPLAINT:  dyspnea   Brief History   59 y/o male with extensive past medical history admitted on 12/21 with COVID pneumonia and CHF exacerbation.  Had a cardiac arrest on 12/27 in the setting of likely aspiration/inability to clear secretions.  Past Medical History  Afib Polysubstance abuse Paroxysmal atrial fib L BKA stump infection with MRSA DM2 COPD  Significant Hospital Events   12/21 admission 12/27 cardiac arrest, intubation 1/5: Still not responding.  Has been on no sedation for several days. They concur about severe brain injury and suspect sig debilitation as consequence of arrest.  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  Bilateral thalamic injury consistent with anoxia.  Heparin stopped as patient bit tongue and was bleeding excessively 1/7: Spoke with son and daughter, DNR established, family planning on coming on 1/9 Micro Data:  12/21 blood > negative Respiratory culture 12/27 >> few Candida albicans  Antimicrobials:  12.21 remdesivir > 12/26 12/22 azithro > 12/29 12/22 ceftriaxone > 12/29 12/22 tocilizumab Unasyn 12/29 >> 1/3  Interim history/subjective:  same  Objective   Blood pressure (Abnormal) 141/97, pulse (Abnormal) 104, temperature 98.8 F (37.1 C), temperature source Oral, resp. rate (Abnormal) 25, height 6\' 3"  (1.905 m), weight 64.8 kg, SpO2 97 %.    Vent Mode: PRVC FiO2 (%):  [30 %] 30 % Set Rate:  [20 bmp] 20 bmp Vt  Set:  [500 mL] 500 mL PEEP:  [5 cmH20] 5 cmH20 Pressure Support:  [8 cmH20] 8 cmH20 Plateau Pressure:  [18 cmH20-21 cmH20] 18 cmH20   Intake/Output Summary (Last 24 hours) at 01/25/2019 1058 Last data filed at 01/25/2019 0900 Gross per 24 hour  Intake 770 ml  Output 500 ml  Net 270 ml   Filed Weights   01/23/19 0355 01/24/19 0500 01/25/19 0500  Weight: 64.1 kg 64.8 kg 64.8 kg    Examination: General remains vegetative, still on pressure support ventilation, work of breathing a little worse today HEENT normocephalic orally intubated Pulmonary: Scattered rhonchi some accessory use today ps10->increased to 12 VTs 400-500 rr 20s  Cardiac regular rate and rhythm Abdomen soft nontender Extremities are warm dry dependent edema Neuro eyes open, does not follow commands, still has cough  Resolved Hospital Problem list   Cardiac arrest secondary to hypoxemia and ineffective airway clearance HCAP Assessment & Plan:   COVID 19 pneumonia located by aspiration pneumonia Right lung atelectasis, mucus plugging He remains ventilator dependent due to mental status Has completed remdesivir and steroids Plan Continue pressure support ventilation VAP bundle  Oral bleeding, had a tongue laceration but persistent source appears to be from poor dentition and loose tooth Plan No longer candidate for anticoagulation  Acute  on chronic systolic heart failure, cardiogenic shock post cardiac arrest, hemodynamically improved Prior ejection fraction 20 to 25%. Plan Continue telemetry monitoring  Acute toxic and metabolic encephalopathy secondary to what appears to be anoxic brain injury post cardiac arrest further complicated by alcohol withdrawal, and metabolic derangements -Seen by neurology, concur about anoxic injury, anticipate  high-level of severe disability Plan We will add as needed morphine for comfort Planning on one-way extubation 1/9  Atrial fib.history of LV thrombus Plan Continue  telemetry  AKI  Creatinine improved, sodium now lower Hyperkalemia, improved but remains elevated -still neg volume status Plan No change in free water  Thrombocytopenia Plan Limiting labs  Hypertension Plan Continue amlodipine  Hyperglycemia  Plan  Continue current glycemic control  nutrition Plan Continue tube feeds  Best practice:  Diet: tube feeding Pain/Anxiety/Delirium protocol (if indicated): yes VAP protocol (if indicated): yes DVT prophylaxis: SCD GI prophylaxis: famotidine Glucose control: SSI Mobility: bed rest Code Status: DNR Family Communication:   son Dorna Bloom ACNP-BC [place] Pager # [phone] OR # [phone] if no answer

## 2019-01-26 LAB — GLUCOSE, CAPILLARY
Glucose-Capillary: 103 mg/dL — ABNORMAL HIGH (ref 70–99)
Glucose-Capillary: 134 mg/dL — ABNORMAL HIGH (ref 70–99)
Glucose-Capillary: 101 mg/dL — ABNORMAL HIGH (ref 70–99)
Glucose-Capillary: 193 mg/dL — ABNORMAL HIGH (ref 70–99)

## 2019-01-26 MED ORDER — POLYVINYL ALCOHOL 1.4 % OP SOLN
1.0000 [drp] | Freq: Four times a day (QID) | OPHTHALMIC | Status: DC | PRN
  Filled 2019-01-26: qty 15

## 2019-01-26 MED ORDER — MORPHINE SULFATE (PF) 2 MG/ML IV SOLN: 2.0000 mg | INTRAVENOUS | Status: DC | PRN

## 2019-01-26 MED ORDER — GLYCOPYRROLATE 0.2 MG/ML IJ SOLN: 0.2000 mg | INTRAMUSCULAR | Status: DC | PRN

## 2019-01-26 MED ORDER — ACETAMINOPHEN 325 MG PO TABS: 650.0000 mg | ORAL_TABLET | Freq: Four times a day (QID) | ORAL | Status: DC | PRN

## 2019-01-26 MED ORDER — ACETAMINOPHEN 650 MG RE SUPP: 650.0000 mg | Freq: Four times a day (QID) | RECTAL | Status: DC | PRN

## 2019-01-26 MED ORDER — MORPHINE BOLUS VIA INFUSION
5.0000 mg | INTRAVENOUS | Status: DC | PRN
  Administered 2019-01-26: 5 mg via INTRAVENOUS
  Filled 2019-01-26: qty 5

## 2019-01-26 MED ORDER — MORPHINE 100MG IN NS 100ML (1MG/ML) PREMIX INFUSION
0.0000 mg/h | INTRAVENOUS | Status: DC
  Administered 2019-01-26: 5 mg/h via INTRAVENOUS
  Administered 2019-01-26 – 2019-01-29 (×7): 10 mg/h via INTRAVENOUS
  Filled 2019-01-26 (×8): qty 100

## 2019-01-26 MED ORDER — GLYCOPYRROLATE 1 MG PO TABS
1.0000 mg | ORAL_TABLET | ORAL | Status: DC | PRN
  Filled 2019-01-26 (×2): qty 1

## 2019-01-26 MED ORDER — GLYCOPYRROLATE 0.2 MG/ML IJ SOLN
0.2000 mg | INTRAMUSCULAR | Status: DC | PRN
  Administered 2019-01-26: 0.2 mg via INTRAVENOUS
  Filled 2019-01-26: qty 1

## 2019-01-26 MED ORDER — DIPHENHYDRAMINE HCL 50 MG/ML IJ SOLN: 25.0000 mg | INTRAMUSCULAR | Status: DC | PRN

## 2019-01-26 MED ORDER — MIDAZOLAM HCL 2 MG/2ML IJ SOLN: 2.0000 mg | INTRAMUSCULAR | Status: DC | PRN

## 2019-01-26 NOTE — Progress Notes (Signed)
Pt terminally extubated to room air.

## 2019-01-26 NOTE — Progress Notes (Signed)
Pts belongings - clothing & shoes - given to [place] to return to family.

## 2019-01-26 NOTE — Progress Notes (Signed)
[facility] attempted to connect family w/ video chat and family was unable to connect with text or email.

## 2019-01-26 NOTE — Progress Notes (Signed)
NAME:  Daniel Moon, MRN:  [id number], DOB:  1960/09/19, LOS: 85 ADMISSION DATE:  12/21/2018, CONSULTATION DATE:  12/27 REFERRING MD:  [place] Pink, CHIEF COMPLAINT:  dyspnea   Brief History   59 y/o male with extensive past medical history admitted on 12/21 with COVID pneumonia and CHF exacerbation.  Had a cardiac arrest on 12/27 in the setting of likely aspiration/inability to clear secretions.  Past Medical History  Afib Polysubstance abuse Paroxysmal atrial fib L BKA stump infection with MRSA DM2 COPD  Significant Hospital Events   12/21 admission 12/27 cardiac arrest, intubation 1/5: Still not responding.  Has been on no sedation for several days. They concur about severe brain injury and suspect sig debilitation as consequence of arrest.  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  Bilateral thalamic injury consistent with anoxia.  Heparin stopped as patient bit tongue and was bleeding excessively 1/7: Spoke with son and daughter, DNR established, family planning on coming on 1/9 Micro Data:  12/21 blood > negative Respiratory culture 12/27 >> few Candida albicans  Antimicrobials:  12.21 remdesivir > 12/26 12/22 azithro > 12/29 12/22 ceftriaxone > 12/29 12/22 tocilizumab Unasyn 12/29 >> 1/3  Interim history/subjective:  Intubated on mechanical life support unresponsive  Objective   Blood pressure (!) 135/95, pulse 98, temperature 99.8 F (37.7 C), temperature source Oral, resp. rate 15, height 6\' 3"  (1.905 m), weight 63.5 kg, SpO2 100 %.    Vent Mode: PRVC FiO2 (%):  [30 %] 30 % Set Rate:  [20  bmp] 20 bmp Vt Set:  [500 mL] 500 mL PEEP:  [5 cmH20-8 cmH20] 5 cmH20 Pressure Support:  [12 cmH20] 12 cmH20 Plateau Pressure:  [19 cmH20-21 cmH20] 20 cmH20   Intake/Output Summary (Last 24 hours) at 01/26/2019 0830 Last data filed at 01/26/2019 0700 Gross per 24 hour  Intake 1810 ml  Output 1050 ml  Net 760 ml   Filed Weights   01/24/19 0500 01/25/19 0500 01/26/19 0440  Weight: 64.8 kg 64.8 kg 63.5 kg    Examination: General patient remains intubated on mechanical support, persistent state of an wakefulness, no response to painful stimuli no response to environmental stimulus HEENT NCAT, intubated on mechanical support endotracheal tube in place Pulmonary: Bilateral ventilated breath sounds Cardiac regular rate rhythm, S1-S2 Abdomen soft, nontender nondistended Extremities no edema Neuro does not follow commands, unable to obtain gag reflex, internal rotation of the arm to pain.  However only minimal  Resolved Hospital Problem list   Cardiac arrest secondary to hypoxemia and ineffective airway clearance HCAP  Assessment & Plan:   COVID 19 pneumonia located by aspiration pneumonia Right lung atelectasis, mucus plugging He remains ventilator dependent due to mental status Has completed remdesivir and steroids Plan Remains on vent support Planned palliative liberation from mechanical support today after family visit.  Oral bleeding, had a tongue laceration but persistent source appears to be from poor dentition and loose tooth Plan No longer candidate for anticoagulation  Acute  on chronic systolic heart failure, cardiogenic shock post cardiac arrest, hemodynamically improved Prior ejection fraction 20 to 25%. Plan Telemetry  Acute toxic and metabolic encephalopathy secondary to what appears to  be anoxic brain injury post cardiac arrest further complicated by alcohol withdrawal, and metabolic derangements -Seen by neurology, concur about anoxic injury, anticipate  high-level of severe disability Plan As needed morphine for comfort. Plan one-way extubation today. Discussed this again via phone with the patient's son. Once liberated from mechanical support.  Comfort will be only goal. Withdrawal of life-sustaining treatment order set for critically ill will be placed.  Atrial fib.history of LV thrombus Plan Remains on telemetry  AKI  Creatinine improved, sodium now lower Hyperkalemia, improved but remains elevated -still neg volume status Plan No change in free water  Thrombocytopenia Plan Limiting labs  Hypertension Plan Continue amlodipine  Hyperglycemia  Plan  Comfort care  nutrition Plan Comfort care  Best practice:  Diet: tube feeding Pain/Anxiety/Delirium protocol (if indicated): yes VAP protocol (if indicated): yes DVT prophylaxis: SCD GI prophylaxis: famotidine Glucose control: SSI Mobility: bed rest Code Status: DNR Family Communication:   son Cletis Media, DO [facility] 01/26/2019 8:30 AM

## 2019-01-26 NOTE — Progress Notes (Signed)
Pt taken to family visit room on ventilator. Pt returned to roo without incident

## 2019-01-27 NOTE — Progress Notes (Signed)
S: 59 y/o male with extensive past medical history admitted on 12/21 with COVID pneumonia and CHF exacerbation.  Had a cardiac arrest on 12/27 in the setting of likely aspiration/inability to clear secretions, with anoxic brain injury. Has not regained consciousness. Family agreed to comfort care and was one-way extubated 1/9.   O: rectal tube and foley with modest accumulation,  Unresponsive to stim HR ~60 Chest excursion   A: COVID, cardiac arrest with anoxic brain injury P: continue comfort care  Bonna Gains MD, PhD 01/27/19

## 2019-01-27 NOTE — Progress Notes (Addendum)
Family member "Isamar Brumfield" called to inquire about patient visitation and patient information. RN unable to speak with her personally as RN was in a patient room. A coworker did let the caller know that family has already visited and only one visit for end of life is permitted. RN tried to return the phone call around 0200 without a response. Additionally, said person is not listed under family contacts, so per the Charge RN, Daniel Moon would need to give hospital permission for her to speak to staff regarding pt information or facetime patient.   UPDATE 1/10 2030: [name] called again this evening trying to get information on pt. The above was repeated to her multiple times. Charge RN also spoke with family member. [name] is aware that Daniel Morgans needs to call staff to give permission for staff to talk to her.

## 2019-01-27 NOTE — Progress Notes (Signed)
   01/27/19 1200  Family/Significant Other Communication  Family/Significant Other Update Called;Updated (called updated son )

## 2019-01-28 NOTE — Progress Notes (Signed)
Morphine drip -new bag hung.. 23ml wasted in sink with Vickie Garner,RN.

## 2019-01-28 NOTE — Progress Notes (Signed)
This nurse witnessed 1mg /ml Morphine waste for this patient with Kennyth Lose, Therapist, sports.

## 2019-01-28 NOTE — Progress Notes (Signed)
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  Suffered cardiac arrest on 12/27 in the setting of aspiration / inability to clear secretions. MRI on 12/30 showed bilateral thalamic injury consistent with anoxic injury.  Neurology evaluated the patient and felt this to be a severely debilitating injury.  He was made DNR by family.  Patient remained intubated from 12/27 >> 1/9 with plan for comfort measures.      O: General: thin adult male lying in bed in NAD  HEENT: MM pink/dry, no jvd Neuro: eyes closed / appears comfortable  CV: s1s2 rrr, no m/r/g PULM:  Non-labored, lungs distant bilaterally  GI: soft, bsx4 active  Extremities: warm/dry, L BKA, no edema  Skin: no rashes or lesions   A: COVID PNA Cardiac Arrest with Anoxic Brain Injury  Acute on Chronic Systolic CHF Cardiogenic Shock Post Arrest  Acute Toxic & Metabolic Encephalopathy  ETOH Withdrawal  AF  Hx LV Thrombus  AKI  Hyperkalemia  Thrombocytopenia  HTN Hyperglycemia   P: Continue comfort measures Titrate Morphine gtt for normal respiratory rate / no work of breathing  PRN Robinul for secretions PRN zofran  DNR     Noe Gens, MSN, NP-C [facility] 01/28/2019, 11:49 AM   Please see [email or web address] for pager details.

## 2019-01-28 NOTE — Progress Notes (Signed)
Nutrition Brief Note RD working remotely. Chart reviewed. Pt now transitioning to comfort care.  No further nutrition interventions warranted at this time.  Please re-consult as needed.   Dariona Postma A. Claud Gowan, RD, LDN, CDCES Registered Dietitian II Certified Diabetes Care and Education Specialist Pager: [phone] After hours Pager: [phone]  

## 2019-02-18 NOTE — Plan of Care (Signed)
Patient is actively dying. Comatose.

## 2019-02-18 NOTE — Progress Notes (Signed)
This nurse witnessed 40 mg/ml Morphine waste with Kennyth Lose, Therapist, sports.

## 2019-02-18 NOTE — Progress Notes (Signed)
Daniel Moon was released by [facility]. Ref. # [id number] by Marybelle Killings.

## 2019-02-18 NOTE — Progress Notes (Signed)
Body prepared after passing. Body cleansed, condom cath, rectal tube, TLC and PIV removed. Dressings to buttock and rt lower leg removed. Transported with staff to morgue. Patient to go to [employer] funeral home per son [name].

## 2019-02-18 NOTE — Progress Notes (Signed)
Morphine gtt-4ml wasted in sink after patient deceased witnessed with Emmit Pomfret, RN

## 2019-02-18 NOTE — Progress Notes (Signed)
RN went to check on patient at 1445 and found patient without breath. Very faint pulse. RN and another Buyer, retail in attendance when pulse stopped. Patient without lung sounds, heart sounds, and has absence of pulse and respirations. Pronounced at 1450. Dr. Sherral Hammers, John RN CN, and son Lonnie Leaks notified. Son to call back with funeral home information.

## 2019-02-18 NOTE — Death Summary Note (Signed)
Death Summary  Daniel Moon [id number] DOB: 08-23-1960 DOA: Jan 14, 2019  PCP: Sanjuana Letters, MD PCP/Office notified: No  Admit date: 2019/01/14 Date of Death: 2019/02/05  Final Diagnoses:  Principal Problem:   Acute respiratory failure with hypoxia ([place]) Active Problems:   HTN (hypertension)   Diabetes ([place])   COPD (chronic obstructive pulmonary disease) ([facility])   PAD (peripheral artery disease) ([facility])   Chronic renal insufficiency, stage 3 (moderate)   Paroxysmal atrial fibrillation ([place])   COVID-19 virus infection   Polysubstance abuse ([place])   Hypoglycemia   Anoxic brain injury ([place])   Ventilator dependence ([place])    COVID PNA Cardiac Arrest with Anoxic Brain Injury  Acute on Chronic Systolic CHF Cardiogenic Shock Post Arrest  Acute Toxic &Metabolic Encephalopathy  ETOH Withdrawal  AF  Hx LV Thrombus  AKI  Hyperkalemia  Thrombocytopenia  HTN Hyperglycemia    Goals of care -Comfort care -02/04/2022 consult palliative care; home hospice vs inpatient hospice   History of present illness:  59 y/o BM with PMHx of AF, Polysubstance Abuse, DM II, COPD, 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 Suffered cardiac arrest on 12/27 in the setting of aspiration / inability to clear secretions. MRI on 12/30 showed bilateral thalamic injury consistent with anoxic injury. Neurology evaluated the patient and felt this to be a severely debilitating injury. He was made DNR by family. Patient remained intubated from 12/27 >>1/9 with plan for comfort measures.      Time: 1450 Signed:  Dia Crawford, MD [facility] [phone]

## 2019-02-18 NOTE — Progress Notes (Signed)
Daniel Moon called trying to get information on patient but she is not listed as a person we can speak with. I have left a message with son Daniel Moon to return call and give permission to speak with Daniel Moon. No return call at this time.

## 2019-02-18 DEATH — deceased

## 2020-09-21 IMAGING — DX DG FOOT COMPLETE 3+V*L*
3 series · 3 of 3 positions shown · non-contrast
Comparison: Radiograph 07/16/2018

CLINICAL DATA: Left foot pain. History of partial amputation.

EXAM:
LEFT FOOT - COMPLETE 3+ VIEW

[foot obl]
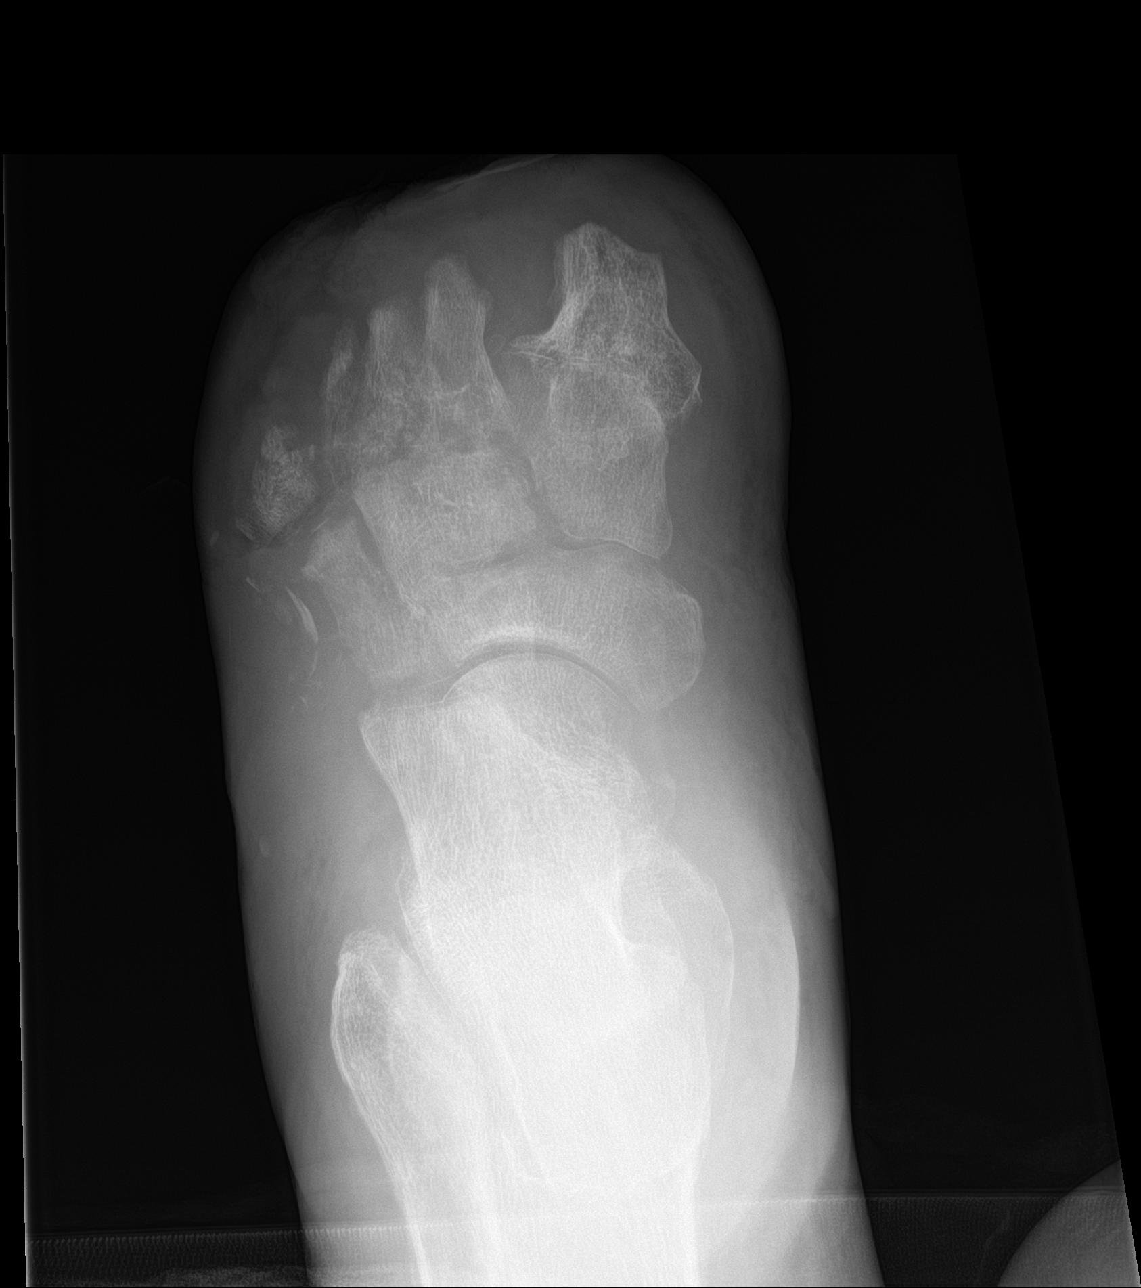

[foot lat]
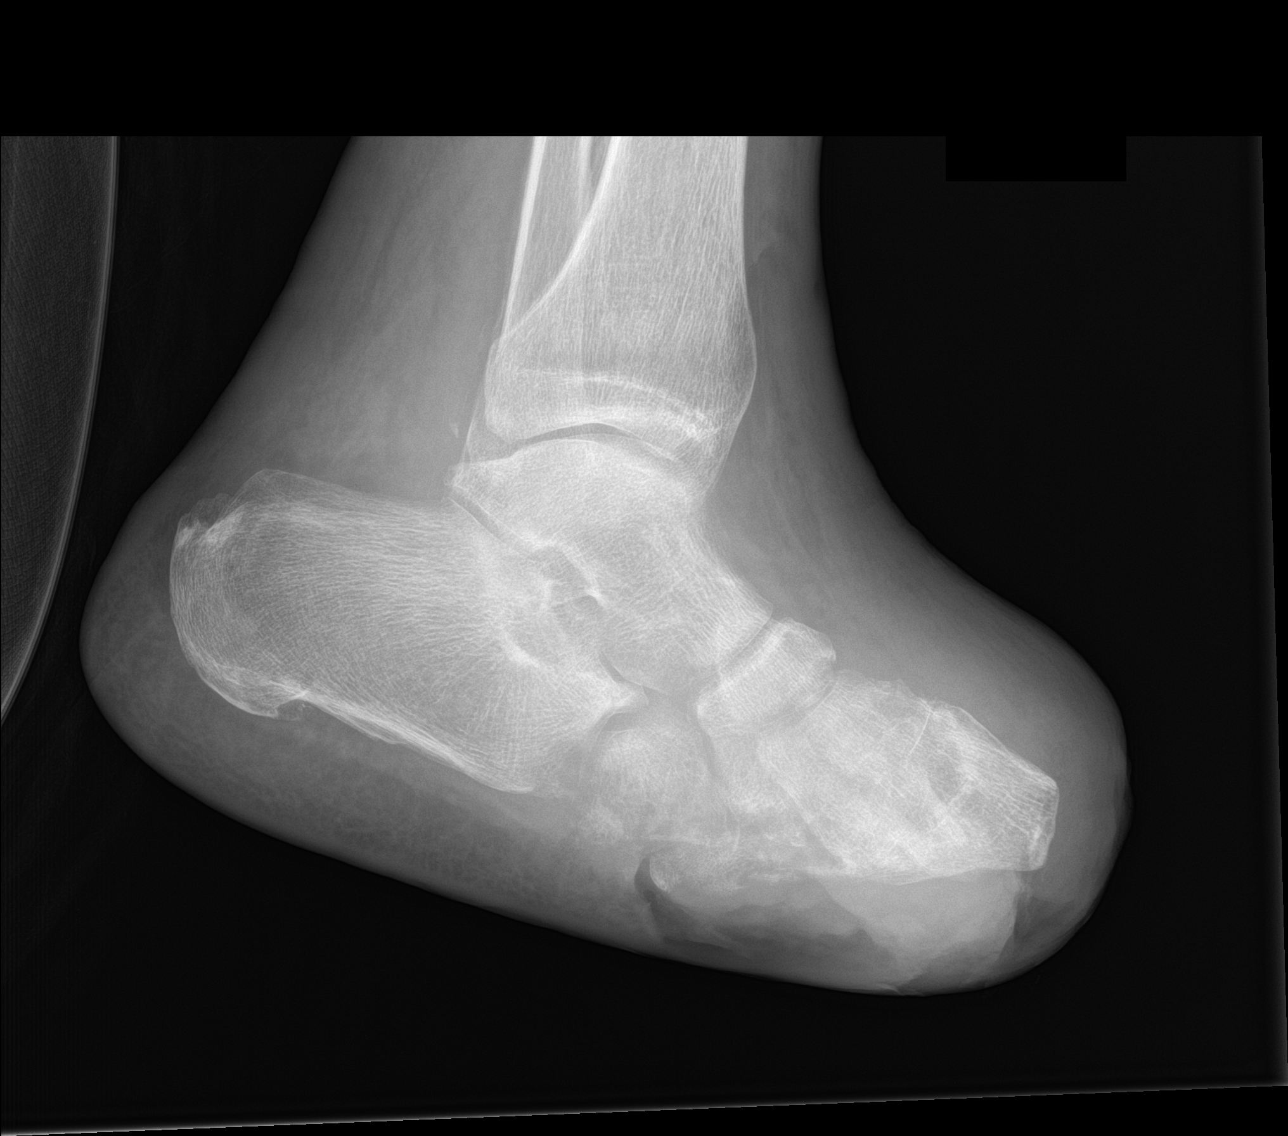

[foot ap]
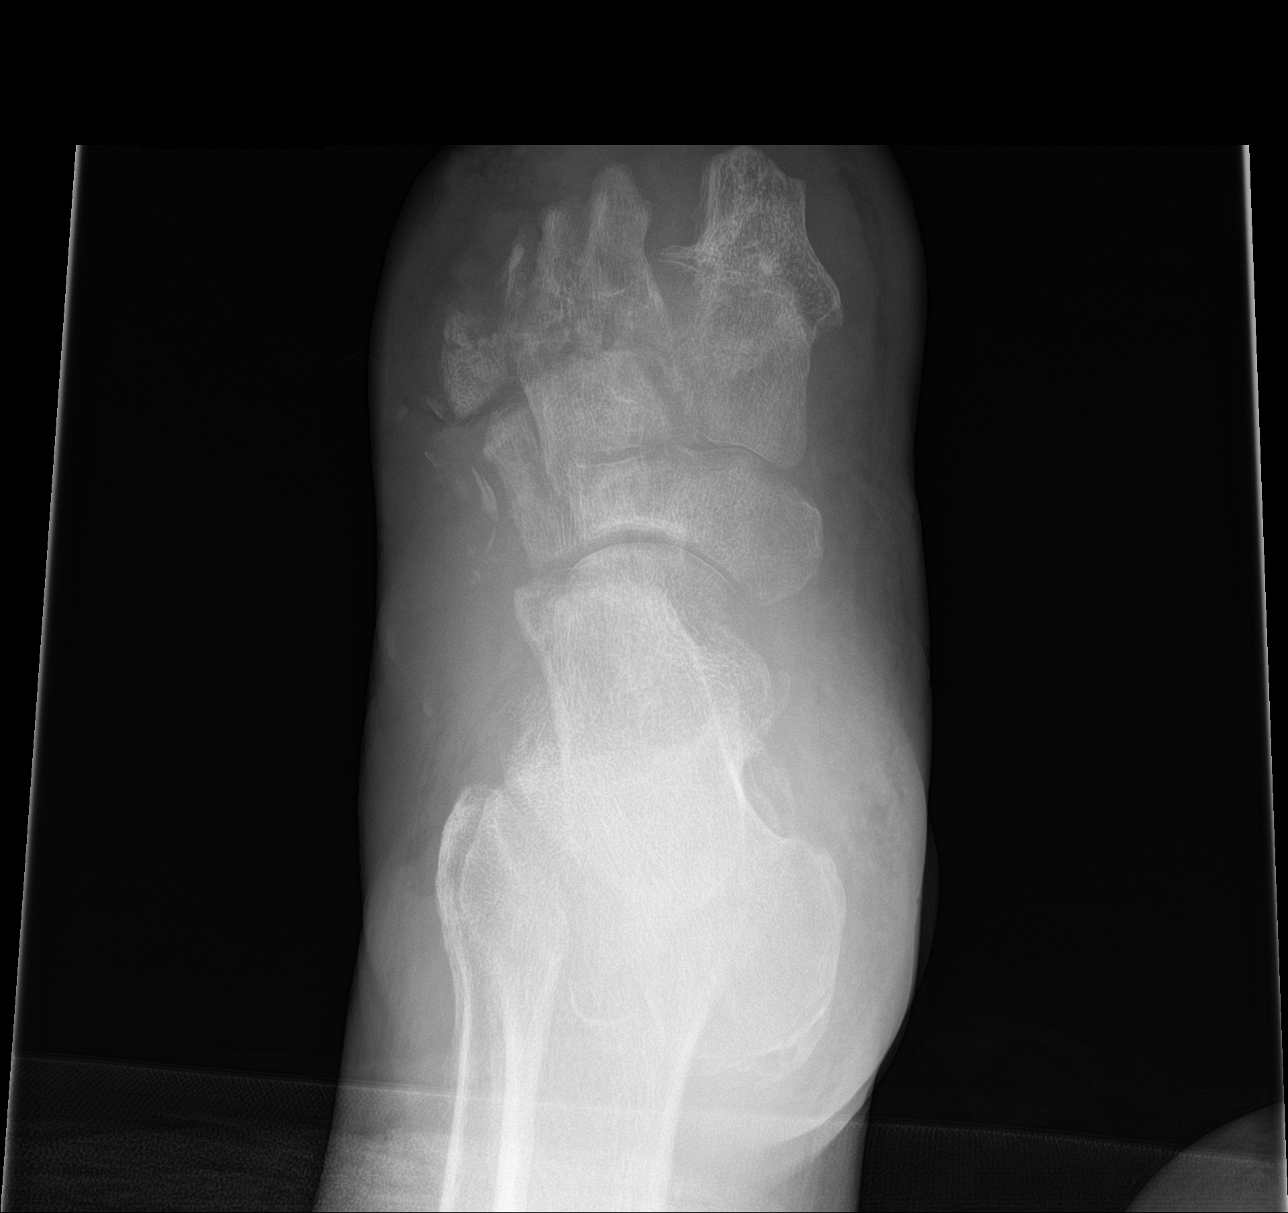

[3 of 3 positions shown; findings below may reference images not displayed]

FINDINGS: Prior transmetatarsal amputation. Soft tissue defect about the
plantar lateral aspect of the foot. Osseous resorption with bony
destructive change involving the in situ second through fifth
metatarsals, progressed from prior exam. Interval bony destruction
involving the cuboid with fragmentation laterally. Nonspecific
decreased density involving the anterior cortex of the calcaneus.
Generalized soft tissue edema.
IMPRESSION: Prior transmetatarsal amputation with osteomyelitis of the in situ
second through fifth metatarsals as well as the cuboid. Soft tissue
defect overlies the plantar lateral aspect of the foot. Generalized
soft tissue edema.

## 2020-09-21 IMAGING — DX DG CHEST 1V PORT
1 series · 1 of 1 positions shown · non-contrast
Comparison: Radiograph 07/29/2018, 02/22/2018

CLINICAL DATA: Suspected sepsis.

EXAM:
PORTABLE CHEST 1 VIEW

[chest ap]
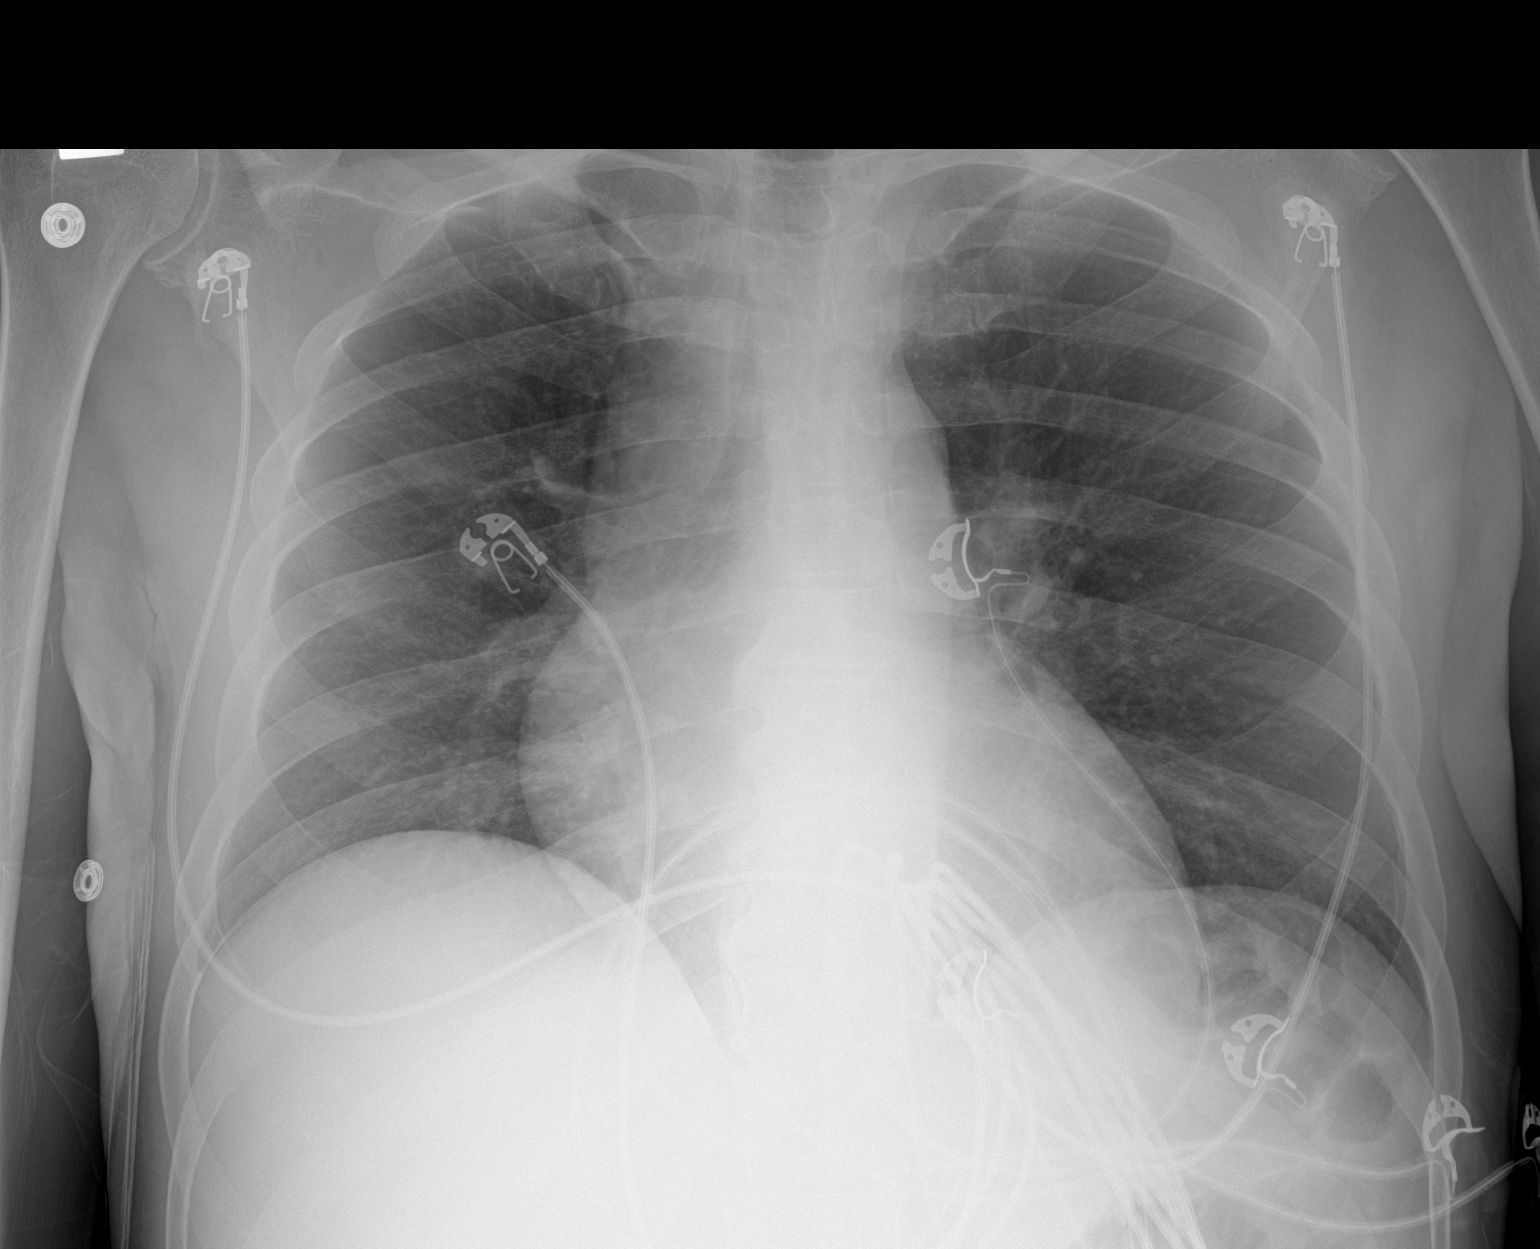

[1 of 1 positions shown; findings below may reference images not displayed]

FINDINGS: Improved aeration from prior exams. Mild cardiomegaly with unchanged
mediastinal contours. No acute airspace disease. No pulmonary edema,
pleural fluid, or pneumothorax. No acute osseous abnormalities.
IMPRESSION: Mild cardiomegaly without acute abnormality.

## 2020-12-30 IMAGING — CT CT CHEST W/O CM
2 of 4 series · 14 of 36 positions shown, 17 images · non-contrast
Comparison: Chest radiograph January 07, 2019

CLINICAL DATA: Shortness of breath.  [id number] positive

EXAM:
CT CHEST WITHOUT CONTRAST
TECHNIQUE: Multidetector CT imaging of the chest was performed following the
standard protocol without IV contrast.

[Series 4: thorax 2.0 · axial · 0.72mm/px · z∈[-166,+108]mm · 11 of 155 slices shown, 14 images]
[im 9/155  mediastinal]
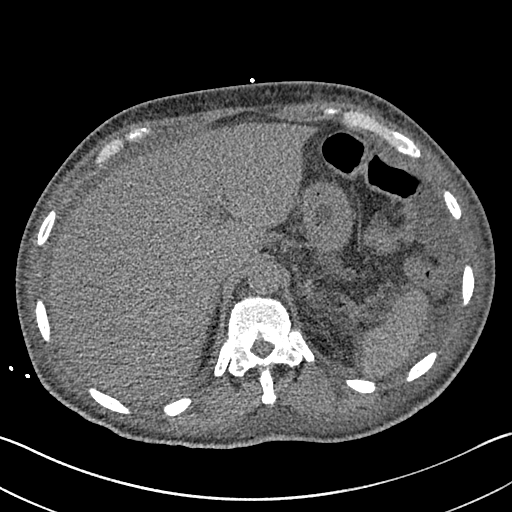
[im 9/155  lung]
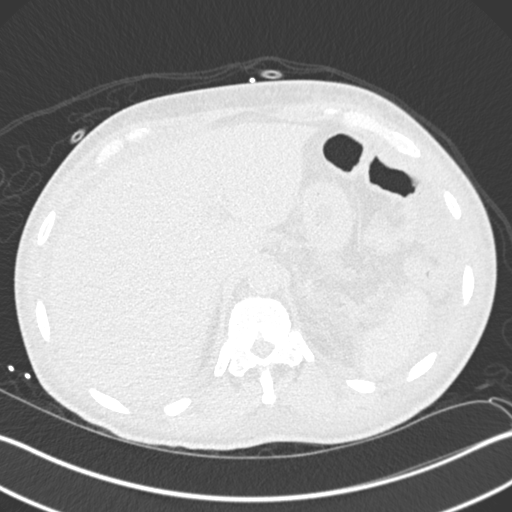
[im 25/155  lung]
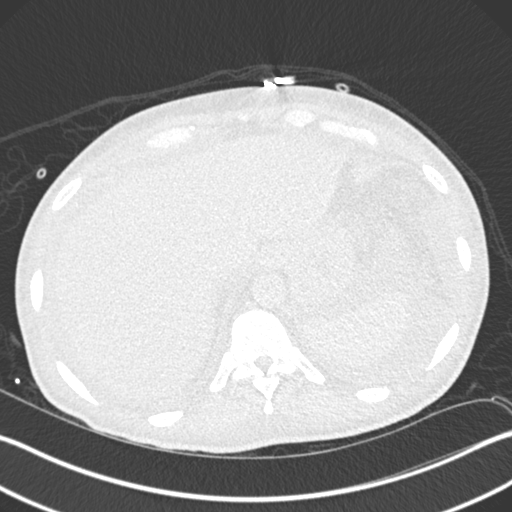
[im 41/155  lung]
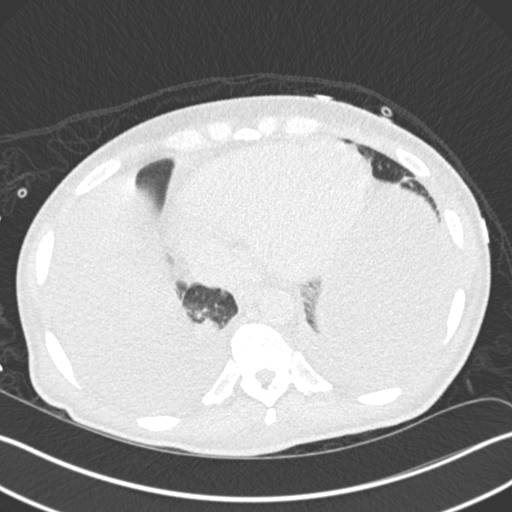
[im 49/155  lung]
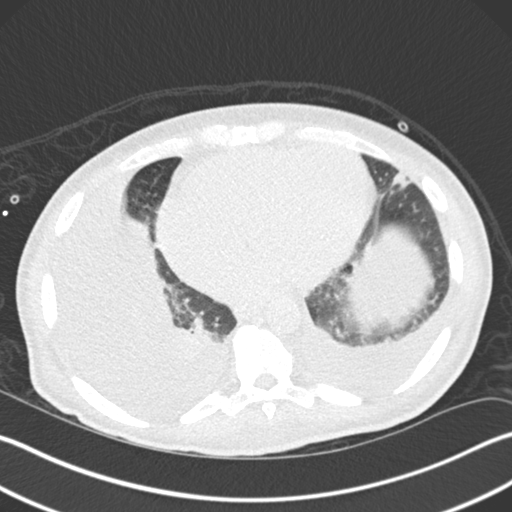
[im 65/155  mediastinal]
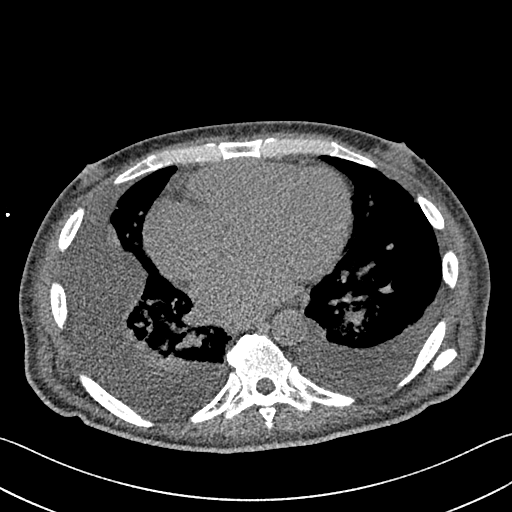
[im 65/155  lung]
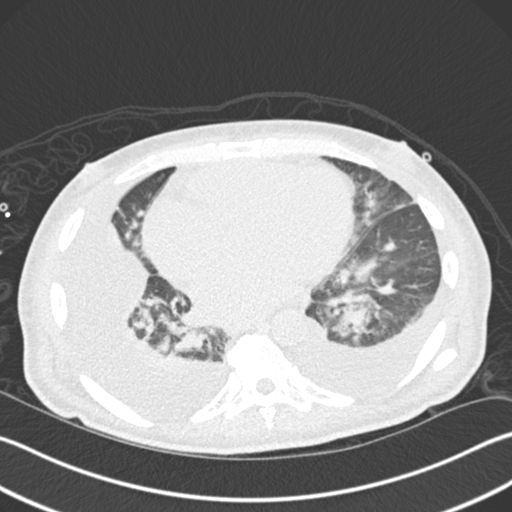
[im 82/155  lung]
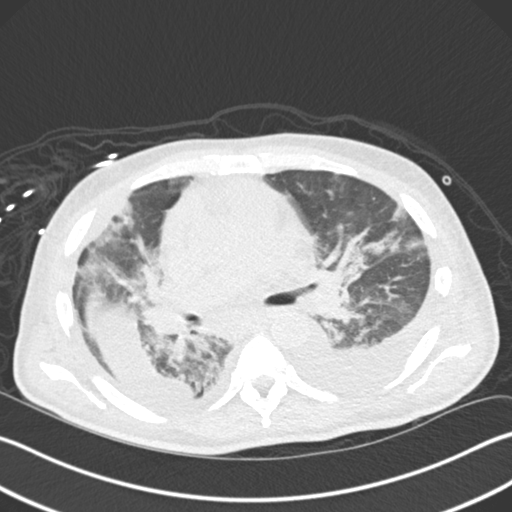
[im 90/155  lung]
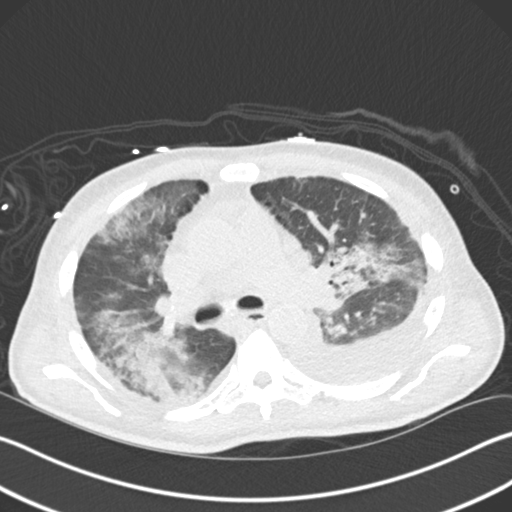
[im 106/155  lung]
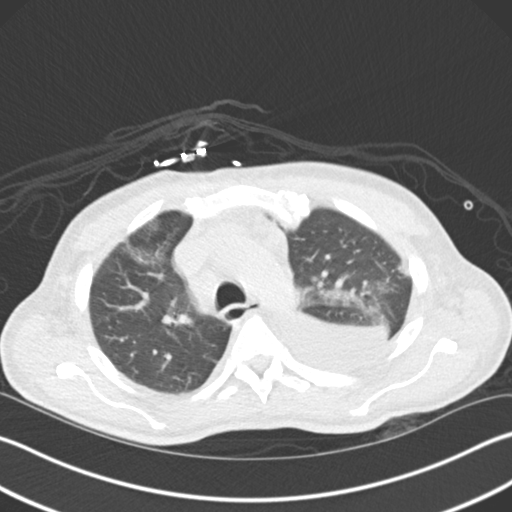
[im 114/155  mediastinal]
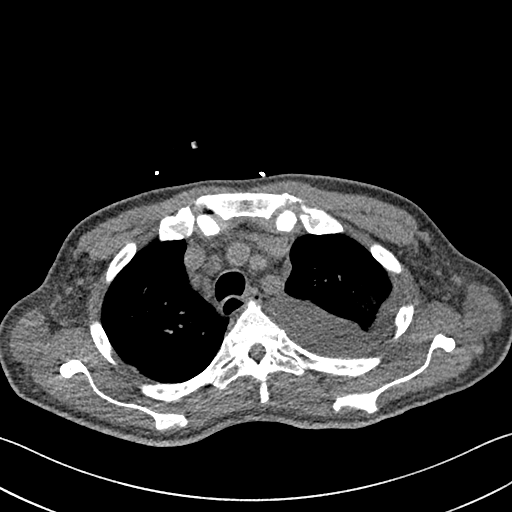
[im 114/155  lung]
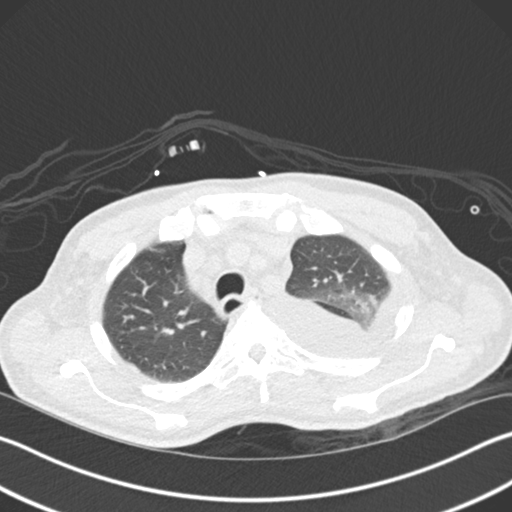
[im 130/155  lung]
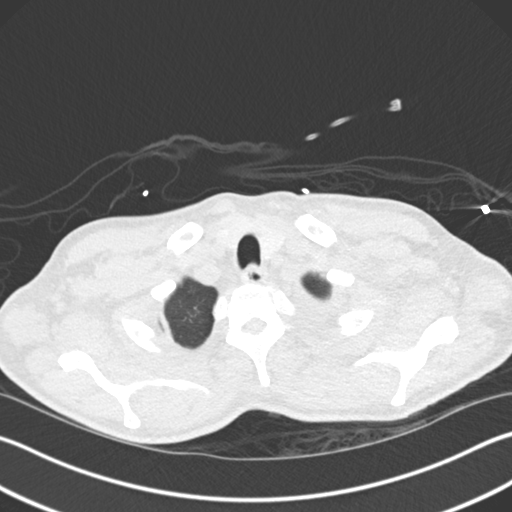
[im 146/155  lung]
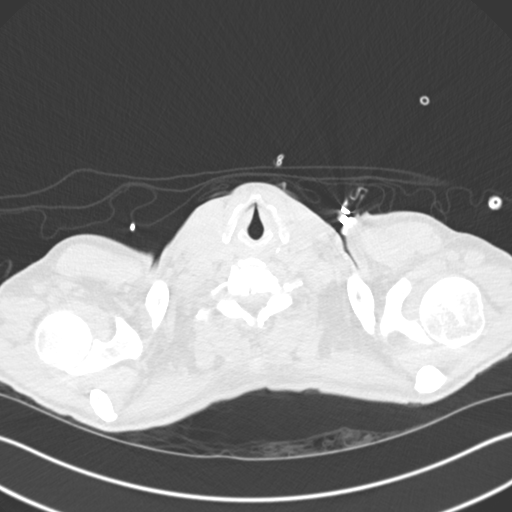

[Series 6: coronal · coronal · 0.60mm/px · 3 of 101 slices shown]
[im 21/101  lung]
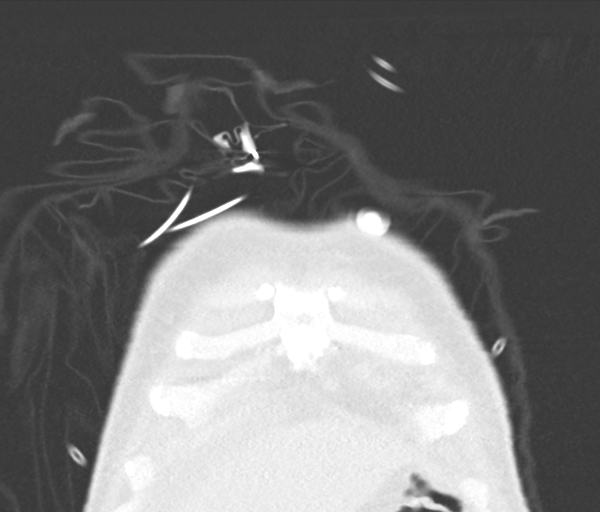
[im 41/101  lung]
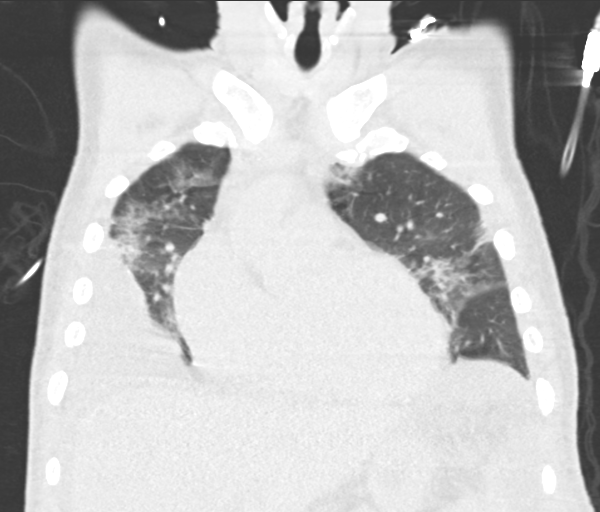
[im 61/101  lung]
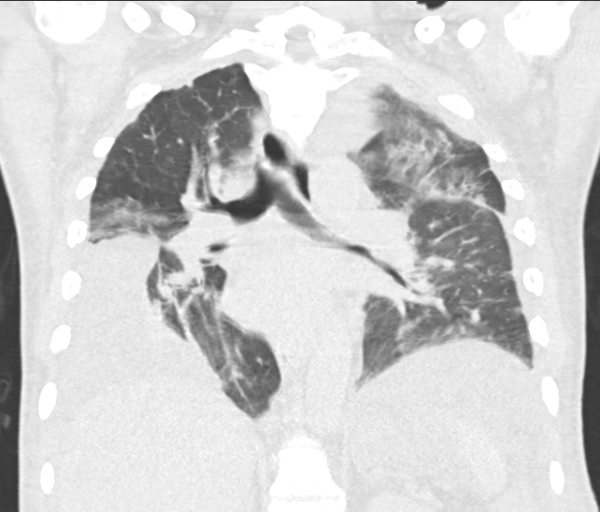

[14 of 36 positions shown; findings below may reference images not displayed]

FINDINGS: Cardiovascular: There is no thoracic aortic aneurysm on this
noncontrast enhanced study. There are scattered foci of
calcification in visualized great vessels. There are foci of aortic
atherosclerosis. There are occasional foci of coronary artery
calcification. There is no pericardial effusion or pericardial
thickening evident.

The main pulmonary outflow tract is prominent, measuring 3.3 cm in
diameter.

Mediastinum/Nodes: Thyroid appears unremarkable. There are scattered
prominent mediastinal lymph nodes. There is a lymph node anterior to
the carina. On the right measuring 1.5 x 1.4 cm. There is a lymph
node adjacent to the aortic arch on the left measuring 1.5 x 1.3 cm.
A second lymph node adjacent to the aortic arch measures 1.5 x
cm. There is a lymph node in the aortopulmonary window region
measuring 1.6 x 1.3 cm. There is a subcarinal lymph node measuring
1.5 x 1.5 cm. No esophageal lesions are evident.

Lungs/Pleura: There are moderate free-flowing pleural effusions
bilaterally. There is mild loculation in the right pleural effusion.
There is compressive atelectasis in each lower lobe.

There is widespread airspace opacity throughout the lungs
bilaterally, with consolidation in a portion of the posterior
segment left upper lobe. Other areas of opacity have a more
ground-glass type appearance.

Upper Abdomen: There is a degree of hepatic steatosis. Visualized
upper abdominal structures otherwise appear unremarkable.

Musculoskeletal: There is degenerative change in the thoracic spine.
There are no blastic or lytic bone lesions. No chest wall lesions
are evident.
IMPRESSION: 1. Multifocal airspace disease bilaterally, primarily with
ground-glass type opacity, although there is consolidation in a
portion of the posterior segment of the left upper lobe. Suspect
atypical organism pneumonia at least partially responsible for the
findings.

2. Moderate pleural effusions bilaterally, slightly larger on the
right than on the left with mild loculation on the right. Most of
the effusions bilaterally are free-flowing. There is compressive
atelectasis in each posterior lung base.

3. Several enlarged lymph nodes of uncertain etiology. Adenopathy
may be of reactive/inflammatory etiology secondary to the extensive
abnormality in the lung parenchyma.

4. Enlargement of the main pulmonary outflow tract, a finding
indicative of underlying pulmonary arterial hypertension.

5.  Hepatic steatosis.

6. Foci of aortic atherosclerosis as well as great vessel and
coronary artery calcification.

Aortic Atherosclerosis ([id number]).
